# Patient Record
Sex: Female | Born: 1955 | Race: White | Hispanic: No | Marital: Single | Smoking: Former smoker
Health system: Southern US, Community
[De-identification: ages and names within clinical notes are randomized; demographics above are authoritative.]

## PROBLEM LIST (undated history)

## (undated) DIAGNOSIS — Z8489 Family history of other specified conditions: Secondary | ICD-10-CM

## (undated) DIAGNOSIS — D369 Benign neoplasm, unspecified site: Secondary | ICD-10-CM

## (undated) DIAGNOSIS — I10 Essential (primary) hypertension: Secondary | ICD-10-CM

## (undated) DIAGNOSIS — M545 Low back pain, unspecified: Secondary | ICD-10-CM

## (undated) DIAGNOSIS — J939 Pneumothorax, unspecified: Secondary | ICD-10-CM

## (undated) DIAGNOSIS — Q8501 Neurofibromatosis, type 1: Secondary | ICD-10-CM

## (undated) DIAGNOSIS — H269 Unspecified cataract: Secondary | ICD-10-CM

## (undated) DIAGNOSIS — T7840XA Allergy, unspecified, initial encounter: Secondary | ICD-10-CM

## (undated) DIAGNOSIS — C801 Malignant (primary) neoplasm, unspecified: Secondary | ICD-10-CM

## (undated) DIAGNOSIS — D3A098 Benign carcinoid tumors of other sites: Secondary | ICD-10-CM

## (undated) HISTORY — DX: Benign carcinoid tumors of other sites: D3A.098

## (undated) HISTORY — PX: CHOLECYSTECTOMY: SHX55

## (undated) HISTORY — DX: Low back pain: M54.5

## (undated) HISTORY — PX: WHIPPLE PROCEDURE: SHX2667

## (undated) HISTORY — DX: Neurofibromatosis, type 1: Q85.01

## (undated) HISTORY — PX: LASIK: SHX215

## (undated) HISTORY — DX: Unspecified cataract: H26.9

## (undated) HISTORY — DX: Benign neoplasm, unspecified site: D36.9

## (undated) HISTORY — PX: LUNG SURGERY: SHX703

## (undated) HISTORY — DX: Low back pain, unspecified: M54.50

## (undated) HISTORY — PX: OOPHORECTOMY: SHX86

## (undated) HISTORY — PX: TONSILLECTOMY: SUR1361

## (undated) HISTORY — DX: Allergy, unspecified, initial encounter: T78.40XA

---

## 1997-04-23 HISTORY — PX: ABDOMINAL HYSTERECTOMY: SHX81

## 1997-07-30 ENCOUNTER — Inpatient Hospital Stay (HOSPITAL_COMMUNITY): Admission: RE | Admit: 1997-07-30 | Discharge: 1997-08-01 | Payer: Self-pay | Admitting: *Deleted

## 1998-04-26 ENCOUNTER — Other Ambulatory Visit: Admission: RE | Admit: 1998-04-26 | Discharge: 1998-04-26 | Payer: Self-pay | Admitting: *Deleted

## 1999-05-12 ENCOUNTER — Other Ambulatory Visit: Admission: RE | Admit: 1999-05-12 | Discharge: 1999-05-12 | Payer: Self-pay | Admitting: *Deleted

## 2000-04-24 ENCOUNTER — Other Ambulatory Visit: Admission: RE | Admit: 2000-04-24 | Discharge: 2000-04-24 | Payer: Self-pay | Admitting: *Deleted

## 2001-06-10 ENCOUNTER — Encounter: Payer: Self-pay | Admitting: Surgery

## 2001-06-10 ENCOUNTER — Encounter (INDEPENDENT_AMBULATORY_CARE_PROVIDER_SITE_OTHER): Payer: Self-pay | Admitting: Specialist

## 2001-06-10 ENCOUNTER — Inpatient Hospital Stay (HOSPITAL_COMMUNITY): Admission: EM | Admit: 2001-06-10 | Discharge: 2001-06-27 | Payer: Self-pay

## 2001-06-11 ENCOUNTER — Encounter: Payer: Self-pay | Admitting: Surgery

## 2001-06-12 ENCOUNTER — Encounter: Payer: Self-pay | Admitting: Surgery

## 2001-06-13 ENCOUNTER — Encounter: Payer: Self-pay | Admitting: Surgery

## 2001-06-13 ENCOUNTER — Encounter: Payer: Self-pay | Admitting: Thoracic Surgery

## 2001-06-14 ENCOUNTER — Encounter: Payer: Self-pay | Admitting: Thoracic Surgery

## 2001-06-15 ENCOUNTER — Encounter: Payer: Self-pay | Admitting: Thoracic Surgery

## 2001-06-16 ENCOUNTER — Encounter: Payer: Self-pay | Admitting: Thoracic Surgery

## 2001-06-17 ENCOUNTER — Encounter: Payer: Self-pay | Admitting: Thoracic Surgery

## 2001-06-18 ENCOUNTER — Encounter: Payer: Self-pay | Admitting: Thoracic Surgery

## 2001-06-19 ENCOUNTER — Encounter: Payer: Self-pay | Admitting: Thoracic Surgery

## 2001-06-20 ENCOUNTER — Encounter: Payer: Self-pay | Admitting: Thoracic Surgery

## 2001-06-21 ENCOUNTER — Encounter: Payer: Self-pay | Admitting: Thoracic Surgery

## 2001-06-22 ENCOUNTER — Encounter: Payer: Self-pay | Admitting: Thoracic Surgery

## 2001-06-23 ENCOUNTER — Encounter: Payer: Self-pay | Admitting: Thoracic Surgery

## 2001-06-24 ENCOUNTER — Encounter: Payer: Self-pay | Admitting: Thoracic Surgery

## 2001-06-25 ENCOUNTER — Encounter: Payer: Self-pay | Admitting: Thoracic Surgery

## 2001-06-26 ENCOUNTER — Encounter: Payer: Self-pay | Admitting: Thoracic Surgery

## 2001-06-27 ENCOUNTER — Encounter: Payer: Self-pay | Admitting: Thoracic Surgery

## 2001-07-01 ENCOUNTER — Encounter: Payer: Self-pay | Admitting: Thoracic Surgery

## 2001-07-01 ENCOUNTER — Encounter: Admission: RE | Admit: 2001-07-01 | Discharge: 2001-07-01 | Payer: Self-pay | Admitting: Thoracic Surgery

## 2001-07-07 ENCOUNTER — Encounter: Admission: RE | Admit: 2001-07-07 | Discharge: 2001-07-07 | Payer: Self-pay | Admitting: Thoracic Surgery

## 2001-07-07 ENCOUNTER — Encounter: Payer: Self-pay | Admitting: Thoracic Surgery

## 2001-07-08 ENCOUNTER — Encounter: Payer: Self-pay | Admitting: Thoracic Surgery

## 2001-07-08 ENCOUNTER — Encounter: Admission: RE | Admit: 2001-07-08 | Discharge: 2001-07-08 | Payer: Self-pay | Admitting: Thoracic Surgery

## 2001-07-15 ENCOUNTER — Encounter: Payer: Self-pay | Admitting: Thoracic Surgery

## 2001-07-15 ENCOUNTER — Encounter: Admission: RE | Admit: 2001-07-15 | Discharge: 2001-07-15 | Payer: Self-pay | Admitting: Thoracic Surgery

## 2001-07-22 ENCOUNTER — Encounter: Payer: Self-pay | Admitting: Thoracic Surgery

## 2001-07-22 ENCOUNTER — Encounter: Admission: RE | Admit: 2001-07-22 | Discharge: 2001-07-22 | Payer: Self-pay | Admitting: Thoracic Surgery

## 2001-08-05 ENCOUNTER — Encounter: Payer: Self-pay | Admitting: Thoracic Surgery

## 2001-08-05 ENCOUNTER — Encounter: Admission: RE | Admit: 2001-08-05 | Discharge: 2001-08-05 | Payer: Self-pay | Admitting: Thoracic Surgery

## 2001-08-14 ENCOUNTER — Other Ambulatory Visit: Admission: RE | Admit: 2001-08-14 | Discharge: 2001-08-14 | Payer: Self-pay | Admitting: *Deleted

## 2001-09-16 ENCOUNTER — Encounter: Payer: Self-pay | Admitting: Thoracic Surgery

## 2001-09-16 ENCOUNTER — Encounter: Admission: RE | Admit: 2001-09-16 | Discharge: 2001-09-16 | Payer: Self-pay | Admitting: Thoracic Surgery

## 2001-09-19 ENCOUNTER — Encounter (INDEPENDENT_AMBULATORY_CARE_PROVIDER_SITE_OTHER): Payer: Self-pay | Admitting: *Deleted

## 2001-09-19 ENCOUNTER — Inpatient Hospital Stay (HOSPITAL_COMMUNITY): Admission: EM | Admit: 2001-09-19 | Discharge: 2001-09-29 | Payer: Self-pay

## 2001-09-20 ENCOUNTER — Encounter: Payer: Self-pay | Admitting: Thoracic Surgery

## 2001-09-21 ENCOUNTER — Encounter: Payer: Self-pay | Admitting: Thoracic Surgery

## 2001-09-22 ENCOUNTER — Encounter: Payer: Self-pay | Admitting: Thoracic Surgery

## 2001-09-23 ENCOUNTER — Encounter: Payer: Self-pay | Admitting: Thoracic Surgery

## 2001-09-24 ENCOUNTER — Encounter: Payer: Self-pay | Admitting: Thoracic Surgery

## 2001-09-25 ENCOUNTER — Encounter: Payer: Self-pay | Admitting: Thoracic Surgery

## 2001-09-26 ENCOUNTER — Encounter: Payer: Self-pay | Admitting: Thoracic Surgery

## 2001-09-27 ENCOUNTER — Encounter: Payer: Self-pay | Admitting: Thoracic Surgery

## 2001-09-28 ENCOUNTER — Encounter: Payer: Self-pay | Admitting: Thoracic Surgery

## 2001-09-29 ENCOUNTER — Encounter: Payer: Self-pay | Admitting: Thoracic Surgery

## 2001-10-08 ENCOUNTER — Encounter: Payer: Self-pay | Admitting: Thoracic Surgery

## 2001-10-08 ENCOUNTER — Encounter: Admission: RE | Admit: 2001-10-08 | Discharge: 2001-10-08 | Payer: Self-pay | Admitting: Thoracic Surgery

## 2001-11-05 ENCOUNTER — Encounter: Payer: Self-pay | Admitting: Thoracic Surgery

## 2001-11-05 ENCOUNTER — Encounter: Admission: RE | Admit: 2001-11-05 | Discharge: 2001-11-05 | Payer: Self-pay | Admitting: Thoracic Surgery

## 2002-01-06 ENCOUNTER — Encounter: Admission: RE | Admit: 2002-01-06 | Discharge: 2002-01-06 | Payer: Self-pay | Admitting: Thoracic Surgery

## 2002-01-06 ENCOUNTER — Encounter: Payer: Self-pay | Admitting: Thoracic Surgery

## 2002-07-09 ENCOUNTER — Encounter: Payer: Self-pay | Admitting: Thoracic Surgery

## 2002-07-09 ENCOUNTER — Encounter: Admission: RE | Admit: 2002-07-09 | Discharge: 2002-07-09 | Payer: Self-pay | Admitting: Thoracic Surgery

## 2002-08-27 ENCOUNTER — Other Ambulatory Visit: Admission: RE | Admit: 2002-08-27 | Discharge: 2002-08-27 | Payer: Self-pay | Admitting: *Deleted

## 2003-10-01 ENCOUNTER — Other Ambulatory Visit: Admission: RE | Admit: 2003-10-01 | Discharge: 2003-10-01 | Payer: Self-pay | Admitting: Obstetrics and Gynecology

## 2004-04-23 HISTORY — PX: COLONOSCOPY: SHX174

## 2004-10-27 ENCOUNTER — Other Ambulatory Visit: Admission: RE | Admit: 2004-10-27 | Discharge: 2004-10-27 | Payer: Self-pay | Admitting: Obstetrics and Gynecology

## 2004-12-01 ENCOUNTER — Encounter: Payer: Self-pay | Admitting: Family Medicine

## 2004-12-01 LAB — HM COLONOSCOPY

## 2005-10-11 ENCOUNTER — Encounter: Admission: RE | Admit: 2005-10-11 | Discharge: 2005-10-11 | Payer: Self-pay | Admitting: Internal Medicine

## 2009-11-01 ENCOUNTER — Ambulatory Visit: Payer: Self-pay | Admitting: Family Medicine

## 2009-11-01 DIAGNOSIS — Q8501 Neurofibromatosis, type 1: Secondary | ICD-10-CM | POA: Insufficient documentation

## 2009-11-01 DIAGNOSIS — R1011 Right upper quadrant pain: Secondary | ICD-10-CM | POA: Insufficient documentation

## 2009-11-01 DIAGNOSIS — M899 Disorder of bone, unspecified: Secondary | ICD-10-CM | POA: Insufficient documentation

## 2009-11-01 DIAGNOSIS — F17200 Nicotine dependence, unspecified, uncomplicated: Secondary | ICD-10-CM | POA: Insufficient documentation

## 2009-11-01 DIAGNOSIS — M949 Disorder of cartilage, unspecified: Secondary | ICD-10-CM

## 2009-11-03 ENCOUNTER — Telehealth (INDEPENDENT_AMBULATORY_CARE_PROVIDER_SITE_OTHER): Payer: Self-pay | Admitting: *Deleted

## 2009-11-03 DIAGNOSIS — R740 Nonspecific elevation of levels of transaminase and lactic acid dehydrogenase [LDH]: Secondary | ICD-10-CM

## 2009-11-03 DIAGNOSIS — R7401 Elevation of levels of liver transaminase levels: Secondary | ICD-10-CM | POA: Insufficient documentation

## 2009-11-03 LAB — CONVERTED CEMR LAB
ALT: 19 units/L (ref 0–35)
AST: 29 units/L (ref 0–37)
Albumin: 3.9 g/dL (ref 3.5–5.2)
Alkaline Phosphatase: 82 units/L (ref 39–117)
Bilirubin, Direct: 0.6 mg/dL — ABNORMAL HIGH (ref 0.0–0.3)
Total Bilirubin: 2.3 mg/dL — ABNORMAL HIGH (ref 0.3–1.2)
Total Protein: 7.3 g/dL (ref 6.0–8.3)

## 2009-11-08 ENCOUNTER — Telehealth (INDEPENDENT_AMBULATORY_CARE_PROVIDER_SITE_OTHER): Payer: Self-pay | Admitting: *Deleted

## 2009-11-08 ENCOUNTER — Encounter: Admission: RE | Admit: 2009-11-08 | Discharge: 2009-11-08 | Payer: Self-pay | Admitting: Family Medicine

## 2009-11-09 ENCOUNTER — Ambulatory Visit: Payer: Self-pay | Admitting: Cardiology

## 2009-11-11 ENCOUNTER — Telehealth (INDEPENDENT_AMBULATORY_CARE_PROVIDER_SITE_OTHER): Payer: Self-pay

## 2009-11-11 ENCOUNTER — Encounter (INDEPENDENT_AMBULATORY_CARE_PROVIDER_SITE_OTHER): Payer: Self-pay | Admitting: *Deleted

## 2009-11-11 ENCOUNTER — Telehealth: Payer: Self-pay | Admitting: Family Medicine

## 2009-11-15 ENCOUNTER — Encounter (INDEPENDENT_AMBULATORY_CARE_PROVIDER_SITE_OTHER): Payer: Self-pay | Admitting: *Deleted

## 2009-11-15 ENCOUNTER — Ambulatory Visit: Payer: Self-pay | Admitting: Gastroenterology

## 2009-11-15 DIAGNOSIS — R933 Abnormal findings on diagnostic imaging of other parts of digestive tract: Secondary | ICD-10-CM | POA: Insufficient documentation

## 2009-11-16 LAB — CONVERTED CEMR LAB
ALT: 12 units/L (ref 0–35)
AST: 17 units/L (ref 0–37)
Albumin: 4.2 g/dL (ref 3.5–5.2)
Alkaline Phosphatase: 84 units/L (ref 39–117)
BUN: 7 mg/dL (ref 6–23)
Basophils Absolute: 0.1 10*3/uL (ref 0.0–0.1)
Basophils Relative: 0.4 % (ref 0.0–3.0)
CO2: 27 meq/L (ref 19–32)
Calcium: 9.5 mg/dL (ref 8.4–10.5)
Chloride: 103 meq/L (ref 96–112)
Creatinine, Ser: 0.8 mg/dL (ref 0.4–1.2)
Eosinophils Absolute: 0.1 10*3/uL (ref 0.0–0.7)
Eosinophils Relative: 0.7 % (ref 0.0–5.0)
GFR calc non Af Amer: 81.2 mL/min (ref >60)
Glucose, Bld: 101 mg/dL — ABNORMAL HIGH (ref 70–99)
HCT: 43.5 % (ref 36.0–46.0)
Hemoglobin: 14.8 g/dL (ref 12.0–15.0)
Lymphocytes Relative: 29.5 % (ref 12.0–46.0)
Lymphs Abs: 4 10*3/uL (ref 0.7–4.0)
MCHC: 33.9 g/dL (ref 30.0–36.0)
MCV: 86.7 fL (ref 78.0–100.0)
Monocytes Absolute: 1.2 10*3/uL — ABNORMAL HIGH (ref 0.1–1.0)
Monocytes Relative: 9.1 % (ref 3.0–12.0)
Neutro Abs: 8.2 10*3/uL — ABNORMAL HIGH (ref 1.4–7.7)
Neutrophils Relative %: 60.3 % (ref 43.0–77.0)
Platelets: 453 10*3/uL — ABNORMAL HIGH (ref 150.0–400.0)
Potassium: 4.9 meq/L (ref 3.5–5.1)
RBC: 5.02 M/uL (ref 3.87–5.11)
RDW: 14.5 % (ref 11.5–14.6)
Sodium: 138 meq/L (ref 135–145)
Total Bilirubin: 0.5 mg/dL (ref 0.3–1.2)
Total Protein: 8 g/dL (ref 6.0–8.3)
WBC: 13.6 10*3/uL — ABNORMAL HIGH (ref 4.5–10.5)

## 2009-11-22 ENCOUNTER — Telehealth (INDEPENDENT_AMBULATORY_CARE_PROVIDER_SITE_OTHER): Payer: Self-pay | Admitting: *Deleted

## 2009-11-22 ENCOUNTER — Ambulatory Visit: Payer: Self-pay | Admitting: Family Medicine

## 2009-11-22 DIAGNOSIS — G4700 Insomnia, unspecified: Secondary | ICD-10-CM | POA: Insufficient documentation

## 2009-11-23 ENCOUNTER — Telehealth (INDEPENDENT_AMBULATORY_CARE_PROVIDER_SITE_OTHER): Payer: Self-pay | Admitting: *Deleted

## 2009-11-23 ENCOUNTER — Encounter: Admission: RE | Admit: 2009-11-23 | Discharge: 2009-11-23 | Payer: Self-pay | Admitting: Family Medicine

## 2009-11-23 ENCOUNTER — Encounter: Payer: Self-pay | Admitting: Family Medicine

## 2009-11-23 LAB — HM MAMMOGRAPHY

## 2009-11-24 ENCOUNTER — Ambulatory Visit (HOSPITAL_COMMUNITY): Admission: RE | Admit: 2009-11-24 | Discharge: 2009-11-24 | Payer: Self-pay | Admitting: Gastroenterology

## 2009-11-24 ENCOUNTER — Ambulatory Visit: Payer: Self-pay | Admitting: Gastroenterology

## 2009-11-24 LAB — HM PAP SMEAR

## 2009-11-25 ENCOUNTER — Telehealth (INDEPENDENT_AMBULATORY_CARE_PROVIDER_SITE_OTHER): Payer: Self-pay | Admitting: *Deleted

## 2009-11-25 ENCOUNTER — Ambulatory Visit: Payer: Self-pay | Admitting: Family Medicine

## 2009-11-25 LAB — CONVERTED CEMR LAB
BUN: 10 mg/dL (ref 6–23)
CO2: 28 meq/L (ref 19–32)
Calcium: 9.6 mg/dL (ref 8.4–10.5)
Chloride: 105 meq/L (ref 96–112)
Creatinine, Ser: 0.8 mg/dL (ref 0.4–1.2)
GFR calc non Af Amer: 82.41 mL/min (ref >60)
Glucose, Bld: 101 mg/dL — ABNORMAL HIGH (ref 70–99)
Potassium: 5.7 meq/L — ABNORMAL HIGH (ref 3.5–5.1)
Sodium: 142 meq/L (ref 135–145)

## 2009-11-29 ENCOUNTER — Telehealth: Payer: Self-pay | Admitting: Family Medicine

## 2009-11-30 DIAGNOSIS — Z8509 Personal history of malignant neoplasm of other digestive organs: Secondary | ICD-10-CM | POA: Insufficient documentation

## 2009-12-07 ENCOUNTER — Encounter (HOSPITAL_COMMUNITY): Admission: RE | Admit: 2009-12-07 | Discharge: 2009-12-08 | Payer: Self-pay | Admitting: Gastroenterology

## 2009-12-15 ENCOUNTER — Encounter: Payer: Self-pay | Admitting: Gastroenterology

## 2009-12-21 ENCOUNTER — Telehealth (INDEPENDENT_AMBULATORY_CARE_PROVIDER_SITE_OTHER): Payer: Self-pay | Admitting: *Deleted

## 2009-12-21 ENCOUNTER — Encounter: Payer: Self-pay | Admitting: Family Medicine

## 2009-12-30 ENCOUNTER — Encounter (INDEPENDENT_AMBULATORY_CARE_PROVIDER_SITE_OTHER): Payer: Self-pay | Admitting: *Deleted

## 2010-01-02 ENCOUNTER — Telehealth: Payer: Self-pay | Admitting: Gastroenterology

## 2010-01-04 ENCOUNTER — Encounter (INDEPENDENT_AMBULATORY_CARE_PROVIDER_SITE_OTHER): Payer: Self-pay | Admitting: General Surgery

## 2010-01-04 ENCOUNTER — Inpatient Hospital Stay (HOSPITAL_COMMUNITY): Admission: AD | Admit: 2010-01-04 | Discharge: 2010-01-15 | Payer: Self-pay | Admitting: General Surgery

## 2010-01-31 ENCOUNTER — Encounter: Payer: Self-pay | Admitting: Gastroenterology

## 2010-02-01 ENCOUNTER — Ambulatory Visit: Payer: Self-pay | Admitting: Oncology

## 2010-02-16 ENCOUNTER — Encounter: Payer: Self-pay | Admitting: Gastroenterology

## 2010-02-28 ENCOUNTER — Encounter: Payer: Self-pay | Admitting: Family Medicine

## 2010-04-27 ENCOUNTER — Encounter: Payer: Self-pay | Admitting: Family Medicine

## 2010-05-14 ENCOUNTER — Encounter: Payer: Self-pay | Admitting: Family Medicine

## 2010-05-21 LAB — CONVERTED CEMR LAB
BUN: 9 mg/dL (ref 6–23)
Basophils Absolute: 0.1 10*3/uL (ref 0.0–0.1)
Basophils Relative: 0.4 % (ref 0.0–3.0)
CO2: 20 meq/L (ref 19–32)
Calcium: 10.6 mg/dL — ABNORMAL HIGH (ref 8.4–10.5)
Chloride: 105 meq/L (ref 96–112)
Cholesterol: 205 mg/dL — ABNORMAL HIGH (ref 0–200)
Creatinine, Ser: 0.82 mg/dL (ref 0.40–1.20)
Direct LDL: 148 mg/dL
Eosinophils Absolute: 0.1 10*3/uL (ref 0.0–0.7)
Eosinophils Relative: 1 % (ref 0.0–5.0)
Glucose, Bld: 94 mg/dL (ref 70–99)
HCT: 43.4 % (ref 36.0–46.0)
HDL: 46.2 mg/dL (ref >39.00)
Hemoglobin: 14.5 g/dL (ref 12.0–15.0)
Lymphocytes Relative: 31.6 % (ref 12.0–46.0)
Lymphs Abs: 4 10*3/uL (ref 0.7–4.0)
MCHC: 33.3 g/dL (ref 30.0–36.0)
MCV: 88.5 fL (ref 78.0–100.0)
Monocytes Absolute: 1 10*3/uL (ref 0.1–1.0)
Monocytes Relative: 8.2 % (ref 3.0–12.0)
Neutro Abs: 7.5 10*3/uL (ref 1.4–7.7)
Neutrophils Relative %: 58.8 % (ref 43.0–77.0)
Platelets: 399 10*3/uL (ref 150.0–400.0)
Potassium: 6.2 meq/L — ABNORMAL HIGH (ref 3.5–5.3)
RBC: 4.91 M/uL (ref 3.87–5.11)
RDW: 14.8 % — ABNORMAL HIGH (ref 11.5–14.6)
Sodium: 143 meq/L (ref 135–145)
TSH: 2.34 microintl units/mL (ref 0.35–5.50)
Total CHOL/HDL Ratio: 4
Triglycerides: 104 mg/dL (ref 0.0–149.0)
VLDL: 20.8 mg/dL (ref 0.0–40.0)
Vit D, 25-Hydroxy: 38 ng/mL (ref 30–89)
WBC: 12.7 10*3/uL — ABNORMAL HIGH (ref 4.5–10.5)

## 2010-05-23 NOTE — Progress Notes (Signed)
Summary: MEDICAL RECORDS FROM [facility] TO REVIEW  Phone Note Call from Patient   Caller: Daughter Summary of Call: PATIENT'S DAUGHTER DROPPED OFF RECORDS FROM [facility] FOR DR Beverely Low TO REVIEW--WILL TAKE TO  CHEMIRA IN PLASTIC SLEEVE Initial call taken by: Jerolyn Shin,  December 21, 2009 3:03 PM

## 2010-05-23 NOTE — Procedures (Signed)
Summary: Endoscopic Ultrasound  Patient: [name] [name] Note: All result statuses are Final unless otherwise noted.  Tests: (1) Endoscopic Ultrasound (EUS)  EUS Endoscopic Ultrasound                             DONE     [facility]     [address] [place], [place]  [address]           ENDOSCOPIC ULTRASOUND PROCEDURE REPORT           PATIENT:  [name] [name], [name] [name]  MR#:  [id number]     BIRTHDATE:  06/05/1955  GENDER:  female     ENDOSCOPIST:  Rachael Fee, MD     REFERRED BY:  Helane Rima. Beverely Low, M.D.     PROCEDURE DATE:  11/24/2009     PROCEDURE:  Upper EUS w/FNA     ASA CLASS:  Class II     INDICATIONS:  intermittent RUQ pain, transiently elevated LFTs;     double duct sign on CT; no clear pancreatic masses; pt has     neurofibromatosis     MEDICATIONS:   Fentanyl 125 mcg IV, Versed 16 mg IV           DESCRIPTION OF PROCEDURE:   After the risks benefits and     alternatives of the procedure were  explained, informed consent     was obtained. The patient was then placed in the left, lateral,     decubitus postion and IV sedation was administered. Throughout the     procedure, the patient's blood pressure, pulse and oxygen     saturations were monitored continuously.  Under direct     visualization, the  endoscope was introduced through the  and     advanced to the .  Water was used as necessary to provide an     acoustic interface.  Upon completion of the imaging, water was     removed and the patient was sent to the recovery room in     satisfactory condition.           <<PROCEDUREIMAGES>>           Endoscopic findings:     1. Normal esophagus     2. Normal stomach     3. Abnormal mucosa in duodenum, appearing somewhat adenomatous in     periampullary region without obvious mass. Mucosal biopsies taken     and sent to pathology.           EUS findings:     1. Dilated CBD, up to 16mm without stones. This tapered rather     abruptly in head of pancreas.   2. Dilated main pancreatic duct up to 5.19mm in body. This     continued into the head of pancreas.     3. Gallbladder was normal.     4. The parenchyma in head of pancreas/perimpullary region was     slightly abnormal appearing, but not clearly mass-like.  2 passes     with a 25 guage BS EUS FNA needle were performed to sample the     area.     5. No peripancreatic adenopathy.     6. 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 The parenchyma in the     head/periampullary region was slightly abnormal appearing (more     hypoechoic than usual) and it was sampled with EUS FNA.  The     duodenal mucosa was abnormal, looked adenomatous in periampullary     region, this was biopsied with forceps.  Will await final cytology     and pathology.  She may need further imaging (pancreatic     MRI/MRCP).           ______________________________     Rachael Fee, MD           n.     eSIGNED:   Rachael Fee at 11/24/2009 12:01 PM           Mathis Dad, [id number]  Note: An exclamation mark (!) indicates a result that was not dispersed into the flowsheet. Document Creation Date: 11/24/2009 12:01 PM _______________________________________________________________________  (1) Order result status: Final Collection or observation date-time: 11/24/2009 11:43 Requested date-time:  Receipt date-time:  Reported date-time:  Referring Physician:   Ordering Physician: Rob Bunting [phone]) Specimen Source:  Source: Launa Grill Order Number: [id number] Lab site:

## 2010-05-23 NOTE — Letter (Signed)
Summary: Records Dated 12-24-01 thru 02-11-06/[facility]  Records Dated 12-24-01 thru 02-11-06/[facility]   Imported By: Lanelle Bal 01/05/2010 13:10:03  _____________________________________________________________________  External Attachment:    Type:   Image     Comment:   External Document

## 2010-05-23 NOTE — Miscellaneous (Signed)
  Clinical Lists Changes  Observations: Added new observation of COLONOSCOPY: Diverticulosis (12/01/2004 9:41)      Preventive Care Screening  Colonoscopy:    Date:  12/01/2004    Results:  Diverticulosis

## 2010-05-23 NOTE — Letter (Signed)
Summary: [facility]   Imported By: Lanelle Bal 03/15/2010 12:00:32  _____________________________________________________________________  External Attachment:    Type:   Image     Comment:   External Document

## 2010-05-23 NOTE — Progress Notes (Signed)
Summary: labs and bone density results  Phone Note Outgoing Call   Call placed by: Doristine Devoid CMA,  November 25, 2009 4:07 PM Call placed to: Patient Summary of Call: K+ is trending down but still slightly elevated.  should avoid high K+ foods such as citrus, OJ, bananas.  follow up in 1 week   Follow-up for Phone Call        pt w/ osteopenia.  should increase caltrate to two times a day.  given pt's mildly elevated Ca on recent labs should stick w/ caltrate daily  spoke w/ patient aware of labs and instructions.....Marland KitchenMarland KitchenDoristine Devoid CMA  November 25, 2009 4:08 PM     New/Updated Medications: CALTRATE 600 1500 MG TABS (CALCIUM CARBONATE) take two times a day

## 2010-05-23 NOTE — Letter (Signed)
Summary: EGD Instructions  [facility]  [address] [place], [place] [address]   Phone: [phone]  Fax: [phone]       CELE MOTE    April 14, 1956    MRN: [id number]       Procedure Day /Date:11/24/09  THURS     Arrival Time: 930 am     Procedure Time:1030 am     Location of Procedure:                     [facility] ( Outpatient Registration)    PREPARATION FOR ENDOSCOPY   On 11/24/09  THE DAY OF THE PROCEDURE:  1.   No solid foods, milk or milk products are allowed after midnight the night before your procedure.  2.   Do not drink anything colored red or purple.  Avoid juices with pulp.  No orange juice.  3.  You may drink clear liquids until 630 am , which is 4 hours before your procedure.                                                                                                CLEAR LIQUIDS INCLUDE: Water Jello Ice Popsicles Tea (sugar ok, no milk/cream) Powdered fruit flavored drinks Coffee (sugar ok, no milk/cream) Gatorade Juice: apple, white grape, white cranberry  Lemonade Clear bullion, consomm, broth Carbonated beverages (any kind) Strained chicken noodle soup Hard Candy   MEDICATION INSTRUCTIONS  Unless otherwise instructed, you should take regular prescription medications with a small sip of water as early as possible the morning of your procedure.               OTHER INSTRUCTIONS  You will need a responsible adult at least 57 years of age to accompany you and drive you home.   This person must remain in the waiting room during your procedure.  Wear loose fitting clothing that is easily removed.  Leave jewelry and other valuables at home.  However, you may wish to bring a book to read or an iPod/MP3 player to listen to music as you wait for your procedure to start.  Remove all body piercing jewelry and leave at home.  Total time from sign-in until discharge is approximately 2-3 hours.  You should go  home directly after your procedure and rest.  You can resume normal activities the day after your procedure.  The day of your procedure you should not:   Drive   Make legal decisions   Operate machinery   Drink alcohol   Return to work  You will receive specific instructions about eating, activities and medications before you leave.    The above instructions have been reviewed and explained to me by   _______________________    I fully understand and can verbalize these instructions _____________________________ Date _________

## 2010-05-23 NOTE — Miscellaneous (Signed)
Summary: Orders Update   Clinical Lists Changes  Orders: Added new Referral order of Gastroenterology Referral (GI) - Signed 

## 2010-05-23 NOTE — Progress Notes (Signed)
Summary: labs  Phone Note Outgoing Call   Call placed by: Doristine Devoid CMA,  November 23, 2009 11:33 AM Call placed to: Patient Summary of Call: K+ is high.  needs to repeat labs on Thurs or Friday to trend level.  this is odd b/c level was normal when checked at GI and EKG was normal.  total cholesterol and LDL are both mildly elevated.  should focus on diet and exercise for the next 6 month and then recheck labs.  if no improvement in 6 months will start meds.  WBC has improved since last week- is trending down  Follow-up for Phone Call        spoke w/ patient aware of labs and appt scheduled to recheck potassium.........Marland KitchenDoristine Devoid CMA  November 23, 2009 11:36 AM

## 2010-05-23 NOTE — Procedures (Signed)
Summary: Colonoscopy/[facility]  Colonoscopy/[facility]   Imported By: Lanelle Bal 01/09/2010 12:26:00  _____________________________________________________________________  External Attachment:    Type:   Image     Comment:   External Document

## 2010-05-23 NOTE — Progress Notes (Signed)
Summary: ultrasound results   Phone Note Outgoing Call   Call placed by: Doristine Devoid CMA,  November 08, 2009 12:06 PM Call placed to: Patient Summary of Call: based on US findings they are recommending a CT of abd w/ contrast.  pt has distended gall bladder and common bile duct but no stones were visualized.  CT will give [place] more info.

## 2010-05-23 NOTE — Progress Notes (Signed)
Summary: triage  Phone Note From Other Clinic Call back at [phone]   Caller: Marisue Ivan, scheduler Call For: Dr. Marina Goodell (doctor of the day) Reason for Call: Schedule Patient Appt Summary of Call: Dr. Beverely Low would like pt worked in for abd pain and poss pancreatic mass... no GI hx Initial call taken by: Vallarie Mare,  November 11, 2009 3:13 PM  Follow-up for Phone Call        [facility] with Dr.Tabori to have her seen on Tuesday by PA.Her office  will contact pt. with appt. time  Follow-up by: Teryl Lucy RN,  November 11, 2009 3:33 PM     Appended Document: triage Patient  is rescheduled to see Dr Kennedy Bucker 11/15/09 2:45

## 2010-05-23 NOTE — Procedures (Signed)
Summary: Instructions for procedure/[facility]  Instructions for procedure/[facility]   Imported By: Sherian Rein 11/17/2009 08:40:53  _____________________________________________________________________  External Attachment:    Type:   Image     Comment:   External Document

## 2010-05-23 NOTE — Consult Note (Signed)
Summary: [facility]   Imported By: Lennie Odor 12/27/2009 10:52:03  _____________________________________________________________________  External Attachment:    Type:   Image     Comment:   External Document

## 2010-05-23 NOTE — Progress Notes (Signed)
Summary: Explanation for lab delay  Phone Note Call from Patient Call back at [phone]   Caller: Patient Call For: Neena Rhymes MD Summary of Call: Patient wants to delay having her potassium rechecked because of other medical issues.  Please call for further explanation. Initial call taken by: Barnie Mort,  November 29, 2009 9:56 AM  Follow-up for Phone Call        ok w/ delaying labs.  will call pt. Follow-up by: Neena Rhymes MD,  November 29, 2009 10:01 AM  Additional Follow-up for Phone Call Additional follow up Details #1::        pt recently dx'd w/ carcinoid cancer.  has upcoming octreotide scan and appt w/ surgeon.  reports she is handling the news well and is hopeful b/c it is treatable.  offered our support and told her to call if she needs anything. Additional Follow-up by: Neena Rhymes MD,  November 30, 2009 12:48 PM

## 2010-05-23 NOTE — Assessment & Plan Note (Signed)
Summary: new to estab/cbs   Vital Signs:  Patient profile:   55 year old female Height:      63 inches Weight:      123 pounds BMI:     21.87 Pulse rate:   82 / minute BP sitting:   140 / 80  (left arm)  Vitals Entered By: Doristine Devoid (November 01, 2009 10:17 AM) CC: NEW EST- questions gallbladder occassional discomfort   History of Present Illness: 55 yo woman here today to establish care.  previous MD- [facility].  GYN- [facility].  1) Gallbladder- intermittant RUQ pain.  will last for 5 minutes, described as sharp.  not sure if it occurs w/ eating.  occurs once every 1-2 weeks.  denies N/V/D.  2) Neurofibromatosis- has had lesions removed throughout life.  has yearly eye exam ([facility]).  no known nodules in the brain.  no hx of szs or neuro impairment.  3) Tobacco use- 1ppd.  started at age 58.  thinking about quitting.  4) Health Maintainence- UTD on colonoscopy, has not had mammogram or bone density in 3 yrs.  Preventive Screening-Counseling & Management  Alcohol-Tobacco     Alcohol drinks/day: <1     Smoking Status: current     Smoking Cessation Counseling: yes     Smoke Cessation Stage: contemplative     Packs/Day: 1.0  Caffeine-Diet-Exercise     Does Patient Exercise: no      Sexual History:  currently monogamous.        Drug Use:  never.    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 Lungs:  Normal respiratory effort, chest expands symmetrically. Lungs are clear to auscultation, no crackles or wheezes. Heart:  Normal rate and regular rhythm. S1 and S2 normal without gallop, murmur, click, rub or other extra sounds. Abdomen:  soft, NT/ND, +BS, no hepatosplenomegaly. Pulses:  +2 carotid, radial, DP Extremities:  no C/C/E Skin:  flesh colored nodules diffusely   Impression & Recommendations:  Problem # 1:  ABDOMINAL PAIN, RIGHT UPPER QUADRANT (ICD-789.01) Assessment New pt's pain intermittant and vague at this time.  doesn't sound gallbladder related as is pt's fear but will check LFTs.  abd exam WNL.  pt to keep better track of her sxs and will have additional discussion at upcoming CPE.  reviewed supportive care and red flags that should prompt return.  Pt expresses understanding and is in agreement w/ this plan. Orders: TLB-Hepatic/Liver Function Pnl ([id number])  Problem # 2:  NEUROFIBROMATOSIS (ICD-237.70) Assessment: New following w/ Dr Danella Deis.  gets yearly eye exams w/ Dr Hazle Quant.  has never had evidence of neuro involvement.  Problem # 3:  TOBACCO USE (ICD-305.1) Assessment: New has quit before but resumed smoking 1ppd.  reviewed high risk family hx and importance of smoking.  pt will think about setting a quit date.  will follow closely.  Problem #  4:  OSTEOPENIA (ICD-733.90) Assessment: New per pt report.  overdue for DEXA.  will refer. Orders: Radiology Referral (Radiology)  Patient Instructions: 1)  Please schedule your physical at your convenience- please don't eat before this appt 2)  We'll notify you of your lab results 3)  Please track your abdominal symptoms- when it occurs, what makes it better or worse, etc 4)  Someone will call you with your Mammogram and bone  density 5)  Call with any questions or concerns 6)  Welcome!  We're glad to have you!

## 2010-05-23 NOTE — Assessment & Plan Note (Signed)
History of Present Illness Visit Type: consult  Primary GI MD: Rob Bunting MD Primary Provider: Neena Rhymes, MD Requesting Provider: Neena Rhymes, MD Chief Complaint: abd pain  History of Present Illness:     very pleasant 55 year old woman who has had ruq pains,  LFTs were slightly elevated.   the pains have been going on for about 6 months.  The pain is intermittent, last 5 minutes at at time.  No nausea, vomiting, no fever, chills. The pain is sharp.  she had liver tests checked and her total bilirubin was 2.6, her direct was elevated at 0.6. The rest of her liver tests were normal. She had an abdominal ultrasound that suggested a dilated common bile duct. She ended up having a CAT scan of her abdomen and pelvis that showed dilated common duct and dilated pancreatic duct. There was no clear pancreatic mass.  Overall weight has been stable in past year.  No pancreatic disease in family.  Rarely drinks alcohol.           Current Medications (verified): 1)  Multivitamins   Tabs (Multiple Vitamin) .... One Tablet By Mouth Once Daily 2)  Caltrate 600 1500 Mg Tabs (Calcium Carbonate) .... 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  All other review of systems was otherwise negative.   Vital Signs:  Patient profile:   55 year old female Height:      63 inches Weight:      119 pounds BMI:     21.16 BSA:      1.55 Pulse rate:   96 / minute Pulse rhythm:   regular BP sitting:   138 / 84  (left arm) Cuff size:   regular  Vitals Entered By: Ok Anis CMA (November 15, 2009 2:32 PM)  Physical Exam  Additional Exam:  Constitutional: generally well appearing Psychiatric: alert and oriented times 3 Eyes: extraocular movements intact Mouth: oropharynx moist, no lesions Neck: supple, no lymphadenopathy Cardiovascular: heart regular rate and rythm Lungs: CTA bilaterally Abdomen: soft, non-tender, non-distended, no obvious ascites, no peritoneal signs, normal bowel sounds Extremities: no lower extremity edema bilaterally Skin: she has innumerable small fibromas on face arms extremities.    Impression & Recommendations:  Problem # 1:  dilated common duct, dilated main pancreatic duct she will be scheduled for an endoscopic ultrasound next week to get a very good look at the head of her pancreas, looking for tumors, stones, neoplasm. She has neurofibromatosis and I will have to look up whether this puts her at risk for more unusual tumors. Overall, she has not lost weight, CT scan is fairly underwhelming for any masses in the pancreas and I think the chances of her having pancreatic adenocarcinoma are fairly low. Will repeat some liver tests today as well as a CA 19-9 level  Other Orders: TLB-CMP (Comprehensive Metabolic Pnl) ([id number]) TLB-CBC Platelet - w/Differential ([id number]) T-CA 19-9 ([id number])  Patient Instructions: 1)  You will be scheduled for an endoscopic ultrasound procedure, next  Thursday. 2)  You will get lab test(s) done today (cmet, cbc, CA 19-9). 3)  The medication list was reviewed and reconciled.  All changed / newly prescribed medications were explained.  A complete medication list was provided to the patient / caregiver.  Appended Document: Orders Update/EUS    Clinical Lists Changes  Orders: Added new Test order of EUS-Upper (EUS-Upper) - Signed

## 2010-05-23 NOTE — Progress Notes (Signed)
Summary: labs  Phone Note Outgoing Call   Call placed by: Doristine Devoid CMA,  November 03, 2009 1:27 PM Call placed to: Patient Summary of Call: needs abd [place] to evaluate elevated bilirubin  Follow-up for Phone Call        left message on machine ...........Marland KitchenDoristine Devoid CMA  November 03, 2009 1:27 PM  patient aware already had ultrasound done...Marland KitchenMarland KitchenDoristine Devoid CMA  November 08, 2009 12:04 PM   New Problems: TRANSAMINASES, SERUM, ELEVATED (ICD-790.4)   New Problems: TRANSAMINASES, SERUM, ELEVATED (ICD-790.4)

## 2010-05-23 NOTE — Progress Notes (Signed)
Summary: WOULD LIKE CAT SCAN RESULTS CALLED TO HER TODAY  Phone Note Call from Patient Call back at [phone]  [facility]   Caller: Patient Summary of Call: PLEASE CALL HER ABOUT CAT SCAN RESULTS    WOULD LIKE A CALL BEFORE THIS WEEKEND PLEASE Initial call taken by: Jerolyn Shin,  November 11, 2009 2:45 PM  Follow-up for Phone Call        Please enter referral and will give all info to patient at one time. Lucious Groves CMA  November 11, 2009 3:02 PM   Order entered.Harold Barban  November 11, 2009 3:09 PM  Additional Follow-up for Phone Call Additional follow up Details #1::        Patient has appt 7-26 at 815 am with Mike Gip, PA supervised by Dr. Jarold Motto. [place] location.  Patient notified. Additional Follow-up by: Lucious Groves CMA,  November 11, 2009 3:31 PM

## 2010-05-23 NOTE — Letter (Signed)
Summary: [facility]   Imported By: Sherian Rein 02/15/2010 09:30:58  _____________________________________________________________________  External Attachment:    Type:   Image     Comment:   External Document

## 2010-05-23 NOTE — Progress Notes (Signed)
Summary: silenor prior auth-in process-  Phone Note Refill Request Call back at [phone] Message from:  Pharmacy on November 22, 2009 10:21 AM  Refills Requested: Medication #1:  SILENOR 6 MG TABS 1 tab by mouth daily.   Dosage confirmed as above?Dosage Confirmed   Supply Requested: 1 month PRIOR AUTH NEEDED: [phone] PATIENT ID: [id number] ----cell [id number]  Next Appointment Scheduled: AUGUST 4TH 2011 Initial call taken by: Lavell Islam,  November 22, 2009 10:22 AM  Follow-up for Phone Call        prior auth in process awaiting infomation to be fax..........Marland KitchenDoristine Devoid CMA  November 28, 2009 10:32 AM   Spoke with insurance company and they have not received fax form from our office. They will re-fax the form to our office.   The [employer] is handles by [employer] [place] # [phone] Lucious Groves CMA  December 06, 2009 1:41 PM   Additional Follow-up for Phone Call Additional follow up Details #1::        Preferred generics on fax form are Zolpidem and Zaleplon. Please advise. Lucious Groves CMA  December 06, 2009 4:03 PM       Additional Follow-up for Phone Call Additional follow up Details #2::    can try Zolpidem 5 mg at bedtime as needed for sleep.  #30, 3 refills.  (please tell pt this is not to be taken more than 3x/week).  also let her know that meds were changed due to her insurance company's rules Follow-up by: Neena Rhymes MD,  December 06, 2009 7:47 PM  Additional Follow-up for Phone Call Additional follow up Details #3:: Details for Additional Follow-up Action Taken: left message on machine ............Marland KitchenDoristine Devoid CMA  December 07, 2009 9:58 AM   left message on machine ........Marland KitchenDoristine Devoid CMA  December 13, 2009 1:47 PM   spoke w/ patient aware of change in medication..........Marland KitchenDoristine Devoid CMA  December 19, 2009 11:12 AM   New/Updated Medications: ZOLPIDEM TARTRATE 5 MG TABS (ZOLPIDEM TARTRATE) take one tablet at bedtime as needed Prescriptions: ZOLPIDEM TARTRATE 5  MG TABS (ZOLPIDEM TARTRATE) take one tablet at bedtime as needed  #30 x 3   Entered by:   Doristine Devoid CMA   Authorized by:   Neena Rhymes MD   Signed by:   Doristine Devoid CMA on 12/19/2009   Method used:   Printed then faxed to ...       [facility] [address] [phone]* (retail)       [address]       [place], [place]  [address]       Ph: [phone]       Fax: [phone]   RxID:   [id number]

## 2010-05-23 NOTE — Progress Notes (Signed)
Summary: information  Phone Note Other Incoming   Caller: Lasasha @ [employer]  [phone] x [phone] Summary of Call: Wanted to make you aware that pt. has a case manager at [facility] and if she can be of any assistance she can be reached. Pt is sch'd for surgery on 01-04-10 and she will be assisting pt. Initial call taken by: Karna Christmas,  January 02, 2010 2:58 PM  Follow-up for Phone Call        noted Follow-up by: Chales Abrahams CMA Duncan Dull),  January 02, 2010 3:14 PM

## 2010-05-23 NOTE — Assessment & Plan Note (Signed)
Summary: cpx/fasting//kn   Vital Signs:  Patient profile:   55 year old female Height:      63 inches Weight:      121 pounds BMI:     21.51 Pulse rate:   80 / minute BP sitting:   120 / 80  (left arm)  Vitals Entered By: Doristine Devoid CMA (November 22, 2009 8:29 AM) CC: CPX AND LABS    History of Present Illness: 55 yo woman here today for CPE.  has mammogram and dexa scheduled for tomorrow.  insomnia- 'i sleep for 2 hrs and then wake up'.  hasn't slept through the night 'in a long time'.  goes to bed at 9-10pm and wakes at 5am but doesn't sleep soundly.  will take OTC PM meds to help w/ sleep but still not sleeping through the night.  Preventive Screening-Counseling & Management  Alcohol-Tobacco     Alcohol drinks/day: <1     Smoking Status: current     Smoking Cessation Counseling: yes     Smoke Cessation Stage: contemplative     Packs/Day: 1.0  Caffeine-Diet-Exercise     Does Patient Exercise: no      Drug Use:  never.    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 One Tablet By Mouth Once Daily 2)  Caltrate 600 1500 Mg Tabs (Calcium Carbonate) .... 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 CAD-father MI 50's HTN-maternal family DM-maternal family STROKE-mother COLON CA-no BREAST CA-mother    Review of Systems       The patient complains of anorexia.  The patient denies fever, weight loss, weight gain, vision loss, decreased hearing, hoarseness, chest pain, syncope, dyspnea on exertion, peripheral edema, prolonged cough, headaches, abdominal pain, melena, hematochezia, severe indigestion/heartburn, hematuria, suspicious skin lesions, depression, abnormal bleeding, enlarged lymph nodes, and breast masses.         anorexia is recent due to stress  Physical Exam  General:  Well-developed,well-nourished,in no acute distress; alert,appropriate and cooperative throughout examination Head:  Normocephalic and atraumatic without obvious abnormalities. No apparent alopecia or balding. Eyes:  No corneal or conjunctival inflammation noted. EOMI. Perrla. Funduscopic exam deferred to Dr Hazle Quant Ears:  External ear exam shows no significant lesions or deformities.  Otoscopic examination reveals clear canals, tympanic membranes are intact bilaterally without bulging, retraction, inflammation or discharge. Hearing is grossly normal bilaterally. Nose:  External nasal examination shows no deformity or inflammation. Nasal mucosa are pink and moist without lesions or exudates. Mouth:  Oral mucosa and oropharynx without lesions or exudates.  Neck:  No deformities, masses, or tenderness noted. Lungs:  Normal respiratory effort, chest  expands symmetrically. Lungs are clear to auscultation, no crackles or wheezes. Heart:  Normal rate and regular rhythm. S1 and S2 normal without gallop, murmur, click, rub or other extra sounds. Abdomen:  soft, NT/ND, +BS, no  hepatosplenomegaly. Genitalia:         External: normal female genitalia without lesions or masses        Vagina: normal without lesions or masses Msk:  No deformity or scoliosis noted of thoracic or lumbar spine.   Pulses:  +2 carotid, radial, DP Extremities:  No clubbing, cyanosis, edema, or deformity noted with normal full range of motion of all joints.   Neurologic:  No cranial nerve deficits noted. Station and gait are normal. Plantar reflexes are down-going bilaterally. DTRs are symmetrical throughout. Sensory, motor and coordinative functions appear intact. Skin:  flesh colored nodules diffusely Cervical Nodes:  No lymphadenopathy noted Axillary Nodes:  No palpable lymphadenopathy Psych:  Cognition and judgment appear intact. Alert and cooperative with normal attention span and concentration. No apparent delusions, illusions, hallucinations   Impression & Recommendations:  Problem # 1:  ROUTINE GYNECOLOGICAL EXAMINATION (ICD-V72.31) Assessment New pt's PE WNL w/ exception of multiple fibromas.  strongly encouraged smoking cessation.  check labs.  anticipatory guidance provided. Orders: Venipuncture (16109) TLB-Lipid Panel (80061-LIPID) TLB-BMP (Basic Metabolic Panel-BMET) (80048-METABOL) TLB-CBC Platelet - w/Differential (85025-CBCD) TLB-TSH (Thyroid Stimulating Hormone) (84443-TSH) T-Vitamin D (25-Hydroxy) (60454-09811) Specimen Handling (91478) EKG w/ Interpretation (93000)  Problem # 2:  INSOMNIA-SLEEP DISORDER-UNSPEC (ICD-780.52) Assessment: New  will start Silenor.  reviewed sleep hygeine.  Her updated medication list for this problem includes:    Silenor 6 Mg Tabs (Doxepin hcl) .Marland Kitchen... 1 tab by mouth daily  Complete Medication List: 1)  Multivitamins Tabs (Multiple vitamin) .... One tablet by mouth once daily 2)  Caltrate 600 1500 Mg Tabs (Calcium carbonate) .... One tablet by mouth once daily 3)  Silenor 6 Mg Tabs (Doxepin hcl) .Marland Kitchen.. 1 tab by mouth daily  Patient  Instructions: 1)  We'll notify you of your lab results 2)  STOP SMOKING! 3)  Try and get regular exercise 4)  Call with any questions or concerns 5)  Your exam looks great! 6)  Enjoy the rest of your summer! Prescriptions: SILENOR 6 MG TABS (DOXEPIN HCL) 1 tab by mouth daily  #30 x 3   Entered and Authorized by:   Neena Rhymes MD   Signed by:   Neena Rhymes MD on 11/22/2009   Method used:   Electronically to        [facility]. [phone]* (retail)       [address]       [place], [place]  [address]       Ph: [phone]       Fax: [phone]   RxID:   [id number]

## 2010-05-23 NOTE — Letter (Signed)
Summary: [facility]  [facility]   Imported By: Sherian Rein 03/03/2010 09:54:51  _____________________________________________________________________  External Attachment:    Type:   Image     Comment:   External Document

## 2010-05-29 ENCOUNTER — Other Ambulatory Visit: Payer: Self-pay | Admitting: General Surgery

## 2010-05-29 DIAGNOSIS — D490 Neoplasm of unspecified behavior of digestive system: Secondary | ICD-10-CM

## 2010-05-31 NOTE — Letter (Signed)
Summary: [facility]   Imported By: Lanelle Bal 05/23/2010 10:27:32  _____________________________________________________________________  External Attachment:    Type:   Image     Comment:   External Document

## 2010-06-01 ENCOUNTER — Ambulatory Visit
Admission: RE | Admit: 2010-06-01 | Discharge: 2010-06-01 | Disposition: A | Discharge status: Home / Self Care | Payer: BC Managed Care – PPO | Source: Ambulatory Visit | Attending: General Surgery | Admitting: General Surgery

## 2010-06-01 DIAGNOSIS — D490 Neoplasm of unspecified behavior of digestive system: Secondary | ICD-10-CM

## 2010-06-01 MED ORDER — IOHEXOL 300 MG/ML  SOLN
100.0000 mL | Freq: Once | INTRAMUSCULAR | Status: AC | PRN
  Administered 2010-06-01: 100 mL via INTRAVENOUS

## 2010-07-06 LAB — COMPREHENSIVE METABOLIC PANEL
ALT: 20 U/L (ref 0–35)
ALT: 24 U/L (ref 0–35)
ALT: 26 U/L (ref 0–35)
ALT: 32 U/L (ref 0–35)
ALT: 50 U/L — ABNORMAL HIGH (ref 0–35)
ALT: 14 U/L (ref 0–35)
AST: 22 U/L (ref 0–37)
AST: 37 U/L (ref 0–37)
AST: 38 U/L — ABNORMAL HIGH (ref 0–37)
AST: 43 U/L — ABNORMAL HIGH (ref 0–37)
AST: 61 U/L — ABNORMAL HIGH (ref 0–37)
AST: 20 U/L (ref 0–37)
Albumin: 2.5 g/dL — ABNORMAL LOW (ref 3.5–5.2)
Albumin: 2.7 g/dL — ABNORMAL LOW (ref 3.5–5.2)
Albumin: 2.8 g/dL — ABNORMAL LOW (ref 3.5–5.2)
Albumin: 3.1 g/dL — ABNORMAL LOW (ref 3.5–5.2)
Albumin: 3.2 g/dL — ABNORMAL LOW (ref 3.5–5.2)
Albumin: 4.1 g/dL (ref 3.5–5.2)
Alkaline Phosphatase: 46 U/L (ref 39–117)
Alkaline Phosphatase: 47 U/L (ref 39–117)
Alkaline Phosphatase: 58 U/L (ref 39–117)
Alkaline Phosphatase: 60 U/L (ref 39–117)
Alkaline Phosphatase: 99 U/L (ref 39–117)
Alkaline Phosphatase: 70 U/L (ref 39–117)
BUN: 3 mg/dL — ABNORMAL LOW (ref 6–23)
BUN: 3 mg/dL — ABNORMAL LOW (ref 6–23)
BUN: 3 mg/dL — ABNORMAL LOW (ref 6–23)
BUN: 5 mg/dL — ABNORMAL LOW (ref 6–23)
BUN: 7 mg/dL (ref 6–23)
BUN: 7 mg/dL (ref 6–23)
CO2: 24 mEq/L (ref 19–32)
CO2: 26 mEq/L (ref 19–32)
CO2: 26 mEq/L (ref 19–32)
CO2: 27 mEq/L (ref 19–32)
CO2: 28 mEq/L (ref 19–32)
CO2: 28 mEq/L (ref 19–32)
Calcium: 7.7 mg/dL — ABNORMAL LOW (ref 8.4–10.5)
Calcium: 7.8 mg/dL — ABNORMAL LOW (ref 8.4–10.5)
Calcium: 8.2 mg/dL — ABNORMAL LOW (ref 8.4–10.5)
Calcium: 8.3 mg/dL — ABNORMAL LOW (ref 8.4–10.5)
Calcium: 8.7 mg/dL (ref 8.4–10.5)
Calcium: 9.6 mg/dL (ref 8.4–10.5)
Chloride: 104 mEq/L (ref 96–112)
Chloride: 105 mEq/L (ref 96–112)
Chloride: 106 mEq/L (ref 96–112)
Chloride: 107 mEq/L (ref 96–112)
Chloride: 109 mEq/L (ref 96–112)
Chloride: 110 mEq/L (ref 96–112)
Creatinine, Ser: 0.5 mg/dL (ref 0.4–1.2)
Creatinine, Ser: 0.57 mg/dL (ref 0.4–1.2)
Creatinine, Ser: 0.6 mg/dL (ref 0.4–1.2)
Creatinine, Ser: 0.61 mg/dL (ref 0.4–1.2)
Creatinine, Ser: 0.63 mg/dL (ref 0.4–1.2)
Creatinine, Ser: 0.69 mg/dL (ref 0.4–1.2)
GFR calc Af Amer: >60 mL/min (ref >60)
GFR calc Af Amer: >60 mL/min (ref >60)
GFR calc Af Amer: >60 mL/min (ref >60)
GFR calc Af Amer: >60 mL/min (ref >60)
GFR calc Af Amer: >60 mL/min (ref >60)
GFR calc Af Amer: >60 mL/min (ref >60)
GFR calc non Af Amer: >60 mL/min (ref >60)
GFR calc non Af Amer: >60 mL/min (ref >60)
GFR calc non Af Amer: >60 mL/min (ref >60)
GFR calc non Af Amer: >60 mL/min (ref >60)
GFR calc non Af Amer: >60 mL/min (ref >60)
GFR calc non Af Amer: >60 mL/min (ref >60)
Glucose, Bld: 103 mg/dL — ABNORMAL HIGH (ref 70–99)
Glucose, Bld: 106 mg/dL — ABNORMAL HIGH (ref 70–99)
Glucose, Bld: 114 mg/dL — ABNORMAL HIGH (ref 70–99)
Glucose, Bld: 117 mg/dL — ABNORMAL HIGH (ref 70–99)
Glucose, Bld: 121 mg/dL — ABNORMAL HIGH (ref 70–99)
Glucose, Bld: 96 mg/dL (ref 70–99)
Potassium: 4 mEq/L (ref 3.5–5.1)
Potassium: 4.1 mEq/L (ref 3.5–5.1)
Potassium: 4.5 mEq/L (ref 3.5–5.1)
Potassium: 4.6 mEq/L (ref 3.5–5.1)
Potassium: 5.3 mEq/L — ABNORMAL HIGH (ref 3.5–5.1)
Potassium: 5 mEq/L (ref 3.5–5.1)
Sodium: 135 mEq/L (ref 135–145)
Sodium: 136 mEq/L (ref 135–145)
Sodium: 137 mEq/L (ref 135–145)
Sodium: 138 mEq/L (ref 135–145)
Sodium: 138 mEq/L (ref 135–145)
Sodium: 144 mEq/L (ref 135–145)
Total Bilirubin: 0.4 mg/dL (ref 0.3–1.2)
Total Bilirubin: 0.5 mg/dL (ref 0.3–1.2)
Total Bilirubin: 0.6 mg/dL (ref 0.3–1.2)
Total Bilirubin: 0.7 mg/dL (ref 0.3–1.2)
Total Bilirubin: 0.8 mg/dL (ref 0.3–1.2)
Total Bilirubin: 0.4 mg/dL (ref 0.3–1.2)
Total Protein: 5.3 g/dL — ABNORMAL LOW (ref 6.0–8.3)
Total Protein: 5.6 g/dL — ABNORMAL LOW (ref 6.0–8.3)
Total Protein: 5.6 g/dL — ABNORMAL LOW (ref 6.0–8.3)
Total Protein: 5.7 g/dL — ABNORMAL LOW (ref 6.0–8.3)
Total Protein: 6 g/dL (ref 6.0–8.3)
Total Protein: 7.7 g/dL (ref 6.0–8.3)

## 2010-07-06 LAB — GLUCOSE, CAPILLARY
Glucose-Capillary: 100 mg/dL — ABNORMAL HIGH (ref 70–99)
Glucose-Capillary: 105 mg/dL — ABNORMAL HIGH (ref 70–99)
Glucose-Capillary: 105 mg/dL — ABNORMAL HIGH (ref 70–99)
Glucose-Capillary: 108 mg/dL — ABNORMAL HIGH (ref 70–99)
Glucose-Capillary: 108 mg/dL — ABNORMAL HIGH (ref 70–99)
Glucose-Capillary: 109 mg/dL — ABNORMAL HIGH (ref 70–99)
Glucose-Capillary: 110 mg/dL — ABNORMAL HIGH (ref 70–99)
Glucose-Capillary: 113 mg/dL — ABNORMAL HIGH (ref 70–99)
Glucose-Capillary: 115 mg/dL — ABNORMAL HIGH (ref 70–99)
Glucose-Capillary: 117 mg/dL — ABNORMAL HIGH (ref 70–99)
Glucose-Capillary: 118 mg/dL — ABNORMAL HIGH (ref 70–99)
Glucose-Capillary: 120 mg/dL — ABNORMAL HIGH (ref 70–99)
Glucose-Capillary: 130 mg/dL — ABNORMAL HIGH (ref 70–99)
Glucose-Capillary: 95 mg/dL (ref 70–99)
Glucose-Capillary: 96 mg/dL (ref 70–99)
Glucose-Capillary: 98 mg/dL (ref 70–99)
Glucose-Capillary: 99 mg/dL (ref 70–99)
Glucose-Capillary: 147 mg/dL — ABNORMAL HIGH (ref 70–99)

## 2010-07-06 LAB — BASIC METABOLIC PANEL
BUN: 6 mg/dL (ref 6–23)
CO2: 26 mEq/L (ref 19–32)
Calcium: 8.5 mg/dL (ref 8.4–10.5)
Chloride: 103 mEq/L (ref 96–112)
Creatinine, Ser: 0.55 mg/dL (ref 0.4–1.2)
GFR calc Af Amer: >60 mL/min (ref >60)
GFR calc non Af Amer: >60 mL/min (ref >60)
Glucose, Bld: 94 mg/dL (ref 70–99)
Potassium: 3.6 mEq/L (ref 3.5–5.1)
Sodium: 138 mEq/L (ref 135–145)

## 2010-07-06 LAB — URINALYSIS, ROUTINE W REFLEX MICROSCOPIC
Bilirubin Urine: NEGATIVE
Glucose, UA: NEGATIVE mg/dL
Ketones, ur: NEGATIVE mg/dL
Leukocytes, UA: NEGATIVE
Nitrite: NEGATIVE
Protein, ur: NEGATIVE mg/dL
Specific Gravity, Urine: 1.003 — ABNORMAL LOW (ref 1.005–1.030)
Urobilinogen, UA: 0.2 mg/dL (ref 0.0–1.0)
pH: 6.5 (ref 5.0–8.0)

## 2010-07-06 LAB — CBC
HCT: 26.6 % — ABNORMAL LOW (ref 36.0–46.0)
HCT: 28 % — ABNORMAL LOW (ref 36.0–46.0)
HCT: 28.6 % — ABNORMAL LOW (ref 36.0–46.0)
HCT: 29.6 % — ABNORMAL LOW (ref 36.0–46.0)
HCT: 29.6 % — ABNORMAL LOW (ref 36.0–46.0)
HCT: 35.1 % — ABNORMAL LOW (ref 36.0–46.0)
HCT: 42.2 % (ref 36.0–46.0)
Hemoglobin: 10 g/dL — ABNORMAL LOW (ref 12.0–15.0)
Hemoglobin: 10 g/dL — ABNORMAL LOW (ref 12.0–15.0)
Hemoglobin: 11.7 g/dL — ABNORMAL LOW (ref 12.0–15.0)
Hemoglobin: 9 g/dL — ABNORMAL LOW (ref 12.0–15.0)
Hemoglobin: 9.4 g/dL — ABNORMAL LOW (ref 12.0–15.0)
Hemoglobin: 9.5 g/dL — ABNORMAL LOW (ref 12.0–15.0)
Hemoglobin: 14.2 g/dL (ref 12.0–15.0)
MCH: 29.3 pg (ref 26.0–34.0)
MCH: 29.3 pg (ref 26.0–34.0)
MCH: 29.3 pg (ref 26.0–34.0)
MCH: 29.4 pg (ref 26.0–34.0)
MCH: 29.5 pg (ref 26.0–34.0)
MCH: 29.5 pg (ref 26.0–34.0)
MCH: 29.6 pg (ref 26.0–34.0)
MCHC: 33.3 g/dL (ref 30.0–36.0)
MCHC: 33.3 g/dL (ref 30.0–36.0)
MCHC: 33.5 g/dL (ref 30.0–36.0)
MCHC: 33.7 g/dL (ref 30.0–36.0)
MCHC: 33.8 g/dL (ref 30.0–36.0)
MCHC: 33.8 g/dL (ref 30.0–36.0)
MCHC: 33.8 g/dL (ref 30.0–36.0)
MCV: 86.7 fL (ref 78.0–100.0)
MCV: 86.9 fL (ref 78.0–100.0)
MCV: 87.4 fL (ref 78.0–100.0)
MCV: 87.8 fL (ref 78.0–100.0)
MCV: 88.1 fL (ref 78.0–100.0)
MCV: 88.7 fL (ref 78.0–100.0)
MCV: 87.7 fL (ref 78.0–100.0)
Platelets: 189 10*3/uL (ref 150–400)
Platelets: 213 10*3/uL (ref 150–400)
Platelets: 251 10*3/uL (ref 150–400)
Platelets: 275 10*3/uL (ref 150–400)
Platelets: 357 10*3/uL (ref 150–400)
Platelets: 440 10*3/uL — ABNORMAL HIGH (ref 150–400)
Platelets: 362 10*3/uL (ref 150–400)
RBC: 3.04 MIL/uL — ABNORMAL LOW (ref 3.87–5.11)
RBC: 3.19 MIL/uL — ABNORMAL LOW (ref 3.87–5.11)
RBC: 3.24 MIL/uL — ABNORMAL LOW (ref 3.87–5.11)
RBC: 3.4 MIL/uL — ABNORMAL LOW (ref 3.87–5.11)
RBC: 3.42 MIL/uL — ABNORMAL LOW (ref 3.87–5.11)
RBC: 3.96 MIL/uL (ref 3.87–5.11)
RBC: 4.81 MIL/uL (ref 3.87–5.11)
RDW: 14.7 % (ref 11.5–15.5)
RDW: 14.9 % (ref 11.5–15.5)
RDW: 14.9 % (ref 11.5–15.5)
RDW: 15.1 % (ref 11.5–15.5)
RDW: 15.1 % (ref 11.5–15.5)
RDW: 15.1 % (ref 11.5–15.5)
RDW: 15 % (ref 11.5–15.5)
WBC: 15.6 10*3/uL — ABNORMAL HIGH (ref 4.0–10.5)
WBC: 18.3 10*3/uL — ABNORMAL HIGH (ref 4.0–10.5)
WBC: 18.8 10*3/uL — ABNORMAL HIGH (ref 4.0–10.5)
WBC: 25.5 10*3/uL — ABNORMAL HIGH (ref 4.0–10.5)
WBC: 25.6 10*3/uL — ABNORMAL HIGH (ref 4.0–10.5)
WBC: 27.2 10*3/uL — ABNORMAL HIGH (ref 4.0–10.5)
WBC: 12.2 10*3/uL — ABNORMAL HIGH (ref 4.0–10.5)

## 2010-07-06 LAB — DIFFERENTIAL
Basophils Absolute: 0 10*3/uL (ref 0.0–0.1)
Basophils Absolute: 0.1 10*3/uL (ref 0.0–0.1)
Basophils Relative: 0 % (ref 0–1)
Basophils Relative: 1 % (ref 0–1)
Eosinophils Absolute: 0 10*3/uL (ref 0.0–0.7)
Eosinophils Absolute: 0.1 10*3/uL (ref 0.0–0.7)
Eosinophils Relative: 0 % (ref 0–5)
Eosinophils Relative: 1 % (ref 0–5)
Lymphocytes Relative: 9 % — ABNORMAL LOW (ref 12–46)
Lymphocytes Relative: 34 % (ref 12–46)
Lymphs Abs: 2.4 10*3/uL (ref 0.7–4.0)
Lymphs Abs: 4.2 10*3/uL — ABNORMAL HIGH (ref 0.7–4.0)
Monocytes Absolute: 2.7 10*3/uL — ABNORMAL HIGH (ref 0.1–1.0)
Monocytes Absolute: 1.2 10*3/uL — ABNORMAL HIGH (ref 0.1–1.0)
Monocytes Relative: 10 % (ref 3–12)
Monocytes Relative: 10 % (ref 3–12)
Neutro Abs: 22.1 10*3/uL — ABNORMAL HIGH (ref 1.7–7.7)
Neutro Abs: 6.6 10*3/uL (ref 1.7–7.7)
Neutrophils Relative %: 81 % — ABNORMAL HIGH (ref 43–77)
Neutrophils Relative %: 55 % (ref 43–77)

## 2010-07-06 LAB — PHOSPHORUS
Phosphorus: 1.8 mg/dL — ABNORMAL LOW (ref 2.3–4.6)
Phosphorus: 3.3 mg/dL (ref 2.3–4.6)
Phosphorus: 3.8 mg/dL (ref 2.3–4.6)

## 2010-07-06 LAB — BLOOD GAS, ARTERIAL
Acid-base deficit: 4 mmol/L — ABNORMAL HIGH (ref 0.0–2.0)
Bicarbonate: 23.2 mEq/L (ref 20.0–24.0)
Drawn by: 129801
FIO2: 0.56 %
O2 Saturation: 98.7 %
Patient temperature: 98.6
TCO2: 21.8 mmol/L (ref 0–100)
pCO2 arterial: 54.6 mmHg — ABNORMAL HIGH (ref 35.0–45.0)
pH, Arterial: 7.252 — ABNORMAL LOW (ref 7.350–7.400)
pO2, Arterial: 211 mmHg — ABNORMAL HIGH (ref 80.0–100.0)

## 2010-07-06 LAB — CROSSMATCH
ABO/RH(D): O POS
Antibody Screen: NEGATIVE

## 2010-07-06 LAB — POTASSIUM: Potassium: 3.8 mEq/L (ref 3.5–5.1)

## 2010-07-06 LAB — PROTIME-INR
INR: 1.37 (ref 0.00–1.49)
INR: 1.02 (ref 0.00–1.49)
Prothrombin Time: 17.1 seconds — ABNORMAL HIGH (ref 11.6–15.2)
Prothrombin Time: 13.6 seconds (ref 11.6–15.2)

## 2010-07-06 LAB — SURGICAL PCR SCREEN
MRSA, PCR: NEGATIVE
Staphylococcus aureus: NEGATIVE

## 2010-07-06 LAB — MAGNESIUM
Magnesium: 1.5 mg/dL (ref 1.5–2.5)
Magnesium: 2.1 mg/dL (ref 1.5–2.5)
Magnesium: 2.3 mg/dL (ref 1.5–2.5)

## 2010-07-06 LAB — ABO/RH: ABO/RH(D): O POS

## 2010-07-06 LAB — URINE MICROSCOPIC-ADD ON

## 2010-07-06 LAB — APTT: aPTT: 35 seconds (ref 24–37)

## 2010-08-30 ENCOUNTER — Encounter (INDEPENDENT_AMBULATORY_CARE_PROVIDER_SITE_OTHER): Payer: Self-pay | Admitting: General Surgery

## 2010-09-08 NOTE — Op Note (Signed)
[place]. [facility]  Patient:    [name] [name], [name] [name] Visit Number: [id number] MRN: [id number]          Service Type: MED Location: [phone] Attending Physician:  Katha Cabal Dictated by:   D. Karle Plumber, M.D. Admit Date:  06/10/2001                             Operative Report  PREOPERATIVE DIAGNOSIS:  Villous emphysema.  POSTOPERATIVE DIAGNOSIS:  Left spontaneous pneumothorax with persistent air leak.  OPERATIONS PERFORMED:  Left video-assisted thoracoscopic surgery, resection of apical bullae left upper lobe, pleurectomy.  Insertion of right chest tube.  SURGEON:  D. Karle Plumber, M.D.  FIRST ASSISTANT:  Lissa Merlin, P.A.-C.  DESCRIPTION OF PROCEDURE:  The patient had insertion of right CVP line by anesthesia and then underwent general anesthesia.  A dual-lumen tube was inserted.  The patient was to be turned to the left lateral thoracotomy position, but as she was doing this, she became markedly desaturated from oxygen saturations was turned back from a left lateral thoracotomy position back to the supine position.  There were increased breath sounds on the left side, a stat chest x-ray was done, which showed a tension right pneumothorax. A right chest tube was inserted and the patients saturations improved rather remarkably.  It was felt that this patient had either under positive pressure anesthesia had popped a right apical bullae or had had the lung injured during insertion of the right CVP line.  She was then turned to the left lateral thoracotomy position, was prepped and draped in the usual sterile manner.  The previous left chest tube was removed.  The left lung was deflated now and she could tolerate one lung anesthesia with the other chest tube in place.  The chest tube site was used as a trocar site and another was placed in the sixth intercostal space at the anterior axillary line.  The 30 degree scope was inserted  and could see a large bullae in the apical segment of the left upper lobe.  A 4-5 cm incision was made over the third intercostal space and dissection was carried down through the subcutaneous tissues through the third intercostal space and this was opened and a small Tuffier was placed in the space.  This allowed direct vision to take down the blebs and it turned out there were just the marked number of blebs much more than we had suspected involving the apical posterior segment.  The two largest were first resected with the TLC-75 stapler using the Peri-Guard reinforcement and also using a couple applications of the STB-45 Ethicon stapler with Peri-Guard reinforcement. Then, the 45 stapler was used to staple some other blebs posteriorly, some small blebs were ______ away with the no knife EZ-45 stapler.  Finally, after all the blebs had been removed and this was multiple applications of the stapler, a pleurectomy was performed by using the [facility] ring forceps to strip the pleura off mediolaterally, superiorly to inferiorly down to about the 7 or 8th intercostal space.  After all the pleura had been removed, the lung was reexpanded and no air leak was seen under water and then ______ was applied to all the staple lines using ______ primary, then the ______ , then the ultraviolet wand for ______ .  Two chest tubes were brought through the trocar sites and tied in place with zero silk.  The chest was  closed with two pericostals, #1 Vicryl in the muscle layer, 2-0 Vicryl in the subcutaneous tissue and 3-0 Vicryl as a subcuticular stitch.  Dermabond was then applied to the skin.  The patient was returned to recovery room in stable condition. Dictated by:   D. Karle Plumber, M.D. Attending Physician:  Katha Cabal DD:  06/16/01 TD:  06/16/01 Job: [id number]

## 2010-09-08 NOTE — Discharge Summary (Signed)
[place]. [facility]  Patient:    [name] [name], [name] [name] Visit Number: [id number] MRN: [id number]          Service Type: MED Location: [phone] Attending Physician:  Cameron Proud Dictated by:   Sherrie George, P.A. Admit Date:  06/10/2001 Discharge Date: 06/27/2001   CC:         Charlcie Cradle. Delford Field, M.D.  Soyla Murphy. Renne Crigler, M.D.   Discharge Summary  DATE OF BIRTH:  2055-11-08  ADMISSION DIAGNOSES: 1. Spontaneous left pneumothorax. 2. History of heavy tobacco use.  DISCHARGE DIAGNOSES: 1. 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 2. 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 3. Prolonged postoperative airway.  PROCEDURE: 1. Left chest tube placement on 06/10/01. 2. CT scan of the chest on 06/13/01. 3. Right chest tube placement, left video assisted thoracoscopy, resection and    stapling of multiple blebs of the left upper lobe and mechanical    pleurodesis on 06/16/01, Dr. Edwyna Shell.  HISTORY OF PRESENT ILLNESS:  The patient is a 55 year old white female, a medical patient of Dr. [name] who was seen by Dr. Molli Hazard B. Martin on 06/10/01, with a spontaneous left pneumothorax.  She initially presented to Urgent Care, was sent to the ER.  Dr. Daphine Deutscher placed a left #28 chest tube. The patient was admitted for further treatment and observation.  PAST MEDICAL HISTORY:  History that was essentially benign except for smoking.  ALLERGIES:  No known drug allergies.  ADMISSION MEDICATIONS:  Premarin.  For further history and physical, please see the dictated note.  HOSPITAL COURSE:  The patient was admitted.  A left chest tube was placed by Dr. Daphine Deutscher.  The following day, the pneumothorax was noted to be worse after the chest tube insertion.  She was ultimately seen in consultation by  Dr. Karle Plumber who obtained a CT scan.  This showed bulous disease involving both upper lobes and a smaller subpleural blebs in the lower lobes bilaterally also.  The patient had a 40% left pneumothorax at that time.  There was an anterior exterior hepatic bile duct dilatation and dilatation of the pancreatic duct also found.  After Dr. Edwyna Shell reviewed the films, it was his opinion she had bullous emphysema of both lungs, and she would need a left VATS for stapling of her blebs.  She was subsequently transferred to [facility] under Dr. Remo Lipps service.  Pulmonary consult was obtained from Dr. Shan Levans.  It was his impression that the patient had a persistent left pneumothorax, air leak with the need for bulectomy.  The patient also with chronic obstructive pulmonary disease and primary emphysema.  It was his opinion she would require surgery for her disease.  During the interim, she was placed on antibiotics and mucolytics, flutter valve, and O2 titration along with incentive spirometry.  Dr. Molli Hazard ultimately transferred the patient to Dr. Edwyna Shell.  She was transferred to [facility], and ultimately underwent surgery on 06/16/01.  The patient underwent the procedures as described above.  The patient tolerated the procedure well, and returned to the recovery room and then 3300 in satisfactory condition.  Postoperatively, she remained hemodynamically stable, but had prolonged air leaks.  Her first postoperative day, air leak was 4/7, and the suction was increased.  The patient made slow steady progress.  She also had a very elevated  white count which has slowly resolved.  She continued to have bilateral air leaks and there was concern that she had a post-expansion type pneumonia, hence she was placed on Tequin.  Her right chest tube was removed on 06/26/01.  She continued to have a +1 air leak on the left.  By the a.m. of 06/26/01, the air leak was down on the left.   She had decreased subcutaneous air, she was mobilizing well, lungs were much improved, and it was Dr. Remo Lipps opinion that she could probably have her left chest tube removed in the a.m. of 06/27/01, and possibly be discharged home later that same day if she continued to do well.  She had a chest x-ray ordered for in the a.m., and ultimate decision will be made at that time.  CT scan which was obtained after admission not only showed bullous disease involving both upper lobes and smaller subpleural blebs in the lower lobes, but it also showed an intra and extrahepatic bile duct dilatation, and dilatation of the pancreatic duct which correlates with evidence of obstruction.  The patient fortunately had no symptoms of obstruction, has remained afebrile, and is overall doing well from this standpoint.  No further interventions were made here.  DISCHARGE MEDICATIONS: 1. She can resume her preadmission hormone replacement therapy. 2. Nicotine patch 14 mg one q.d. 3. Tequin 400 mg one q.d. x7 days. 4. Ultram 50 mg one or two p.o. q.4h. p.r.n. pain. 5. Multivitamin with iron one q.d. 6. Combivent inhalers 2 puffs q.i.d.  ACTIVITY:  Light to moderate.  No lifting over 10 pounds, no driving, no strenuous activity.  She is to walk daily.  WOUND CARE:  Clean her incision with plain soap and water.  FOLLOWUP: 1. She is to call Dr. Renne Crigler for followup in his office. 2. She will return to see Dr. Edwyna Shell on Wednesday, July 02, 2001, at 12:40    p.m.  She will go and have chest x-ray obtained approximately 1 to 1-1/2    hours prior to follow up with Dr. Edwyna Shell.  The patient is to take the chest    x-ray to Dr. Remo Lipps office.  LABORATORY DATA:  White blood cell count is down to 15.6, hemoglobin 11.9, hematocrit 35.1, platelets 518,000.  The last set of labs on 06/18/01, showed a sodium of 131, potassium 4.4, chloride 98, CO2 28, glucose 112, BUN 4,  creatinine 0.5.  We will recheck those labs  prior to discharge.  Pathology shows benign left lung parenchyma with emphysematous bulla associated with fibrosis, pleural surface with reactive mesothelials, hyperplasia, and chronic inflammation.  Pathology from all of the specimens sent to the lab were consistent with chronic obstructive pulmonary disease. No cancer was noted in any of the specimens.  CONDITION ON DISCHARGE:  Improving. Dictated by:   Sherrie George, P.A. Attending Physician:  Cameron Proud DD:  06/26/01 TD:  06/28/01 Job: [id number]

## 2010-09-08 NOTE — Op Note (Signed)
[place]. [facility]  Patient:    LEVIE, OWENSBY Visit Number: [id number] MRN: [id number]          Service Type: EMS Location: [facility] Attending Physician:  Armanda Heritage Dictated by:   Thorn[place] Park Daphine Deutscher, M.D. Proc. Date: 06/10/01 Admit Date:  06/10/2001                             Operative Report  CHIEF COMPLAINT:  Spontaneous left pneumothorax.  PROCEDURE PERFORMED:  Placement of #28 tube thoracostomy.  SURGEON:  Thornton Park. Daphine Deutscher, M.D.  DESCRIPTION OF PROCEDURE:  Ms. Graciela Husbands was in the ER in bed 14 and in the semi-Fowler position, the left chest was prepped with Betadine and draped sterilely.  I infiltrated the anterior axillary line region with 1% lidocaine and made a transverse incision overlying her rib.  I infiltrated the rib and the intercostal muscles with lidocaine.  I put to 0 sutures of silk on either end of this.  I dissected through the subcutaneous fat and then popped through the muscle and was met with a gush of air from a completely collapsed left lung.  I was then able to dilate the tract and then I inserted a #28 chest tube which passed easily and then it was secured with two of the silk sutures and placed to 20 cmH20 suction.  The patient essentially had no pain throughout the procedure and did well.  There was no blood or fluid recovered, only air. Dictated by:   Th[place] Park Daphine Deutscher, M.D. Attending Physician:  Armanda Heritage DD:  06/10/01 TD:  06/10/01 Job: [id number]

## 2010-09-08 NOTE — Discharge Summary (Signed)
[place]. [facility]  Patient:    [name] [name], [name] [name] Visit Number: [id number] MRN: [id number]          Service Type: MED Location: 2000 2001 01 Attending Physician:  Cameron Proud Dictated by:   Loura Pardon, P.A. Admit Date:  09/19/2001 Discharge Date: 09/29/2001   CC:         [name], M.D.   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  SECONDARY DIAGNOSES: 1. History of spontaneous left pneumothorax status post left video-assisted    thoracoscopy with resection/stapling of multiple blebs and mechanical    pleurodesis on June 16, 2001. 2. History of long term tobacco habituation. 3. Chronic obstructive pulmonary disease/emphysema.  PROCEDURES: 1. Sep 19, 2001 - placement of right thoracostomy tube by Dr. Algis Downs. Karle Plumber. 2. September 22, 2001 - right video-assisted thorascopy, right mini thoracotomy,    stapling of multiple apical blebs, mechanical pleurodesis by Dr. Norton Blizzard, surgeon.  The patient tolerated the procedure well and was    transferred in stable, satisfactory condition to the recovery room.  DISCHARGE DISPOSITION: The patient is ready for discharge on postoperative day number seven.  It has taken several days for complete resolution of the pneumothorax secondary to her emphysematous disease.  She has not had a prolonged air leak from her chest tube in the postoperative period. In fact, she had a very small leak noted on postoperative day number one and no leak at all on postoperative day number two.  In addition, there was very little chest tube output.  At that time the anterior chest tube was removed. On postoperative day number three the chest x-ray showed increasing infiltrates and she was treated with Lasix with resolution and clearing of her lung fields on subsequent x-rays.  The chest tube was placed  to water seal on postoperative day number three and a follow-up chest x-ray taken on postoperative day number four showed a right apical pneumothorax and the chest tube was once again placed on suction.  It was left in place for the next 48 hours.  Chest x-rays on postoperative day number five and six showed full expansion of the lung on the right side.  The chest tube was removed on postoperative day number six with a follow-up chest x-ray on postoperative day number seven. If this chest x-ray looks good, she will be discharged home.  In the postoperative period she also had an increased white cell count at 29.5 on postoperative day number two with some increase in temperature and a productive cough.  She was started on Tequin and she will remain on this for an eight day period.  Subsequent complete blood count on postoperative day number four showed the white cell count had gone down from 29.5 to 29.1.  The patient was maintained on a PCA morphine pump in the postoperative period. This was discontinued on postoperative day number two since she was tolerating oral Percocet for her pain. She will go home on this medication as well.  The patients mental status has remained clear.  She has maintained sinus rhythm in the postoperative period.  She has had no respiratory compromise.  She was relieved of all supplemental oxygen by postoperative day number three and after diuresis her oxygen saturations markedly improved. She is ambulating independently.  She is taking all nourishment well. She has full GI  tract function.  DISCHARGE MEDICATIONS: 1. Percocet 5/325 mg one to two tablets p.o. q.4. to 6h p.r.n. pain. 2. Premarin 0.9 mg daily. 3. Multivitamin daily.  DISCHARGE ACTIVITY:   Walk daily to build up her strength.  She is asked not to drive until she sees Dr. Edwyna Shell in the office.  DIET:  She has no restrictions on her diet.  OTHER INSTRUCTIONS:  She may shower daily keeping her  incisions clean and dry.  FOLLOW-UP: 1. She will see Dr. Edwyna Shell on June 16; his office will call with that    appointment. 2. She will visit [facility] for a chest x-ray one hour    before the visit with Dr. Edwyna Shell.  BRIEF HISTORY:  The patient is a 55 year old female well known to [facility] for admission and surgery in February 2003. At that time she was treated for a spontaneous left pneumothorax with extensive bullous emphysematous disease and had a rather prolonged postoperative course secondary to air leak from the chest tube.  On the evening of May 29 at about midnight, the patient noticed increasing shortness of breath which progressed overnight.  She did not sleep very much.  She called the office, [facility] [place] on the morning of May 30, and was directed to the [facility] Emergency Room. A chest x-ray there showed a large right pneumothorax.  At that time, a 24 French chest tube was inserted by Dr. Edwyna Shell.  The hospital course is as described in the discharge disposition.  The patient had surgery on the right lung on September 22, 2001 to correct a right spontaneous pneumothorax secondary to emphysematous disease.  Her postoperative course has lasted seven days.  She goes home in improved, satisfactory condition.  Dictated by:   Loura Pardon, P.A.  Attending Physician:  Cameron Proud DD:  09/28/01 TD:  09/30/01 Job: [id number]

## 2010-09-08 NOTE — Op Note (Signed)
[place]. [facility]  Patient:    [name] [name], [name] [name] Visit Number: [id number] MRN: [id number]          Service Type: MED Location: 2000 2001 01 Attending Physician:  Cameron Proud Dictated by:   D. Karle Plumber, M.D. Admit Date:  09/19/2001                             Operative Report  PREOPERATIVE DIAGNOSIS:  Persistent left pneumothorax secondary to multiple bullae.  POSTOPERATIVE DIAGNOSIS:  Persistent left pneumothorax secondary to multiple bullae.  OPERATION PERFORMED:  Left video-assisted thoracic surgery, wedge resection of multiple bullae and pleurectomy.  SURGEON:  D. Karle Plumber, M.D.  FIRST ASSISTANT:  Loura Pardon, P.A.  ANESTHESIA:  General.  DESCRIPTION OF PROCEDURE:  After adequate general anesthesia, the patient was turned to left lateral thoracotomy position.  A dual-lumen tube was inserted. She had had a previous left VATS for persistent pneumothorax and air leaks and now comes in with a ______  VATS with the same problem.  Two trocars sites were made, one in the 5th intercostal space at the midaxillary line and one at the 6th intercostal space at the anterior axillary line.  Two trocars were inserted.  A chest tube had been inserted previously and this was removed and that made the 5th intercostal space midaxillary line trocar site.  After the 30 degree scope was inserted, there were some adhesions to the chest wall and these were taken down with electrocautery.  Multiple bullae were seen in the apex.  A 3-cm incision was made at the 3rd intercostal space and a small laminar spreader was inserted.  The multiple bullae were then grasped with Adan Sis ring forceps and stapled with the E-Z 45 stapler, stapling apically and apicoposteriorly and then anteriorly; there were bullae on all sides.  One area was oversewn with 3-0 Vicryl.  Peri-Guard was used for the anterior bullae and the posterior bulla was just stapled and then  also there were several that were stapled with a no-knife stapler.  This was checked for air leak under water and did not appear to have any more air leak, and then a pleurectomy was done, stripping the pleura off the apex using Keizer ring forceps and continuing to strip it medially and laterally using the scope to guide the stripping down to the 7th and 8th intercostal spaces circumferentially.  After this had been done, the area was irrigated copiously.  Two chest tubes were brought through the trocar sites and tied in place with 0 silk.  The chest was closed with 2-0 chromic and then interrupted #1 Vicryl and 3-0 Vicryl as a subcuticular stitch and Dermabond on the skin. The patient was returned to the recovery room in stable condition. Dictated by:   D. Karle Plumber, M.D. Attending Physician:  Cameron Proud DD:  09/22/01 TD:  09/23/01 Job: [id number]

## 2010-09-08 NOTE — Consult Note (Signed)
[place]. [facility]  Patient:    [name] [name], [name] [name] Visit Number: [id number] MRN: [id number]          Service Type: MED Location: [phone] Attending Physician:  Katha Cabal Dictated by:   Charlcie Cradle Delford Field, M.D. [facility] Proc. Date: 06/13/01 Admit Date:  06/10/2001   CC:         [place]. Daphine Deutscher, M.D.  D. Karle Plumber, M.D.   Consultation Report  CHIEF COMPLAINT:  Spontaneous left pneumothorax.  HISTORY OF PRESENT ILLNESS:  This is a 56 year old white female who was seen in Urgent Care on June 10, 2001, with acute onset chest pain and shortness of breath and cough.  She had been sick for several days prior to this.  She was found to have on chest radiograph a large left pneumothorax.  The patient was admitted to [facility] for further inpatient care.  Chest tube was placed, and the patient still has persistent leak and left pneumothorax, now referred by Dr. Edwyna Shell to evaluate preoperatively for planned left bullectomy and pneumothorax repair on June 16, 2001.  The patient denies any family history of emphysema, has never had bronchitis or pneumonia in the past.  No previously known diagnosis of COPD.  The patient smokes two packs per day for 30 years.  She does note some slight wheezing.  No mucus production at this time.  Some dry cough.  Dyspnea is actually improved since admission, but a chest x-ray still shows persistent pneumothorax.  PAST MEDICAL HISTORY:  Medical: Essentially negative medical history.  MEDICATIONS PRIOR TO ADMISSION:  Premarin.  PAST SURGICAL HISTORY:  Hysterectomy, tonsillectomy, and adenoidectomy.  FAMILY HISTORY:  Positive for asthma in a sister and father.  MI, strokes in mother.  History of hypertension as well.  SOCIAL HISTORY:  The patient works in an office environment, smokes two packs a day and has done so for 30 years.  PHYSICAL EXAMINATION:  VITAL SIGNS:  Temperature 98, blood  pressure 115/41, pulse 65, respirations 18, saturation 96% on 3 liters.  CHEST:  Diminished breath sounds bilaterally without evidence of wheeze or rhonchi.  Left chest tube is in place.  Air leak is noted.  CARDIAC:  Regular rate and rhythm with no S3.  Normal S1 and S2.  ABDOMEN:  Soft, nontender.  Bowel sounds active.  EXTREMITIES:  No edema or clubbing.  NEUROLOGIC:  Intact.  DIAGNOSTIC DATA:  No lab data in the system at all.  Chest x-ray shows persistent left pneumothorax, chest tube in adequate position.  IMPRESSION AND PLAN:  Persistent left pneumothorax with left chest tube in place and air leak persisting.  Need for left bullectomy is noted.  The patient has underlying chronic obstructive pulmonary disease with primary emphysema.  Okay pulmonary wise for patient to proceed with surgery.  Will add nebulizer therapy, mucolytics, flutter valve, and oxygen titration as well as incentive spirometry.  Will also get baseline lab studies for this patient. Dictated by:   Charlcie Cradle Delford Field, M.D. [facility] Attending Physician:  Katha Cabal DD:  06/13/01 TD:  06/14/01 Job: [id number]

## 2010-11-23 ENCOUNTER — Other Ambulatory Visit: Payer: Self-pay | Admitting: Family Medicine

## 2010-11-23 DIAGNOSIS — Z1231 Encounter for screening mammogram for malignant neoplasm of breast: Secondary | ICD-10-CM

## 2010-11-29 ENCOUNTER — Ambulatory Visit
Admission: RE | Admit: 2010-11-29 | Discharge: 2010-11-29 | Disposition: A | Discharge status: Home / Self Care | Payer: BC Managed Care – PPO | Source: Ambulatory Visit | Attending: Family Medicine | Admitting: Family Medicine

## 2010-11-29 DIAGNOSIS — Z1231 Encounter for screening mammogram for malignant neoplasm of breast: Secondary | ICD-10-CM

## 2010-12-05 ENCOUNTER — Other Ambulatory Visit: Payer: Self-pay | Admitting: Family Medicine

## 2010-12-05 DIAGNOSIS — R928 Other abnormal and inconclusive findings on diagnostic imaging of breast: Secondary | ICD-10-CM

## 2010-12-06 ENCOUNTER — Other Ambulatory Visit (INDEPENDENT_AMBULATORY_CARE_PROVIDER_SITE_OTHER): Payer: Self-pay | Admitting: General Surgery

## 2010-12-06 ENCOUNTER — Other Ambulatory Visit (INDEPENDENT_AMBULATORY_CARE_PROVIDER_SITE_OTHER): Payer: Self-pay

## 2010-12-06 DIAGNOSIS — C259 Malignant neoplasm of pancreas, unspecified: Secondary | ICD-10-CM

## 2010-12-08 ENCOUNTER — Other Ambulatory Visit: Payer: Self-pay | Admitting: Family Medicine

## 2010-12-08 ENCOUNTER — Ambulatory Visit
Admission: RE | Admit: 2010-12-08 | Discharge: 2010-12-08 | Disposition: A | Discharge status: Home / Self Care | Payer: BC Managed Care – PPO | Source: Ambulatory Visit | Attending: Family Medicine | Admitting: Family Medicine

## 2010-12-08 ENCOUNTER — Other Ambulatory Visit (INDEPENDENT_AMBULATORY_CARE_PROVIDER_SITE_OTHER): Payer: Self-pay | Admitting: General Surgery

## 2010-12-08 DIAGNOSIS — R928 Other abnormal and inconclusive findings on diagnostic imaging of breast: Secondary | ICD-10-CM

## 2010-12-09 LAB — COMPREHENSIVE METABOLIC PANEL
ALT: 15 U/L (ref 0–35)
AST: 18 U/L (ref 0–37)
Albumin: 4 g/dL (ref 3.5–5.2)
Alkaline Phosphatase: 74 U/L (ref 39–117)
BUN: 11 mg/dL (ref 6–23)
CO2: 23 mEq/L (ref 19–32)
Calcium: 9 mg/dL (ref 8.4–10.5)
Chloride: 101 mEq/L (ref 96–112)
Creat: 0.51 mg/dL (ref 0.50–1.10)
Glucose, Bld: 86 mg/dL (ref 70–99)
Potassium: 4.8 mEq/L (ref 3.5–5.3)
Sodium: 139 mEq/L (ref 135–145)
Total Bilirubin: 0.3 mg/dL (ref 0.3–1.2)
Total Protein: 7 g/dL (ref 6.0–8.3)

## 2010-12-09 LAB — CANCER ANTIGEN 19-9: CA 19-9: 3.8 U/mL (ref <35.0)

## 2010-12-12 ENCOUNTER — Other Ambulatory Visit (INDEPENDENT_AMBULATORY_CARE_PROVIDER_SITE_OTHER): Payer: Self-pay | Admitting: General Surgery

## 2010-12-12 ENCOUNTER — Ambulatory Visit
Admission: RE | Admit: 2010-12-12 | Discharge: 2010-12-12 | Disposition: A | Discharge status: Home / Self Care | Payer: BC Managed Care – PPO | Source: Ambulatory Visit | Attending: General Surgery | Admitting: General Surgery

## 2010-12-12 DIAGNOSIS — C259 Malignant neoplasm of pancreas, unspecified: Secondary | ICD-10-CM

## 2010-12-12 MED ORDER — IOHEXOL 300 MG/ML  SOLN
100.0000 mL | Freq: Once | INTRAMUSCULAR | Status: AC | PRN
  Administered 2010-12-12: 100 mL via INTRAVENOUS

## 2010-12-13 NOTE — Progress Notes (Signed)
Quick Note:  Please let ms Krise know ct looks good. No evidence of disease. ______

## 2010-12-14 LAB — CHROMOGRANIN A: Chromogranin A: 8 ng/mL (ref 1.9–15.0)

## 2010-12-16 LAB — 5 HIAA, QUANTITATIVE, URINE, 24 HOUR: 5-HIAA, 24 Hr Urine: 3.8 mg/24 h (ref <=6.0)

## 2010-12-17 NOTE — Progress Notes (Signed)
Quick Note:  Please call pt and tell labs ok. ______ 

## 2010-12-18 ENCOUNTER — Ambulatory Visit (INDEPENDENT_AMBULATORY_CARE_PROVIDER_SITE_OTHER): Payer: BC Managed Care – PPO | Admitting: General Surgery

## 2010-12-18 DIAGNOSIS — C170 Malignant neoplasm of duodenum: Secondary | ICD-10-CM

## 2010-12-18 NOTE — Assessment & Plan Note (Addendum)
Pt having some issues with nausea and decreased appetite.  Weight down 17 pounds since pre op, but pt says mostly stable in last 6 months.   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   CT and labs all good without evidence of recurrent tumor. Follow up in 6 months. 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  None of these have become large enough to see on scan.

## 2010-12-18 NOTE — Progress Notes (Signed)
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 She did not require any adjuvant treatment. She also had numerous GI stromal tumors on her small bowel. Several were resected with the Whipple specimen. Others were too small and scattered throughout her entire small bowel and could not be resected. This is felt to be due to her neurofibromatosis. She has overall been doing well, but over the last month has had some issues with nausea. She has tried taking some over-the-counter Tagamet that has not altered the symptoms. She denies any significant reflux. She denies vomiting. She has been eating multiple small meals per day. She thinks are weight has mostly been stable over the past 6 months, but she may have lost a couple pounds in the last month. She had some diarrhea associated with some antibiotic treatment. This has resolved.     PERTINENT REVIEW OF SYSTEMS: Otherwise negative times 11 systems.     EXAM: Head: Normocephalic and atraumatic.  Eyes:  Conjunctivae are normal. Pupils are equal, round, and reactive to light. No scleral icterus.  Neck:  Normal range of motion. Neck supple. No tracheal deviation present. No thyromegaly present.  Resp: No respiratory distress, normal effort. Abd: Soft. Bowel sounds are normal. Abdomen is soft, non distended and non tender No masses are palpable.  There is no rebound and no guarding.  Neurological: Alert and oriented to person, place, and time. Coordination normal.  Skin: Skin is warm and dry. No rash noted. No diaphoretic. No erythema. No pallor.  Psychiatric: Normal mood and affect. Normal behavior. Judgment and thought content normal.   Reviewed images and report of abd/pelvis CT.  Labs reviewed including chromogranin and 5-HIAA.     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  Weight down 17 pounds since pre  op, but pt says mostly stable in last 6 months.   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   CT and labs all good without evidence of recurrent tumor. Follow up in 6 months. 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  None of these have become large enough to see on scan.          Maudry Diego, MD Surgical Oncology, General & Endocrine Surgery [facility]  Neena Rhymes, MD, MD Neena Rhymes, MD

## 2010-12-22 ENCOUNTER — Telehealth (INDEPENDENT_AMBULATORY_CARE_PROVIDER_SITE_OTHER): Payer: Self-pay

## 2010-12-22 ENCOUNTER — Ambulatory Visit
Admission: RE | Admit: 2010-12-22 | Discharge: 2010-12-22 | Disposition: A | Discharge status: Home / Self Care | Payer: BC Managed Care – PPO | Source: Ambulatory Visit | Attending: General Surgery | Admitting: General Surgery

## 2010-12-22 NOTE — Telephone Encounter (Signed)
Spoke with pt today and gave her normal CT scan results.

## 2010-12-27 NOTE — Progress Notes (Signed)
Quick Note:  Please let pt know study looks good. No areas of concern. ______ 

## 2011-01-03 ENCOUNTER — Encounter: Payer: BC Managed Care – PPO | Admitting: Family Medicine

## 2011-01-29 ENCOUNTER — Encounter: Payer: Self-pay | Admitting: Family Medicine

## 2011-01-29 ENCOUNTER — Ambulatory Visit (INDEPENDENT_AMBULATORY_CARE_PROVIDER_SITE_OTHER): Payer: BC Managed Care – PPO | Admitting: Family Medicine

## 2011-01-29 DIAGNOSIS — M899 Disorder of bone, unspecified: Secondary | ICD-10-CM

## 2011-01-29 DIAGNOSIS — F17200 Nicotine dependence, unspecified, uncomplicated: Secondary | ICD-10-CM

## 2011-01-29 DIAGNOSIS — Z0000 Encounter for general adult medical examination without abnormal findings: Secondary | ICD-10-CM

## 2011-01-29 DIAGNOSIS — M949 Disorder of cartilage, unspecified: Secondary | ICD-10-CM

## 2011-01-29 LAB — CBC WITH DIFFERENTIAL/PLATELET
Basophils Absolute: 0 10*3/uL (ref 0.0–0.1)
Basophils Relative: 0.4 % (ref 0.0–3.0)
Eosinophils Absolute: 0.1 10*3/uL (ref 0.0–0.7)
Eosinophils Relative: 1 % (ref 0.0–5.0)
HCT: 42.6 % (ref 36.0–46.0)
Hemoglobin: 14 g/dL (ref 12.0–15.0)
Lymphocytes Relative: 26.3 % (ref 12.0–46.0)
Lymphs Abs: 3.3 10*3/uL (ref 0.7–4.0)
MCHC: 32.8 g/dL (ref 30.0–36.0)
MCV: 90.6 fl (ref 78.0–100.0)
Monocytes Absolute: 1 10*3/uL (ref 0.1–1.0)
Monocytes Relative: 8.4 % (ref 3.0–12.0)
Neutro Abs: 7.9 10*3/uL — ABNORMAL HIGH (ref 1.4–7.7)
Neutrophils Relative %: 63.9 % (ref 43.0–77.0)
Platelets: 334 10*3/uL (ref 150.0–400.0)
RBC: 4.7 Mil/uL (ref 3.87–5.11)
RDW: 15.2 % — ABNORMAL HIGH (ref 11.5–14.6)
WBC: 12.4 10*3/uL — ABNORMAL HIGH (ref 4.5–10.5)

## 2011-01-29 LAB — TSH: TSH: 1.71 u[IU]/mL (ref 0.35–5.50)

## 2011-01-29 LAB — LIPID PANEL
Cholesterol: 188 mg/dL (ref 0–200)
HDL: 53.7 mg/dL (ref >39.00)
LDL Cholesterol: 117 mg/dL — ABNORMAL HIGH (ref 0–99)
Total CHOL/HDL Ratio: 4
Triglycerides: 88 mg/dL (ref 0.0–149.0)
VLDL: 17.6 mg/dL (ref 0.0–40.0)

## 2011-01-29 NOTE — Patient Instructions (Signed)
Follow up in 6 months to recheck weight and appetite We'll notify you of your lab results Try Boost for extra calories Think about quitting smoking- when the time is right Call with any questions or concerns You are amazing!  Hang in there!

## 2011-01-29 NOTE — Progress Notes (Signed)
  Subjective:    Patient ID: [name] [name], female    DOB: 10/09/1955, 55 y.o.   MRN: [id number]  HPI CPE- s/p whipple procedure for Carcinoid.  Still having fatigue and anorexia.  Is trying to eat small amounts but frequently.  'i'm never hungry'.  Reports she cannot tolerate Ensure but has not tried Boost.  GYN- [facility].   Review of Systems Patient reports no vision/ hearing changes, adenopathy, fever,  persistant/recurrent hoarseness , swallowing issues, chest pain, palpitations, edema, persistant/recurrent cough, hemoptysis, dyspnea (rest/exertional/paroxysmal nocturnal), gastrointestinal bleeding (melena, rectal bleeding), significant heartburn, GU symptoms (dysuria, hematuria, incontinence), Gyn symptoms (abnormal  bleeding, pain),  syncope, focal weakness, memory loss, numbness & tingling, skin/hair/nail changes, abnormal bruising or bleeding, anxiety, or depression.   + anorexia, weight loss, abd pain, intermittent diarrhea    Objective:   Physical Exam  General Appearance:    Alert, cooperative, no distress, appears stated age, very thin  Head:    Normocephalic, without obvious abnormality, atraumatic  Eyes:    PERRL, conjunctiva/corneas clear, EOM's intact, fundi    benign, both eyes  Ears:    Normal TM's and external ear canals, both ears  Nose:   Nares normal, septum midline, mucosa normal, no drainage    or sinus tenderness  Throat:   Lips, mucosa, and tongue normal; teeth and gums normal  Neck:   Supple, symmetrical, trachea midline, no adenopathy;    Thyroid: no enlargement/tenderness/nodules  Back:     Symmetric, no curvature, ROM normal, no CVA tenderness  Lungs:     Clear to auscultation bilaterally, respirations unlabored  Chest Wall:    No tenderness or deformity   Heart:    Regular rate and rhythm, S1 and S2 normal, no murmur, rub   or gallop  Breast Exam:    Deferred to GYN  Abdomen:     Soft, non-tender, bowel sounds active all four quadrants,    no  masses, no organomegaly.  Well healed surgical scar  Genitalia:    Deferred to GYN  Rectal:    Extremities:   Extremities normal, atraumatic, no cyanosis or edema  Pulses:   2+ and symmetric all extremities  Skin:   Skin color, texture, turgor normal, no rashes or lesions  Lymph nodes:   Cervical, supraclavicular, and axillary nodes normal  Neurologic:   CNII-XII intact, normal strength, sensation and reflexes    throughout          Assessment & Plan:

## 2011-02-01 LAB — VITAMIN D 1,25 DIHYDROXY
Vitamin D 1, 25 (OH)2 Total: 63 pg/mL (ref 18–72)
Vitamin D2 1, 25 (OH)2: <8 pg/mL
Vitamin D3 1, 25 (OH)2: 63 pg/mL

## 2011-02-01 NOTE — Progress Notes (Signed)
Quick Note:    Labs mailed.  ______

## 2011-02-01 NOTE — Progress Notes (Signed)
Quick Note:    Results mailed.  ______

## 2011-02-06 NOTE — Assessment & Plan Note (Signed)
Check Vit D 

## 2011-02-06 NOTE — Assessment & Plan Note (Signed)
Strongly encouraged her to quit smoking.  Pt not ready.  Will continue to revisit this at future visits.

## 2011-02-06 NOTE — Assessment & Plan Note (Signed)
Pt's PE WNL.  Very thin.  Encouraged Boost and eating regularly despite her lack of appetite since her Whipple.  UTD on GYN screening and colonoscopy.  Check labs.  Anticipatory guidance provided.

## 2011-05-07 ENCOUNTER — Telehealth: Payer: Self-pay | Admitting: Oncology

## 2011-05-07 NOTE — Telephone Encounter (Signed)
lmonvm adviisng the pt of her feb 2013 appt

## 2011-05-23 ENCOUNTER — Telehealth: Payer: Self-pay | Admitting: Oncology

## 2011-05-23 NOTE — Telephone Encounter (Signed)
called pt and r/ s appt 03/01 to 03/08

## 2011-06-20 ENCOUNTER — Ambulatory Visit
Admission: RE | Admit: 2011-06-20 | Discharge: 2011-06-20 | Disposition: A | Discharge status: Home / Self Care | Payer: BC Managed Care – PPO | Source: Ambulatory Visit | Attending: General Surgery | Admitting: General Surgery

## 2011-06-20 DIAGNOSIS — C170 Malignant neoplasm of duodenum: Secondary | ICD-10-CM

## 2011-06-20 MED ORDER — IOHEXOL 300 MG/ML  SOLN
100.0000 mL | Freq: Once | INTRAMUSCULAR | Status: AC | PRN
  Administered 2011-06-20: 100 mL via INTRAVENOUS

## 2011-06-22 ENCOUNTER — Ambulatory Visit: Payer: BC Managed Care – PPO | Admitting: Oncology

## 2011-06-29 ENCOUNTER — Ambulatory Visit (HOSPITAL_BASED_OUTPATIENT_CLINIC_OR_DEPARTMENT_OTHER): Payer: BC Managed Care – PPO | Admitting: Oncology

## 2011-06-29 VITALS — BP 134/83 | HR 91 | Temp 97.1°F | Ht 63.0 in | Wt 101.9 lb

## 2011-06-29 DIAGNOSIS — Z85038 Personal history of other malignant neoplasm of large intestine: Secondary | ICD-10-CM

## 2011-06-29 DIAGNOSIS — D481 Neoplasm of uncertain behavior of connective and other soft tissue: Secondary | ICD-10-CM

## 2011-06-29 DIAGNOSIS — C170 Malignant neoplasm of duodenum: Secondary | ICD-10-CM

## 2011-06-29 DIAGNOSIS — R1110 Vomiting, unspecified: Secondary | ICD-10-CM

## 2011-06-29 NOTE — Progress Notes (Signed)
OFFICE PROGRESS NOTE   INTERVAL HISTORY:   She reports intermittent emesis over the past few months. She vomits various foods. This occurs approximately 3 times per month. She denies nausea. She reports 3 bowel movements per day for the past month. She denies fever. Dr. Donell Beers obtained a restaging CT evaluation on 06/20/2011. There was no evidence of recurrent carcinoid tumor. Dilatation was noted at the common hepatic duct with high attenuation material layering dependently. This was felt to represent ductal calculi versus an anastomotic stricture.     Objective:  Vital signs in last 24 hours:  Blood pressure 134/83, pulse 91, temperature 97.1 F (36.2 C), temperature source Oral, height 5\' 3"  (1.6 m), weight 101 lb 14.4 oz (46.222 kg).    HEENT: Neck without mass Lymphatics: No cervical, supraclavicular, or inguinal nodes, "shotty "right greater than left axillary node Resp: Lungs clear, no respiratory distress Cardio: Regular rate and rhythm GI: Nontender, no hepatosplenic leg, no mass Vascular: No leg edema  Skin: Diffuse cutaneous nodule    Medications: I have reviewed the patient's current medications.  Assessment/Plan: 1. 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           -restaging CT evaluation on 06/20/2011 showed no evidence of recurrent tumor 2. 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 3. 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 4. Ongoing tobacco use. 5. ? Common hepatic duct calculi versus anastomotic stricture on the CT 06/20/2011 6. Intermittent emesis-? Etiology.   Disposition:  She appears to be in clinical remission from the carcinoid tumor. No clinical evidence for progression of the gastrointestinal stromal tumors.  She plans  to followup with Dr. Donell Beers or Dr. Christella Hartigan if the emesis and frequent bowel movements do not improve. She will return for an office visit here in one year. Lucile Shutters, MD  06/29/2011  12:34 PM

## 2011-07-04 ENCOUNTER — Telehealth: Payer: Self-pay | Admitting: Oncology

## 2011-07-04 NOTE — Telephone Encounter (Signed)
called pt and schduled appt for march 2014

## 2011-07-10 ENCOUNTER — Encounter (INDEPENDENT_AMBULATORY_CARE_PROVIDER_SITE_OTHER): Payer: Self-pay | Admitting: General Surgery

## 2011-07-10 ENCOUNTER — Ambulatory Visit (INDEPENDENT_AMBULATORY_CARE_PROVIDER_SITE_OTHER): Payer: BC Managed Care – PPO | Admitting: General Surgery

## 2011-07-10 VITALS — BP 116/74 | HR 66 | Temp 97.2°F | Resp 18 | Ht 63.0 in | Wt 101.2 lb

## 2011-07-10 DIAGNOSIS — K8681 Exocrine pancreatic insufficiency: Secondary | ICD-10-CM | POA: Insufficient documentation

## 2011-07-10 DIAGNOSIS — K8689 Other specified diseases of pancreas: Secondary | ICD-10-CM

## 2011-07-10 DIAGNOSIS — C170 Malignant neoplasm of duodenum: Secondary | ICD-10-CM

## 2011-07-10 DIAGNOSIS — Z8509 Personal history of malignant neoplasm of other digestive organs: Secondary | ICD-10-CM

## 2011-07-10 MED ORDER — PANCRELIPASE (LIP-PROT-AMYL) 24000-76000 UNITS PO CPEP: ORAL_CAPSULE | ORAL | Status: DC

## 2011-07-10 NOTE — Assessment & Plan Note (Signed)
Patient had multiple GI stromal tumors on her intestines that were not all resectable. They involve almost the entire small bowel. I do not think her occasional nausea is obstructive in nature. She had a recent scan with no evidence of any large lesions. At operative exploration 18 months ago, these were all subcentimeter other than the ones that I resected. She is going to try to keep a diary of what causing her symptoms. She thinks that it is only specific foods.

## 2011-07-10 NOTE — Assessment & Plan Note (Signed)
Start Creon for pancreatic insufficiency. She has lost weight and I think that this would help her loose stools. I gave her 48 tablets of samples and the book regarding pancreatic insufficiency. She was also given a Copay coupon.

## 2011-07-10 NOTE — Progress Notes (Signed)
HISTORY: Patient is a 55 year old female who is 18 months status post Whipple for neuroendocrine tumor of the duodenum. She has overall done very well after surgery. She also at the time of operative exploration was found to have many GI stromal tumors on the surface of her small bowel. The largest ones were resected, however to resect all would have given her short gut syndrome. She has continued to have some weight loss after her surgery. She is now having some loose stools. She did not have these in the past. She has recently had some nausea and vomiting. She associates this with eating a specific type of salad. It does not always occur at a specific time of day. She has not having fevers and chills. She denies bloating, constipation, or bloody stools.   PERTINENT REVIEW OF SYSTEMS: Otherwise negative.   EXAM: Head: Normocephalic and atraumatic, very thin. Eyes:  Conjunctivae are normal. Pupils are equal, round, and reactive to light. No scleral icterus.  Neck:  Normal range of motion. Neck supple. No tracheal deviation present. No thyromegaly present.  Resp: No respiratory distress, normal effort. Abd:  Abdomen is soft, non distended and non tender. No masses are palpable.  There is no rebound and no guarding. Incision is well healed without sign of hernia. Neurological: Alert and oriented to person, place, and time. Coordination normal.  Skin: Skin is warm and dry. No rash noted. No diaphoretic. No erythema. No pallor.  Psychiatric: Normal mood and affect. Normal behavior. Judgment and thought content normal.      ASSESSMENT AND PLAN:   Exocrine pancreatic insufficiency Start Creon for pancreatic insufficiency. She has lost weight and I think that this would help her loose stools. I gave her 48 tablets of samples and the book regarding pancreatic insufficiency. She was also given a Copay coupon.  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 Will check tumor markers and CBC/CMET. Otherwise followup in one year.  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 They involve almost the entire small bowel. I do not think her occasional nausea is obstructive in nature. She had a recent scan with no evidence of any large lesions. At operative exploration 18 months ago, these were all subcentimeter other than the ones that I resected. She is going to try to keep a diary of what causing her symptoms. She thinks that it is only specific foods.       Maudry Diego, MD Surgical Oncology, General & Endocrine Surgery [facility]  Neena Rhymes, MD, MD Sheliah Hatch, MD

## 2011-07-10 NOTE — Patient Instructions (Signed)
Start Creon pancreatic enzyme replacements.  Get labs drawn.  Follow up in 6 months.

## 2011-07-10 NOTE — Assessment & Plan Note (Signed)
No evidence of recurrent tumor on recent scan. Will check tumor markers and CBC/CMET. Otherwise followup in one year.

## 2011-07-11 ENCOUNTER — Other Ambulatory Visit (INDEPENDENT_AMBULATORY_CARE_PROVIDER_SITE_OTHER): Payer: Self-pay | Admitting: General Surgery

## 2011-07-11 LAB — COMPREHENSIVE METABOLIC PANEL
ALT: 10 U/L (ref 0–35)
AST: 14 U/L (ref 0–37)
Albumin: 4.1 g/dL (ref 3.5–5.2)
Alkaline Phosphatase: 92 U/L (ref 39–117)
BUN: 8 mg/dL (ref 6–23)
CO2: 22 mEq/L (ref 19–32)
Calcium: 9.5 mg/dL (ref 8.4–10.5)
Chloride: 105 mEq/L (ref 96–112)
Creat: 0.63 mg/dL (ref 0.50–1.10)
Glucose, Bld: 99 mg/dL (ref 70–99)
Potassium: 5 mEq/L (ref 3.5–5.3)
Sodium: 139 mEq/L (ref 135–145)
Total Bilirubin: 0.3 mg/dL (ref 0.3–1.2)
Total Protein: 7.4 g/dL (ref 6.0–8.3)

## 2011-07-11 LAB — CBC
HCT: 42.6 % (ref 36.0–46.0)
Hemoglobin: 13.8 g/dL (ref 12.0–15.0)
MCH: 28.8 pg (ref 26.0–34.0)
MCHC: 32.4 g/dL (ref 30.0–36.0)
MCV: 88.9 fL (ref 78.0–100.0)
Platelets: 433 10*3/uL — ABNORMAL HIGH (ref 150–400)
RBC: 4.79 MIL/uL (ref 3.87–5.11)
RDW: 14.3 % (ref 11.5–15.5)
WBC: 12 10*3/uL — ABNORMAL HIGH (ref 4.0–10.5)

## 2011-07-11 NOTE — Progress Notes (Signed)
Quick Note:  Can you let patient know that labs OK so far? Still waiting for chromogranin.  The white blood cell count is slightly elevated, but it is about the same as it was in October. Tx FB  ______

## 2011-07-12 LAB — CANCER ANTIGEN 19-9: CA 19-9: 4.6 U/mL (ref <35.0)

## 2011-07-16 ENCOUNTER — Telehealth (INDEPENDENT_AMBULATORY_CARE_PROVIDER_SITE_OTHER): Payer: Self-pay

## 2011-07-16 NOTE — Telephone Encounter (Signed)
Pt notified of lab results.  We are still waiting for the chromogranin, and will call her when available.

## 2011-07-17 LAB — CHROMOGRANIN A: Chromogranin A: 8 ng/mL (ref 1.9–15.0)

## 2011-07-19 ENCOUNTER — Telehealth (INDEPENDENT_AMBULATORY_CARE_PROVIDER_SITE_OTHER): Payer: Self-pay

## 2011-07-19 NOTE — Telephone Encounter (Signed)
Pt notified labs OK

## 2011-11-15 ENCOUNTER — Other Ambulatory Visit: Payer: Self-pay | Admitting: Family Medicine

## 2011-11-15 DIAGNOSIS — Z1231 Encounter for screening mammogram for malignant neoplasm of breast: Secondary | ICD-10-CM

## 2011-11-30 ENCOUNTER — Ambulatory Visit
Admission: RE | Admit: 2011-11-30 | Discharge: 2011-11-30 | Disposition: A | Discharge status: Home / Self Care | Payer: BC Managed Care – PPO | Source: Ambulatory Visit | Attending: Family Medicine | Admitting: Family Medicine

## 2011-11-30 DIAGNOSIS — Z1231 Encounter for screening mammogram for malignant neoplasm of breast: Secondary | ICD-10-CM

## 2012-01-01 ENCOUNTER — Telehealth (INDEPENDENT_AMBULATORY_CARE_PROVIDER_SITE_OTHER): Payer: Self-pay

## 2012-01-01 NOTE — Telephone Encounter (Signed)
Rescheduled patient to 10/18 with Dr. Donell Beers.  She may need a refill on her Creon before the appointment so I told her to call.  Also to remind me to give her a coupon.

## 2012-01-07 ENCOUNTER — Ambulatory Visit (INDEPENDENT_AMBULATORY_CARE_PROVIDER_SITE_OTHER): Payer: BC Managed Care – PPO | Admitting: General Surgery

## 2012-01-31 ENCOUNTER — Ambulatory Visit (INDEPENDENT_AMBULATORY_CARE_PROVIDER_SITE_OTHER): Payer: BC Managed Care – PPO | Admitting: Family Medicine

## 2012-01-31 ENCOUNTER — Encounter: Payer: Self-pay | Admitting: Family Medicine

## 2012-01-31 VITALS — BP 120/85 | HR 71 | Temp 97.9°F | Ht 63.0 in | Wt 105.6 lb

## 2012-01-31 DIAGNOSIS — Z0000 Encounter for general adult medical examination without abnormal findings: Secondary | ICD-10-CM

## 2012-01-31 DIAGNOSIS — Z23 Encounter for immunization: Secondary | ICD-10-CM

## 2012-01-31 LAB — CBC WITH DIFFERENTIAL/PLATELET
Basophils Absolute: 0.2 10*3/uL — ABNORMAL HIGH (ref 0.0–0.1)
Basophils Relative: 1.2 % (ref 0.0–3.0)
Eosinophils Absolute: 0.2 10*3/uL (ref 0.0–0.7)
Eosinophils Relative: 1 % (ref 0.0–5.0)
HCT: 43.1 % (ref 36.0–46.0)
Hemoglobin: 13.6 g/dL (ref 12.0–15.0)
Lymphocytes Relative: 32.9 % (ref 12.0–46.0)
Lymphs Abs: 5 10*3/uL — ABNORMAL HIGH (ref 0.7–4.0)
MCHC: 31.5 g/dL (ref 30.0–36.0)
MCV: 90.8 fl (ref 78.0–100.0)
Monocytes Absolute: 1.3 10*3/uL — ABNORMAL HIGH (ref 0.1–1.0)
Monocytes Relative: 8.6 % (ref 3.0–12.0)
Neutro Abs: 8.6 10*3/uL — ABNORMAL HIGH (ref 1.4–7.7)
Neutrophils Relative %: 56.3 % (ref 43.0–77.0)
Platelets: 378 10*3/uL (ref 150.0–400.0)
RBC: 4.75 Mil/uL (ref 3.87–5.11)
RDW: 15.1 % — ABNORMAL HIGH (ref 11.5–14.6)
WBC: 15.3 10*3/uL — ABNORMAL HIGH (ref 4.5–10.5)

## 2012-01-31 LAB — BASIC METABOLIC PANEL
BUN: 12 mg/dL (ref 6–23)
CO2: 26 mEq/L (ref 19–32)
Calcium: 9.4 mg/dL (ref 8.4–10.5)
Chloride: 103 mEq/L (ref 96–112)
Creatinine, Ser: 0.7 mg/dL (ref 0.4–1.2)
GFR: 97.7 mL/min (ref >60.00)
Glucose, Bld: 88 mg/dL (ref 70–99)
Potassium: 4.3 mEq/L (ref 3.5–5.1)
Sodium: 137 mEq/L (ref 135–145)

## 2012-01-31 LAB — HEPATIC FUNCTION PANEL
ALT: 18 U/L (ref 0–35)
AST: 22 U/L (ref 0–37)
Albumin: 4 g/dL (ref 3.5–5.2)
Alkaline Phosphatase: 80 U/L (ref 39–117)
Bilirubin, Direct: 0 mg/dL (ref 0.0–0.3)
Total Bilirubin: 0.4 mg/dL (ref 0.3–1.2)
Total Protein: 7.9 g/dL (ref 6.0–8.3)

## 2012-01-31 LAB — LIPID PANEL
Cholesterol: 171 mg/dL (ref 0–200)
HDL: 49.4 mg/dL (ref >39.00)
LDL Cholesterol: 106 mg/dL — ABNORMAL HIGH (ref 0–99)
Total CHOL/HDL Ratio: 3
Triglycerides: 79 mg/dL (ref 0.0–149.0)
VLDL: 15.8 mg/dL (ref 0.0–40.0)

## 2012-01-31 LAB — TSH: TSH: 2.53 u[IU]/mL (ref 0.35–5.50)

## 2012-01-31 NOTE — Assessment & Plan Note (Signed)
Normal PE w/ exception of fibromatosis.  Check labs.  UTD on health maintenance.  Anticipatory guidance provided.

## 2012-01-31 NOTE — Progress Notes (Signed)
  Subjective:    Patient ID: [name] [name], female    DOB: 07-06-1955, 56 y.o.   MRN: [id number]  HPI CPE- UTD on mammo, colonoscopy.  S/p TAH-BSO.     Review of Systems Patient reports no vision/ hearing changes, adenopathy,fever, weight change,  persistant/recurrent hoarseness , swallowing issues, chest pain, palpitations, edema, persistant/recurrent cough, hemoptysis, dyspnea (rest/exertional/paroxysmal nocturnal), gastrointestinal bleeding (melena, rectal bleeding), abdominal pain, significant heartburn, bowel changes, GU symptoms (dysuria, hematuria, incontinence), Gyn symptoms (abnormal  bleeding, pain),  syncope, focal weakness, memory loss, numbness & tingling, skin/hair/nail changes, abnormal bruising or bleeding, anxiety, or depression.     Objective:   Physical Exam General Appearance:    Alert, cooperative, no distress, appears stated age  Head:    Normocephalic, without obvious abnormality, atraumatic  Eyes:    PERRL, conjunctiva/corneas clear, EOM's intact, fundi    benign, both eyes  Ears:    L TM obscured by impacted cerumen, R TM WNL  Nose:   Nares normal, septum midline, mucosa normal, no drainage    or sinus tenderness  Throat:   Lips, mucosa, and tongue normal; teeth and gums normal  Neck:   Supple, symmetrical, trachea midline, no adenopathy;    Thyroid: no enlargement/tenderness/nodules  Back:     Symmetric, no curvature, ROM normal, no CVA tenderness  Lungs:     Clear to auscultation bilaterally, respirations unlabored  Chest Wall:    No tenderness or deformity   Heart:    Regular rate and rhythm, S1 and S2 normal, no murmur, rub   or gallop  Breast Exam:    Deferred to GYN  Abdomen:     Soft, non-tender, bowel sounds active all four quadrants,    no masses, no organomegaly  Genitalia:    Deferred to GYN  Rectal:    Extremities:   Extremities normal, atraumatic, no cyanosis or edema  Pulses:   2+ and symmetric all extremities  Skin:   Skin color, texture,  turgor normal, no rashes, diffusely scattered neurofibromas  Lymph nodes:   Cervical, supraclavicular, and axillary nodes normal  Neurologic:   CNII-XII intact, normal strength, sensation and reflexes    throughout          Assessment & Plan:

## 2012-01-31 NOTE — Patient Instructions (Addendum)
Follow up in 1 year or as needed We'll notify you of your lab results and make any changes if needed Keep up the good work! Quit smoking!!  You can do it! Call with any questions or concerns Happy Fall!!!

## 2012-02-04 LAB — VITAMIN D 1,25 DIHYDROXY
Vitamin D 1, 25 (OH)2 Total: 61 pg/mL (ref 18–72)
Vitamin D2 1, 25 (OH)2: <8 pg/mL
Vitamin D3 1, 25 (OH)2: 61 pg/mL

## 2012-02-05 ENCOUNTER — Encounter: Payer: Self-pay | Admitting: *Deleted

## 2012-02-08 ENCOUNTER — Encounter (INDEPENDENT_AMBULATORY_CARE_PROVIDER_SITE_OTHER): Payer: BC Managed Care – PPO | Admitting: General Surgery

## 2012-02-11 ENCOUNTER — Ambulatory Visit (INDEPENDENT_AMBULATORY_CARE_PROVIDER_SITE_OTHER): Payer: BC Managed Care – PPO | Admitting: General Surgery

## 2012-02-11 VITALS — BP 142/86 | HR 72 | Temp 98.0°F | Resp 18 | Ht 62.5 in | Wt 104.4 lb

## 2012-02-11 DIAGNOSIS — K8689 Other specified diseases of pancreas: Secondary | ICD-10-CM

## 2012-02-11 DIAGNOSIS — K8681 Exocrine pancreatic insufficiency: Secondary | ICD-10-CM

## 2012-02-11 DIAGNOSIS — C170 Malignant neoplasm of duodenum: Secondary | ICD-10-CM

## 2012-02-11 MED ORDER — PANCRELIPASE (LIP-PROT-AMYL) 24000-76000 UNITS PO CPEP: ORAL_CAPSULE | ORAL | Status: DC

## 2012-02-11 NOTE — Assessment & Plan Note (Signed)
Symptoms of weight loss and loose stools managed with current dose of creon.  Will reorder.

## 2012-02-11 NOTE — Progress Notes (Signed)
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  Several of the largest ones and these were removed for diagnosis however they could not all be removed without making her have short gut syndrome. None of these have shown up on her prior CT scans. It has been a year since her last CT scan. She denies any obstructive symptoms. She is not having any blood in her stools.  She has been taking her Creon. This is controlling her diarrhea and her weight loss.  She has not had any new medical problems. She has had a CBC drawn recently with an elevated white blood cell count which is going to be redrawn by Dr. Rennis Golden office.  PERTINENT REVIEW OF SYSTEMS: Otherwise negative.   EXAM: Head: Normocephalic and atraumatic.  Eyes:  Conjunctivae are normal. Pupils are equal, round, and reactive to light. No scleral icterus.  Neck:  Normal range of motion. Neck supple. No tracheal deviation present. No thyromegaly present.  Resp: No respiratory distress, normal effort. Abd:  Abdomen is soft, non distended and non tender. No masses are palpable.  There is no rebound and no guarding. no hernia at incision.   Neurological: Alert and oriented to person, place, and time. Coordination normal.  Skin: Skin is warm and dry. No rash noted. No diaphoretic. No erythema. No pallor. No peripheral edema.  Numerous neurofibromas diffusely over body.   Psychiatric: Normal mood and affect. Normal behavior. Judgment and thought content normal.    Chronogranin A- normal, LFTs normal, BMET normal on 01/31/2012  ASSESSMENT AND PLAN:   Exocrine pancreatic insufficiency Symptoms of weight loss and loose stools managed with current dose of creon.  Will reorder.  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  Will do CT scan to evaluate for recurrent tumor.    Follow up in 12 months.  Pt has had CBC recently.         Maudry Diego, MD Surgical Oncology, General & Endocrine Surgery [facility]  Neena Rhymes, MD Sheliah Hatch, MD

## 2012-02-11 NOTE — Patient Instructions (Signed)
Follow up in 12 months unless having issues.    Call if creon co pay goes up.    We will get CT scan to look for recurrent tumor.

## 2012-02-11 NOTE — Assessment & Plan Note (Signed)
Pt without symptoms of cancer recurrence.  Will do CT scan to evaluate for recurrent tumor.    Follow up in 12 months.  Pt has had CBC recently.

## 2012-02-14 ENCOUNTER — Other Ambulatory Visit (INDEPENDENT_AMBULATORY_CARE_PROVIDER_SITE_OTHER): Payer: BC Managed Care – PPO

## 2012-02-14 DIAGNOSIS — D72829 Elevated white blood cell count, unspecified: Secondary | ICD-10-CM

## 2012-02-14 DIAGNOSIS — D72820 Lymphocytosis (symptomatic): Secondary | ICD-10-CM

## 2012-02-14 LAB — CBC WITH DIFFERENTIAL/PLATELET
Basophils Absolute: 0.1 10*3/uL (ref 0.0–0.1)
Basophils Relative: 0.6 % (ref 0.0–3.0)
Eosinophils Absolute: 0.2 10*3/uL (ref 0.0–0.7)
Eosinophils Relative: 1.1 % (ref 0.0–5.0)
HCT: 43.2 % (ref 36.0–46.0)
Hemoglobin: 14 g/dL (ref 12.0–15.0)
Lymphocytes Relative: 24.2 % (ref 12.0–46.0)
Lymphs Abs: 4.1 10*3/uL — ABNORMAL HIGH (ref 0.7–4.0)
MCHC: 32.3 g/dL (ref 30.0–36.0)
MCV: 90.2 fl (ref 78.0–100.0)
Monocytes Absolute: 1.7 10*3/uL — ABNORMAL HIGH (ref 0.1–1.0)
Monocytes Relative: 10.1 % (ref 3.0–12.0)
Neutro Abs: 10.9 10*3/uL — ABNORMAL HIGH (ref 1.4–7.7)
Neutrophils Relative %: 64 % (ref 43.0–77.0)
Platelets: 352 10*3/uL (ref 150.0–400.0)
RBC: 4.8 Mil/uL (ref 3.87–5.11)
RDW: 14.8 % — ABNORMAL HIGH (ref 11.5–14.6)
WBC: 17.1 10*3/uL — ABNORMAL HIGH (ref 4.5–10.5)

## 2012-02-15 ENCOUNTER — Ambulatory Visit
Admission: RE | Admit: 2012-02-15 | Discharge: 2012-02-15 | Disposition: A | Discharge status: Home / Self Care | Payer: BC Managed Care – PPO | Source: Ambulatory Visit | Attending: General Surgery | Admitting: General Surgery

## 2012-02-15 ENCOUNTER — Telehealth: Payer: Self-pay

## 2012-02-15 DIAGNOSIS — C170 Malignant neoplasm of duodenum: Secondary | ICD-10-CM

## 2012-02-15 MED ORDER — IOHEXOL 300 MG/ML  SOLN
100.0000 mL | Freq: Once | INTRAMUSCULAR | Status: AC | PRN
  Administered 2012-02-15: 100 mL via INTRAVENOUS

## 2012-02-15 NOTE — Telephone Encounter (Signed)
Pt returned kim call. I advised pt on lab results: Notes Recorded by Sheliah Hatch, MD on 02/14/2012 at 4:35 PM WBC is even higher than previous. Please ask pt how she is feeling. Based on this, will refer to hematology (dx elevated WBC- this seems to be chronic for pt)  I advised pt a referal for hematology will be put in and they will call to schedule appt. Pt stated understanding. Pt states still feeling okay no changes.     MW

## 2012-02-18 NOTE — Addendum Note (Signed)
Addended by: Derry Lory A on: 02/18/2012 10:16 AM   Modules accepted: Orders

## 2012-02-20 ENCOUNTER — Other Ambulatory Visit (INDEPENDENT_AMBULATORY_CARE_PROVIDER_SITE_OTHER): Payer: Self-pay | Admitting: General Surgery

## 2012-02-20 ENCOUNTER — Telehealth (INDEPENDENT_AMBULATORY_CARE_PROVIDER_SITE_OTHER): Payer: Self-pay | Admitting: General Surgery

## 2012-02-20 DIAGNOSIS — D3A8 Other benign neuroendocrine tumors: Secondary | ICD-10-CM

## 2012-02-20 DIAGNOSIS — C49A0 Gastrointestinal stromal tumor, unspecified site: Secondary | ICD-10-CM

## 2012-02-20 NOTE — Telephone Encounter (Signed)
Discussed CT with pt.  Will need MRI to assess lesion.  If positive, advised pt that we will need biopsy.

## 2012-02-21 ENCOUNTER — Telehealth: Payer: Self-pay | Admitting: Hematology & Oncology

## 2012-02-21 NOTE — Telephone Encounter (Signed)
Received referral fro Dr Lavonna Rua. Pt is a pt of Dr. Truett Perna. She wants to see him. Kim in medical records is aware and will contact pt for appointment.

## 2012-02-22 ENCOUNTER — Ambulatory Visit
Admission: RE | Admit: 2012-02-22 | Discharge: 2012-02-22 | Disposition: A | Discharge status: Home / Self Care | Payer: BC Managed Care – PPO | Source: Ambulatory Visit | Attending: General Surgery | Admitting: General Surgery

## 2012-02-22 DIAGNOSIS — C49A0 Gastrointestinal stromal tumor, unspecified site: Secondary | ICD-10-CM

## 2012-02-22 DIAGNOSIS — D3A8 Other benign neuroendocrine tumors: Secondary | ICD-10-CM

## 2012-02-22 MED ORDER — GADOXETATE DISODIUM 0.25 MMOL/ML IV SOLN
5.0000 mL | Freq: Once | INTRAVENOUS | Status: AC | PRN
  Administered 2012-02-22: 5 mL via INTRAVENOUS

## 2012-02-25 ENCOUNTER — Telehealth (INDEPENDENT_AMBULATORY_CARE_PROVIDER_SITE_OTHER): Payer: Self-pay | Admitting: General Surgery

## 2012-02-25 NOTE — Telephone Encounter (Signed)
Patient calling and asking for [name] [name] Numbers to get test results. Please call when you are available. [phone].

## 2012-02-26 ENCOUNTER — Telehealth (INDEPENDENT_AMBULATORY_CARE_PROVIDER_SITE_OTHER): Payer: Self-pay | Admitting: General Surgery

## 2012-02-26 DIAGNOSIS — K769 Liver disease, unspecified: Secondary | ICD-10-CM

## 2012-02-26 NOTE — Telephone Encounter (Signed)
Will set up biopsy in interventional radiology.  Discussed procedure with her.  Discussed that this may be metastatic disease.

## 2012-02-27 ENCOUNTER — Other Ambulatory Visit (INDEPENDENT_AMBULATORY_CARE_PROVIDER_SITE_OTHER): Payer: Self-pay | Admitting: General Surgery

## 2012-02-27 ENCOUNTER — Encounter (HOSPITAL_COMMUNITY): Payer: Self-pay

## 2012-02-27 DIAGNOSIS — R160 Hepatomegaly, not elsewhere classified: Secondary | ICD-10-CM

## 2012-02-28 ENCOUNTER — Telehealth: Payer: Self-pay | Admitting: Oncology

## 2012-02-28 ENCOUNTER — Other Ambulatory Visit: Payer: Self-pay | Admitting: Radiology

## 2012-02-28 NOTE — Telephone Encounter (Signed)
S/W pt in re appt w/Dr. Truett Perna 11/18 @ 11:30.  Calendar mailed.

## 2012-02-29 ENCOUNTER — Ambulatory Visit (HOSPITAL_COMMUNITY)
Admission: RE | Admit: 2012-02-29 | Discharge: 2012-02-29 | Disposition: A | Discharge status: Home / Self Care | Payer: BC Managed Care – PPO | Source: Ambulatory Visit | Attending: General Surgery | Admitting: General Surgery

## 2012-02-29 ENCOUNTER — Encounter (HOSPITAL_COMMUNITY): Payer: Self-pay

## 2012-02-29 DIAGNOSIS — R160 Hepatomegaly, not elsewhere classified: Secondary | ICD-10-CM

## 2012-02-29 DIAGNOSIS — K769 Liver disease, unspecified: Secondary | ICD-10-CM | POA: Insufficient documentation

## 2012-02-29 LAB — CBC
HCT: 40.4 % (ref 36.0–46.0)
Hemoglobin: 13.9 g/dL (ref 12.0–15.0)
MCH: 29.3 pg (ref 26.0–34.0)
MCHC: 34.4 g/dL (ref 30.0–36.0)
MCV: 85.1 fL (ref 78.0–100.0)
Platelets: 398 10*3/uL (ref 150–400)
RBC: 4.75 MIL/uL (ref 3.87–5.11)
RDW: 14 % (ref 11.5–15.5)
WBC: 14.1 10*3/uL — ABNORMAL HIGH (ref 4.0–10.5)

## 2012-02-29 LAB — APTT: aPTT: 35 seconds (ref 24–37)

## 2012-02-29 LAB — PROTIME-INR
INR: 0.99 (ref 0.00–1.49)
Prothrombin Time: 13 seconds (ref 11.6–15.2)

## 2012-02-29 MED ORDER — SODIUM CHLORIDE 0.9 % IV SOLN
Freq: Once | INTRAVENOUS | Status: AC
  Administered 2012-02-29: 08:00:00 via INTRAVENOUS

## 2012-02-29 MED ORDER — MIDAZOLAM HCL 2 MG/2ML IJ SOLN
INTRAMUSCULAR | Status: AC | PRN
  Administered 2012-02-29 (×4): 1 mg via INTRAVENOUS

## 2012-02-29 MED ORDER — MIDAZOLAM HCL 2 MG/2ML IJ SOLN
INTRAMUSCULAR | Status: AC
  Filled 2012-02-29: qty 4

## 2012-02-29 MED ORDER — FENTANYL CITRATE 0.05 MG/ML IJ SOLN
INTRAMUSCULAR | Status: AC
  Filled 2012-02-29: qty 2

## 2012-02-29 MED ORDER — FENTANYL CITRATE 0.05 MG/ML IJ SOLN
INTRAMUSCULAR | Status: AC | PRN
  Administered 2012-02-29 (×4): 25 ug via INTRAVENOUS

## 2012-02-29 MED ORDER — MIDAZOLAM HCL 2 MG/2ML IJ SOLN
INTRAMUSCULAR | Status: AC
  Filled 2012-02-29: qty 2

## 2012-02-29 MED ORDER — HYDROCODONE-ACETAMINOPHEN 5-325 MG PO TABS: 1.0000 | ORAL_TABLET | ORAL | Status: DC | PRN

## 2012-02-29 NOTE — H&P (Signed)
[name] [name] is an 56 y.o. female.   Chief Complaint: Leukocytosis Previous Whipple procedure 12/25/09 Neuroendocrine tumor of duodenum CT shows liver lesion Scheduled for biopsy HPI: +smoker; Duodenal ca  Past Medical History  Diagnosis Date  . Carcinoid tumor of abdomen     Past Surgical History  Procedure Date  . Lung surgery 2008?  Marland Kitchen Oophorectomy   . Whipple procedure   . Abdominal hysterectomy     Patient does not remember date of procedure    Family History  Problem Relation Age of Onset  . Cancer Mother   . Cancer Father   . Heart disease Father   . Cancer Sister   . Heart disease Sister   . Diabetes Brother    Social History:  reports that she has been smoking Cigarettes.  She has been smoking about 1 pack per day. She has never used smokeless tobacco. She reports that she drinks alcohol. Her drug history not on file.  Allergies: No Known Allergies   (Not in a hospital admission)  No results found for this or any previous visit (from the past 48 hour(s)). No results found.  Review of Systems  Constitutional: Positive for weight loss. Negative for fever.  Respiratory: Negative for shortness of breath.   Cardiovascular: Negative for chest pain.  Gastrointestinal: Positive for abdominal pain. Negative for nausea and vomiting.  Neurological: Negative for weakness.    There were no vitals taken for this visit. Physical Exam  Constitutional: She is oriented to person, place, and time.       Thin; frail  Cardiovascular: Normal rate, regular rhythm and normal heart sounds.   Respiratory: Effort normal. She has wheezes.  GI: Soft. Bowel sounds are normal. There is no tenderness.  Musculoskeletal: Normal range of motion.  Neurological: She is alert and oriented to person, place, and time.  Psychiatric: She has a normal mood and affect. Her behavior is normal. Judgment and thought content normal.     Assessment/Plan Whipple 12/2011 Neuroendocrine tumor  duodenum Leukocytosis and abn CT Liver lesion biopsy today Pt aware of procedure benefits and risks and agreeable to proceed Consent signed and in chart  Leyton Magoon A 02/29/2012, 8:05 AM

## 2012-02-29 NOTE — H&P (Signed)
Agree with PA note.  Lesion will be very challenging to access.  It is high and medial in the right hepatic lobe abutting the diaphragm and very close to the right atrium.  Pt is aware that we may not be able to get a good sample.   Signed,  Sterling Big, MD Vascular & Interventional Radiologist [facility]

## 2012-02-29 NOTE — Procedures (Signed)
Interventional Radiology Procedure Note  Procedure: CT guided core biopsy of liver lesion Complications: None Recommendations: - Bedrest 3 hrs right side down - ADAT - Pathology pending  Signed,  Sterling Big, MD Vascular & Interventional Radiologist [facility]

## 2012-03-04 ENCOUNTER — Telehealth (INDEPENDENT_AMBULATORY_CARE_PROVIDER_SITE_OTHER): Payer: Self-pay

## 2012-03-04 NOTE — Telephone Encounter (Signed)
Pt called for bx results.  Dr. Colonel Bald has not signed off on these.  Pt will be contacted when results are final.

## 2012-03-05 ENCOUNTER — Telehealth (INDEPENDENT_AMBULATORY_CARE_PROVIDER_SITE_OTHER): Payer: Self-pay

## 2012-03-05 NOTE — Telephone Encounter (Signed)
Had a conversation with the patient to explain her pathology results which were benign.  Dr. Donell Beers would like her to have a f/u MRI in three months.  I told the patient we would contact her.

## 2012-03-10 ENCOUNTER — Ambulatory Visit (HOSPITAL_BASED_OUTPATIENT_CLINIC_OR_DEPARTMENT_OTHER): Payer: BC Managed Care – PPO | Admitting: Oncology

## 2012-03-10 ENCOUNTER — Telehealth: Payer: Self-pay | Admitting: Oncology

## 2012-03-10 VITALS — BP 140/82 | HR 86 | Temp 97.7°F | Resp 18 | Ht 62.5 in | Wt 104.5 lb

## 2012-03-10 DIAGNOSIS — D481 Neoplasm of uncertain behavior of connective and other soft tissue: Secondary | ICD-10-CM

## 2012-03-10 DIAGNOSIS — C7A092 Malignant carcinoid tumor of the stomach: Secondary | ICD-10-CM

## 2012-03-10 DIAGNOSIS — C170 Malignant neoplasm of duodenum: Secondary | ICD-10-CM

## 2012-03-10 DIAGNOSIS — R0789 Other chest pain: Secondary | ICD-10-CM

## 2012-03-10 NOTE — Telephone Encounter (Signed)
Gave pt appt for MD on  February 2014 , Ct before MD visit, informed pt that radiology will call patient , gave pt oral contrast

## 2012-03-10 NOTE — Progress Notes (Signed)
   [facility]    OFFICE PROGRESS NOTE   INTERVAL HISTORY:   She underwent a surveillance CT of the abdomen and pelvis 02/15/2012. Stable postoperative changes were noted. No lymphadenopathy in the abdomen or pelvis. A new ill-defined hypervascular lesion was seen in the anterior dome of the liver near the junction of the right and left lobes. She was referred for an MRI of the abdomen on 02/22/2012. A day T2 hyperintense lesion was noted in the anterior right hepatic dome suspicious for metastasis. Scattered tiny hepatic cysts. Left adrenal adenoma.  She was referred for a CT-guided biopsy of the liver lesion on 02/29/2012. Multiple core biopsies were obtained. The pathology revealed hepatic parenchyma with focal fibrosis and inflammation was sinusoidal dilatation. No evidence of metastatic pancreaticobiliary adenocarcinoma nor a neuroendocrine type tumor.  She reports intermittent discomfort in the right low anterior chest. This predates the diagnosis of cancer. The episodes happen approximately 3 times per week and lasts for 1 minute. No associated symptoms. Good appetite and energy level. No flushing or diarrhea. Intermittent nausea, no vomiting. Objective:  Vital signs in last 24 hours:  Blood pressure 140/82, pulse 86, temperature 97.7 F (36.5 C), temperature source Oral, resp. rate 18, height 5' 2.5" (1.588 m), weight 104 lb 8 oz (47.401 kg).    HEENT: Neck without mass Lymphatics: No cervical, supraclavicular, axillary, or inguinal nodes Resp: Lungs clear bilaterally Cardio: Regular rate and rhythm GI: No hepatosplenomegaly, no mass, nontender Vascular: No leg edema   Lab Results:  Lab Results  Component Value Date   WBC 14.1* 02/29/2012   HGB 13.9 02/29/2012   HCT 40.4 02/29/2012   MCV 85.1 02/29/2012   PLT 398 02/29/2012      Medications: I have reviewed the patient's current medications.  Assessment/Plan: 1. Well differentiated neuroendocrine  tumor, 1.7 cm (carcinoid tumor) arising at the duodenum with direct extension to the pancreas, status post a pancreaticoduodenectomy 01/04/2010 with negative surgical margins. -restaging CT evaluation on 06/20/2011 showed no evidence of recurrent tumor.                 -restaging CT on 02/15/2012 with a new ill-defined hypervascular lesion in the dome of the liver, confirmed on a MRI 02/22/2012 -CT-guided biopsy of the liver lesion on 02/29/2012 with no evidence of malignancy 2. 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 3. 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 4. Ongoing tobacco use. 5. ? Common hepatic duct calculi versus anastomotic stricture on the CT 06/20/2011   Disposition:  She remains in clinical remission from the carcinoid tumor. No clinical evidence for progression of the abdominal gastrointestinal stromal tumors. The hepatic dome lesion was biopsied and the pathology is benign. She will be scheduled for a repeat CT of the abdomen at a three-month interval. I will see her after the restaging CT. I reviewed the CT and MRI images with her today.  The intermittent right low anterior chest discomfort is likely related to a benign musculoskeletal condition. She will contact us if this becomes more frequent.   Thornton Papas, MD  03/10/2012  12:35 PM

## 2012-03-11 NOTE — Progress Notes (Signed)
This encounter was created in error - please disregard.

## 2012-05-29 ENCOUNTER — Encounter (HOSPITAL_COMMUNITY): Payer: Self-pay

## 2012-05-29 ENCOUNTER — Ambulatory Visit (HOSPITAL_COMMUNITY)
Admission: RE | Admit: 2012-05-29 | Discharge: 2012-05-29 | Disposition: A | Discharge status: Home / Self Care | Payer: BC Managed Care – PPO | Source: Ambulatory Visit | Attending: Oncology | Admitting: Oncology

## 2012-05-29 DIAGNOSIS — J984 Other disorders of lung: Secondary | ICD-10-CM | POA: Insufficient documentation

## 2012-05-29 DIAGNOSIS — D3500 Benign neoplasm of unspecified adrenal gland: Secondary | ICD-10-CM | POA: Insufficient documentation

## 2012-05-29 DIAGNOSIS — M47814 Spondylosis without myelopathy or radiculopathy, thoracic region: Secondary | ICD-10-CM | POA: Insufficient documentation

## 2012-05-29 DIAGNOSIS — C170 Malignant neoplasm of duodenum: Secondary | ICD-10-CM

## 2012-05-29 DIAGNOSIS — K7689 Other specified diseases of liver: Secondary | ICD-10-CM | POA: Insufficient documentation

## 2012-05-29 MED ORDER — IOHEXOL 300 MG/ML  SOLN
100.0000 mL | Freq: Once | INTRAMUSCULAR | Status: AC | PRN
  Administered 2012-05-29: 100 mL via INTRAVENOUS

## 2012-06-02 ENCOUNTER — Ambulatory Visit (HOSPITAL_BASED_OUTPATIENT_CLINIC_OR_DEPARTMENT_OTHER): Payer: BC Managed Care – PPO | Admitting: Oncology

## 2012-06-02 ENCOUNTER — Telehealth: Payer: Self-pay | Admitting: Oncology

## 2012-06-02 VITALS — BP 149/90 | HR 88 | Temp 96.9°F | Resp 20 | Ht 62.5 in | Wt 105.4 lb

## 2012-06-02 DIAGNOSIS — Z859 Personal history of malignant neoplasm, unspecified: Secondary | ICD-10-CM

## 2012-06-02 DIAGNOSIS — C170 Malignant neoplasm of duodenum: Secondary | ICD-10-CM

## 2012-06-02 NOTE — Progress Notes (Signed)
[facility]    OFFICE PROGRESS NOTE   INTERVAL HISTORY:   She returns as scheduled. She feels well. She continues to have occasional discomfort at the right low anterior chest. The pain occurs every few weeks and lasts for "minutes ". No other complaint.  Objective:  Vital signs in last 24 hours:  Blood pressure 149/90, pulse 88, temperature 96.9 F (36.1 C), temperature source Oral, resp. rate 20, height 5' 2.5" (1.588 m), weight 105 lb 6.4 oz (47.809 kg).    HEENT: Neck without mass Lymphatics: No cervical, supraclavicular, axillary, or inguinal nodes Resp: Lungs clear bilaterally Cardio: Regular rate and rhythm GI: No hepatomegaly, no mass, nontender Vascular: No leg edema Musculoskeletal: No tenderness at the right lower anterior chest wall    Portacath/PICC-without erythema   x-rays: CT abdomen on 05/29/2012-compared to a CT on 02/22/2012 the previously described 2.1 cm lesion in the right hepatic dome now measures 5 mm. No ascites, no adenopathy.   Medications: I have reviewed the patient's current medications.  Assessment/Plan: 1. 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                                                                       -restaging CT evaluation on 06/20/2011 showed no evidence of recurrent tumor.                                                                              -restaging CT on 02/15/2012 with a new ill-defined hypervascular lesion in the dome of the liver, confirmed on a MRI 02/22/2012 -CT-guided biopsy of the liver lesion on 02/29/2012 with no evidence of malignancy                                                                                        -restaging CT 05/29/2012 confirmed a decrease in the size of the previously noted hepatic dome lesion   2. 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 3. 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 4. Ongoing tobacco use. 5. ? Common hepatic duct calculi versus anastomotic stricture on the CT 06/20/2011  Disposition:  She remains in clinical remission from the carcinoid tumor and small bowel GISTs. The hepatic dome lesion is smaller and likely represents a benign finding.  [name] [name] will return for an office visit in 6 months. She will contact us in the interim as needed.   Thornton Papas, MD  06/02/2012  12:05  PM

## 2012-06-02 NOTE — Telephone Encounter (Signed)
gv and printed appt schedule for pt for Aug °

## 2012-07-03 ENCOUNTER — Ambulatory Visit: Payer: BC Managed Care – PPO | Admitting: Oncology

## 2012-10-15 ENCOUNTER — Other Ambulatory Visit (INDEPENDENT_AMBULATORY_CARE_PROVIDER_SITE_OTHER): Payer: Self-pay | Admitting: General Surgery

## 2012-11-04 ENCOUNTER — Other Ambulatory Visit: Payer: Self-pay

## 2012-11-04 DIAGNOSIS — Z1231 Encounter for screening mammogram for malignant neoplasm of breast: Secondary | ICD-10-CM

## 2012-12-01 ENCOUNTER — Telehealth: Payer: Self-pay | Admitting: Oncology

## 2012-12-01 ENCOUNTER — Ambulatory Visit (HOSPITAL_BASED_OUTPATIENT_CLINIC_OR_DEPARTMENT_OTHER): Payer: BC Managed Care – PPO | Admitting: Oncology

## 2012-12-01 VITALS — BP 135/68 | HR 92 | Temp 97.6°F | Resp 18 | Ht 62.5 in | Wt 108.9 lb

## 2012-12-01 DIAGNOSIS — Z859 Personal history of malignant neoplasm, unspecified: Secondary | ICD-10-CM

## 2012-12-01 DIAGNOSIS — C170 Malignant neoplasm of duodenum: Secondary | ICD-10-CM

## 2012-12-01 DIAGNOSIS — F17200 Nicotine dependence, unspecified, uncomplicated: Secondary | ICD-10-CM

## 2012-12-01 NOTE — Progress Notes (Signed)
   [facility]    OFFICE PROGRESS NOTE   INTERVAL HISTORY:   She returns as scheduled. She has noted "bloating" at the lower abdomen for the past few weeks. Mild nausea. No difficulty with bowel or bladder function. She continues to smoke approximately one pack of cigarettes per day.  Objective:  Vital signs in last 24 hours:  Blood pressure 135/68, pulse 92, temperature 97.6 F (36.4 C), temperature source Oral, resp. rate 18, height 5' 2.5" (1.588 m), weight 108 lb 14.4 oz (49.397 kg), SpO2 100.00%.    HEENT: Neck without mass Lymphatics: No cervical, supraclavicular, axillary, or inguinal nodes. Question 1 cm cyst versus neurofibromatosis lesion at the medial left axilla Resp: Slight decrease in breath sounds at the right base, no inspiratory distress Cardio: Regular rate and rhythm GI: No hepatosplenomegaly, no apparent ascites, no mass, mild tenderness in the low mid abdomen Vascular: No leg edema  Skin: Diffuse neurofibromatosis lesions     Medications: I have reviewed the patient's current medications.  Assessment/Plan: 1. 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 -restaging CT evaluation on 06/20/2011 showed no evidence of recurrent tumor. -restaging CT on 02/15/2012 with a new ill-defined hypervascular lesion in the dome of the liver, confirmed on a MRI 02/22/2012 -CT-guided biopsy of the liver lesion on 02/29/2012 with no evidence of malignancy -Restaging CT to 05/29/2012 confirmed a decrease in the size of the previously noted hepatic dome lesion 2. 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 3. 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 4. Ongoing tobacco use. 5. ? Common hepatic duct calculi versus anastomotic  stricture on the CT 06/20/2011  Disposition:  She remains in clinical remission from the carcinoid tumor. She also has a history of abdominal gastrointestinal stromal tumors. Ms. Drummer will contact us if the abdominal "bloating" but comes consistent or she develops new symptoms. She will return for an office visit in 6 months. I counseled her to discontinue smoking.  Thornton Papas, MD  12/01/2012  9:13 AM

## 2012-12-01 NOTE — Telephone Encounter (Signed)
gave pt appt for february 2015, no labs, Md only

## 2012-12-02 ENCOUNTER — Ambulatory Visit
Admission: RE | Admit: 2012-12-02 | Discharge: 2012-12-02 | Disposition: A | Discharge status: Home / Self Care | Payer: BC Managed Care – PPO | Source: Ambulatory Visit

## 2012-12-02 DIAGNOSIS — Z1231 Encounter for screening mammogram for malignant neoplasm of breast: Secondary | ICD-10-CM

## 2013-01-05 ENCOUNTER — Telehealth (INDEPENDENT_AMBULATORY_CARE_PROVIDER_SITE_OTHER): Payer: Self-pay

## 2013-01-05 ENCOUNTER — Other Ambulatory Visit (INDEPENDENT_AMBULATORY_CARE_PROVIDER_SITE_OTHER): Payer: Self-pay

## 2013-01-05 NOTE — Telephone Encounter (Signed)
Refill for Creon called to [facility].  The patient is aware.

## 2013-02-10 ENCOUNTER — Ambulatory Visit (INDEPENDENT_AMBULATORY_CARE_PROVIDER_SITE_OTHER): Payer: BC Managed Care – PPO | Admitting: General Surgery

## 2013-02-10 ENCOUNTER — Encounter (INDEPENDENT_AMBULATORY_CARE_PROVIDER_SITE_OTHER): Payer: Self-pay | Admitting: General Surgery

## 2013-02-10 ENCOUNTER — Other Ambulatory Visit (INDEPENDENT_AMBULATORY_CARE_PROVIDER_SITE_OTHER): Payer: Self-pay | Admitting: General Surgery

## 2013-02-10 VITALS — BP 130/60 | HR 68 | Resp 16 | Ht 63.0 in | Wt 111.4 lb

## 2013-02-10 DIAGNOSIS — K8689 Other specified diseases of pancreas: Secondary | ICD-10-CM

## 2013-02-10 DIAGNOSIS — Z8509 Personal history of malignant neoplasm of other digestive organs: Secondary | ICD-10-CM

## 2013-02-10 DIAGNOSIS — K8681 Exocrine pancreatic insufficiency: Secondary | ICD-10-CM

## 2013-02-10 DIAGNOSIS — C49A3 Gastrointestinal stromal tumor of small intestine: Secondary | ICD-10-CM

## 2013-02-10 DIAGNOSIS — C170 Malignant neoplasm of duodenum: Secondary | ICD-10-CM

## 2013-02-10 MED ORDER — PANCRELIPASE (LIP-PROT-AMYL) 24000-76000 UNITS PO CPEP: 2.0000 | ORAL_CAPSULE | Freq: Three times a day (TID) | ORAL | Status: DC

## 2013-02-10 NOTE — Progress Notes (Signed)
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  Several of the largest ones and these were removed for diagnosis however they could not all be removed without making her have short gut syndrome. None of these have shown up on her prior CT scans.  She denies any obstructive symptoms. She is not having any blood in her stools.  She has been taking her Creon. This is controlling her diarrhea and her weight loss.    She had a liver lesion seen on her last CT, this was biopsied and was benign.  MR did not demonstrate any other areas of concern.  She is doing well overall.     PERTINENT REVIEW OF SYSTEMS: Otherwise negative.   EXAM: Head: Normocephalic and atraumatic.  Eyes:  Conjunctivae are normal. Pupils are equal, round, and reactive to light. No scleral icterus.  Neck:  Normal range of motion. Neck supple. No tracheal deviation present. No thyromegaly present.  Resp: No respiratory distress, normal effort. Abd:  Abdomen is soft, non distended and non tender. No masses are palpable.  There is no rebound and no guarding. no hernia at incision.   Neurological: Alert and oriented to person, place, and time. Coordination normal.  Skin: Skin is warm and dry. No rash noted. No diaphoretic. No erythema. No pallor. No peripheral edema.  Numerous neurofibromas diffusely over body.   Psychiatric: Normal mood and affect. Normal behavior. Judgment and thought content normal.    Chronogranin A- normal, LFTs normal, BMET normal on 01/31/2012  ASSESSMENT AND PLAN:   Exocrine pancreatic insufficiency Creon seems to be around the adequate dose.  I encouraged her to return dose to 2 with snacks and 3 with meals if she finds that she is having a lot of loose  stools.  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  She definitely has additional tumors left behind throughout her small bowel.      Neuroendocrine tumor of duodenum T4N0Mx, s/p whipple 01/04/2010 Repeat scan at the end of the year.         Maudry Diego, MD Surgical Oncology, General & Endocrine Surgery [facility]  Neena Rhymes, MD Sheliah Hatch, MD

## 2013-02-10 NOTE — Assessment & Plan Note (Signed)
Repeat scan at the end of the year.

## 2013-02-10 NOTE — Assessment & Plan Note (Signed)
Since the patient is asymptomatic, I will not go hunting for increasing size of her GIST tumors.  She definitely has additional tumors left behind throughout her small bowel.

## 2013-02-10 NOTE — Patient Instructions (Signed)
Continue creon.  Follow up in may of next year.    Get CT near end of year.

## 2013-02-10 NOTE — Assessment & Plan Note (Signed)
Creon seems to be around the adequate dose.  I encouraged her to return dose to 2 with snacks and 3 with meals if she finds that she is having a lot of loose stools.  I refilled her Creon prescription

## 2013-02-26 ENCOUNTER — Other Ambulatory Visit: Payer: Self-pay

## 2013-04-08 ENCOUNTER — Telehealth (INDEPENDENT_AMBULATORY_CARE_PROVIDER_SITE_OTHER): Payer: Self-pay | Admitting: *Deleted

## 2013-04-08 NOTE — Telephone Encounter (Signed)
I spoke with pt and informed her of the appt for her CT scan at [facility] on 12/22 with an arrival time of 11:30am.  I instructed pt to go by GI by the end of the week to pick up her contrast.  She is agreeable with this appt.

## 2013-04-13 ENCOUNTER — Telehealth (INDEPENDENT_AMBULATORY_CARE_PROVIDER_SITE_OTHER): Payer: Self-pay | Admitting: *Deleted

## 2013-04-13 ENCOUNTER — Ambulatory Visit
Admission: RE | Admit: 2013-04-13 | Discharge: 2013-04-13 | Disposition: A | Discharge status: Home / Self Care | Payer: BC Managed Care – PPO | Source: Ambulatory Visit | Attending: General Surgery | Admitting: General Surgery

## 2013-04-13 DIAGNOSIS — C49A3 Gastrointestinal stromal tumor of small intestine: Secondary | ICD-10-CM

## 2013-04-13 MED ORDER — IOHEXOL 300 MG/ML  SOLN
100.0000 mL | Freq: Once | INTRAMUSCULAR | Status: AC | PRN
  Administered 2013-04-13: 100 mL via INTRAVENOUS

## 2013-04-13 NOTE — Telephone Encounter (Signed)
CT scan given to Dr. Abbey Chatters to review at this time since Dr. Donell Beers is unavailable.

## 2013-04-13 NOTE — Telephone Encounter (Signed)
Dr. Abbey Chatters reviewed CT scan at this time and saw nothing urgent or significant. Will wait for Dr. Donell Beers to review when she is back in the office.

## 2013-04-21 ENCOUNTER — Telehealth (INDEPENDENT_AMBULATORY_CARE_PROVIDER_SITE_OTHER): Payer: Self-pay | Admitting: *Deleted

## 2013-04-21 ENCOUNTER — Other Ambulatory Visit (INDEPENDENT_AMBULATORY_CARE_PROVIDER_SITE_OTHER): Payer: Self-pay | Admitting: General Surgery

## 2013-04-21 DIAGNOSIS — C170 Malignant neoplasm of duodenum: Secondary | ICD-10-CM

## 2013-04-21 NOTE — Telephone Encounter (Signed)
I spoke with pt and informed her of the appt for her MRI at [facility] on 04/29/13 with an arrival time of 5:15pm.  She is agreeable with this appt.

## 2013-04-29 ENCOUNTER — Ambulatory Visit
Admission: RE | Admit: 2013-04-29 | Discharge: 2013-04-29 | Disposition: A | Discharge status: Home / Self Care | Payer: BC Managed Care – PPO | Source: Ambulatory Visit | Attending: General Surgery | Admitting: General Surgery

## 2013-04-29 DIAGNOSIS — C170 Malignant neoplasm of duodenum: Secondary | ICD-10-CM

## 2013-04-29 MED ORDER — GADOBENATE DIMEGLUMINE 529 MG/ML IV SOLN
10.0000 mL | Freq: Once | INTRAVENOUS | Status: AC | PRN
  Administered 2013-04-29: 10 mL via INTRAVENOUS

## 2013-04-30 ENCOUNTER — Other Ambulatory Visit (INDEPENDENT_AMBULATORY_CARE_PROVIDER_SITE_OTHER): Payer: Self-pay | Admitting: General Surgery

## 2013-04-30 ENCOUNTER — Telehealth (INDEPENDENT_AMBULATORY_CARE_PROVIDER_SITE_OTHER): Payer: Self-pay

## 2013-04-30 DIAGNOSIS — D3A8 Other benign neuroendocrine tumors: Secondary | ICD-10-CM

## 2013-04-30 NOTE — Progress Notes (Signed)
Quick Note:  Does not look like there is any evidence of cancer in the spine; It just looks like benign cystic areas and inflammation. I still would like to have a neurosurgeon review this. Will make referral. ______

## 2013-04-30 NOTE — Telephone Encounter (Signed)
LM for pt to call to discuss her recent MRI.  Please ask for [name] [name].

## 2013-06-02 ENCOUNTER — Encounter: Payer: Self-pay | Admitting: Oncology

## 2013-06-02 ENCOUNTER — Ambulatory Visit (HOSPITAL_BASED_OUTPATIENT_CLINIC_OR_DEPARTMENT_OTHER): Payer: BC Managed Care – PPO | Admitting: Oncology

## 2013-06-02 VITALS — BP 123/60 | HR 69 | Temp 97.1°F | Resp 18 | Ht 63.0 in | Wt 112.2 lb

## 2013-06-02 DIAGNOSIS — R229 Localized swelling, mass and lump, unspecified: Secondary | ICD-10-CM

## 2013-06-02 DIAGNOSIS — M949 Disorder of cartilage, unspecified: Secondary | ICD-10-CM

## 2013-06-02 DIAGNOSIS — M545 Low back pain, unspecified: Secondary | ICD-10-CM

## 2013-06-02 DIAGNOSIS — M899 Disorder of bone, unspecified: Secondary | ICD-10-CM

## 2013-06-02 DIAGNOSIS — C7A098 Malignant carcinoid tumors of other sites: Secondary | ICD-10-CM

## 2013-06-02 DIAGNOSIS — F17200 Nicotine dependence, unspecified, uncomplicated: Secondary | ICD-10-CM

## 2013-06-02 DIAGNOSIS — C170 Malignant neoplasm of duodenum: Secondary | ICD-10-CM

## 2013-06-02 DIAGNOSIS — Q8501 Neurofibromatosis, type 1: Secondary | ICD-10-CM

## 2013-06-02 NOTE — Progress Notes (Signed)
   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 Ms. Kosinski reports a good appetite. No diarrhea. She reports several episodes of nausea and vomiting over the past few months. Dysphagia or consistent nausea. A restaging CT of the abdomen and pelvis on 04/13/2013 revealed no evidence of recurrent carcinoid tumor or progressive gastrointestinal stromal tumor. Irregular-appearing soft tissue was noted at the costovertebral junction of the left T9 vertebral body.  She was referred for an MRI of the thoracic spine on 04/30/2013. This revealed a benign-appearing cystic lesion at the left T8-T9 level. She is scheduled to see Dr. Vertell Limber tomorrow. Ms. Reeb reports intermittent low back pain. Objective:  Vital signs in last 24 hours:  Blood pressure 123/60, pulse 69, temperature 97.1 F (36.2 C), temperature source Oral, resp. rate 18, height 5\' 3"  (1.6 m), weight 112 lb 3.2 oz (50.894 kg).    HEENT: Neck without mass Lymphatics: No cervical, supraclavicular, axillary, or inguinal nodes Resp: Lungs clear bilaterally Cardio: Regular rate and rhythm GI: No hepatomegaly, no mass, mild tenderness in the bilateral low abdomen Vascular: No leg edema Skin: Diffuse neurofibromatosis lesions     Medications: I have reviewed the patient's current medications.  Assessment/Plan: 1. 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 -restaging CT evaluation on 06/20/2011 showed no evidence of recurrent tumor.  -restaging CT on 02/15/2012 with a new ill-defined hypervascular lesion in the dome of the liver, confirmed on a MRI 02/22/2012 -CT-guided biopsy of the liver lesion on 02/29/2012 with no evidence of malignancy    -Restaging CT to 05/29/2012 confirmed a decrease in the size of the previously noted hepatic dome lesion  -CT 04/13/2013-no significant change in tiny liver lesions 2. 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 3. 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 4. Ongoing tobacco use. 5. ? Common hepatic duct calculi versus anastomotic stricture on the CT 06/20/2011 6. Cystic lesion at T8-T9 on MRI 04/30/2013  Disposition:  Ms. Nessel remains in clinical remission from the pancreas carcinoid tumor and there is no clinical evidence for progression of the gastrointestinal stromal tumors. I wonder whether the cystic spine lesion may be related to the neurofibromatosis. She is scheduled to see Dr. Vertell Limber tomorrow.  Ms. Remley will return for an office visit here in one year. She will see Dr. Barry Dienes if the nausea and vomiting becomes consistent.   Betsy Coder, MD  06/02/2013  3:21 PM

## 2013-06-03 ENCOUNTER — Telehealth: Payer: Self-pay | Admitting: Oncology

## 2013-06-03 NOTE — Telephone Encounter (Signed)
lvm for pt regarding to Feb 2016 appt....mailed pt appt sched/avs and letter

## 2013-06-04 ENCOUNTER — Telehealth: Payer: Self-pay

## 2013-06-04 NOTE — Telephone Encounter (Signed)
Medication and allergies:  Reviewed and updated  90 day supply/mail order: n/a Local pharmacy: [facility] on [address]   Immunizations due:  Tdap   A/P:  Personal, family history and past surgical hx: Updated PAP- Hx. Hysterectomy; 11/24/09 [facility]- 12/01/2004- Diverticulosis and internal hemorrhoids, otherwise negative-[facility]-Dr. Medoff [facility]- 12/02/12- negative BD- 11/23/09-low bone density Flu- 01/23/13- received at [facility] Tdap- unsure of last vaccine   To Discuss with Provider: Not at this time.

## 2013-06-05 ENCOUNTER — Ambulatory Visit (INDEPENDENT_AMBULATORY_CARE_PROVIDER_SITE_OTHER): Payer: BC Managed Care – PPO | Admitting: Family Medicine

## 2013-06-05 ENCOUNTER — Encounter: Payer: Self-pay | Admitting: Family Medicine

## 2013-06-05 VITALS — BP 122/62 | HR 87 | Temp 98.2°F | Resp 16 | Ht 62.0 in | Wt 111.5 lb

## 2013-06-05 DIAGNOSIS — Z23 Encounter for immunization: Secondary | ICD-10-CM

## 2013-06-05 DIAGNOSIS — Z0000 Encounter for general adult medical examination without abnormal findings: Secondary | ICD-10-CM

## 2013-06-05 DIAGNOSIS — M949 Disorder of cartilage, unspecified: Secondary | ICD-10-CM

## 2013-06-05 DIAGNOSIS — Z124 Encounter for screening for malignant neoplasm of cervix: Secondary | ICD-10-CM | POA: Insufficient documentation

## 2013-06-05 DIAGNOSIS — M899 Disorder of bone, unspecified: Secondary | ICD-10-CM

## 2013-06-05 LAB — CBC WITH DIFFERENTIAL/PLATELET
Basophils Absolute: 0.1 10*3/uL (ref 0.0–0.1)
Basophils Relative: 0.4 % (ref 0.0–3.0)
Eosinophils Absolute: 0.3 10*3/uL (ref 0.0–0.7)
Eosinophils Relative: 1.6 % (ref 0.0–5.0)
HCT: 45 % (ref 36.0–46.0)
Hemoglobin: 14.4 g/dL (ref 12.0–15.0)
Lymphocytes Relative: 28.8 % (ref 12.0–46.0)
Lymphs Abs: 5 10*3/uL — ABNORMAL HIGH (ref 0.7–4.0)
MCHC: 32.1 g/dL (ref 30.0–36.0)
MCV: 90.2 fl (ref 78.0–100.0)
Monocytes Absolute: 1.5 10*3/uL — ABNORMAL HIGH (ref 0.1–1.0)
Monocytes Relative: 8.9 % (ref 3.0–12.0)
Neutro Abs: 10.4 10*3/uL — ABNORMAL HIGH (ref 1.4–7.7)
Neutrophils Relative %: 60.3 % (ref 43.0–77.0)
Platelets: 416 10*3/uL — ABNORMAL HIGH (ref 150.0–400.0)
RBC: 4.99 Mil/uL (ref 3.87–5.11)
RDW: 15 % — ABNORMAL HIGH (ref 11.5–14.6)
WBC: 17.2 10*3/uL — ABNORMAL HIGH (ref 4.5–10.5)

## 2013-06-05 LAB — BASIC METABOLIC PANEL
BUN: 10 mg/dL (ref 6–23)
CO2: 22 mEq/L (ref 19–32)
Calcium: 9.9 mg/dL (ref 8.4–10.5)
Chloride: 108 mEq/L (ref 96–112)
Creatinine, Ser: 0.7 mg/dL (ref 0.4–1.2)
GFR: 85.22 mL/min (ref >60.00)
Glucose, Bld: 91 mg/dL (ref 70–99)
Potassium: 4.8 mEq/L (ref 3.5–5.1)
Sodium: 141 mEq/L (ref 135–145)

## 2013-06-05 LAB — HEPATIC FUNCTION PANEL
ALT: 21 U/L (ref 0–35)
AST: 26 U/L (ref 0–37)
Albumin: 4.3 g/dL (ref 3.5–5.2)
Alkaline Phosphatase: 82 U/L (ref 39–117)
Bilirubin, Direct: 0 mg/dL (ref 0.0–0.3)
Total Bilirubin: 0.5 mg/dL (ref 0.3–1.2)
Total Protein: 8.3 g/dL (ref 6.0–8.3)

## 2013-06-05 LAB — LDL CHOLESTEROL, DIRECT: Direct LDL: 149.2 mg/dL

## 2013-06-05 LAB — TSH: TSH: 1.58 u[IU]/mL (ref 0.35–5.50)

## 2013-06-05 LAB — LIPID PANEL
Cholesterol: 205 mg/dL — ABNORMAL HIGH (ref 0–200)
HDL: 50.8 mg/dL (ref >39.00)
Total CHOL/HDL Ratio: 4
Triglycerides: 111 mg/dL (ref 0.0–149.0)
VLDL: 22.2 mg/dL (ref 0.0–40.0)

## 2013-06-05 NOTE — Progress Notes (Signed)
   Subjective:    Patient ID: [name] [name], female    DOB: 07/13/1955, 58 y.o.   MRN: [id number]  HPI CPE- UTD on mammo [facility]) and colonoscopy.  Due for DEXA and pap.   Review of Systems Patient reports no vision/ hearing changes, adenopathy,fever, weight change,  persistant/recurrent hoarseness , swallowing issues, chest pain, palpitations, edema, persistant/recurrent cough, hemoptysis, dyspnea (rest/exertional/paroxysmal nocturnal), gastrointestinal bleeding (melena, rectal bleeding), abdominal pain, significant heartburn, bowel changes, GU symptoms (dysuria, hematuria, incontinence), Gyn symptoms (abnormal  bleeding, pain),  syncope, focal weakness, memory loss, numbness & tingling, skin/hair/nail changes, abnormal bruising or bleeding, anxiety, or depression.    Objective:   Physical Exam  General Appearance:    Alert, cooperative, no distress, appears stated age  Head:    Normocephalic, without obvious abnormality, atraumatic  Eyes:    PERRL, conjunctiva/corneas clear, EOM's intact, fundi    benign, both eyes  Ears:    Normal TM's and external ear canals, both ears  Nose:   Nares normal, septum midline, mucosa normal, no drainage    or sinus tenderness  Throat:   Lips, mucosa, and tongue normal; teeth and gums normal  Neck:   Supple, symmetrical, trachea midline, no adenopathy;    Thyroid: no enlargement/tenderness/nodules  Back:     Symmetric, no curvature, ROM normal, no CVA tenderness  Lungs:     Clear to auscultation bilaterally, respirations unlabored  Chest Wall:    No tenderness or deformity   Heart:    Regular rate and rhythm, S1 and S2 normal, no murmur, rub   or gallop  Breast Exam:    No tenderness, masses, or nipple abnormality  Abdomen:     Soft, non-tender, bowel sounds active all four quadrants,    no masses, no organomegaly  Genitalia:    External genitalia normal, uterus and cervix surgically absent, adnexa w/out mass or tenderness, mucosa pink and  moist, no lesions or discharge present  Rectal:    Normal external appearance  Extremities:   Extremities normal, atraumatic, no cyanosis or edema  Pulses:   2+ and symmetric all extremities  Skin:   Skin color, texture, turgor normal, no rashes, diffuse neurofibromas  Lymph nodes:   Cervical, supraclavicular, and axillary nodes normal  Neurologic:   CNII-XII intact, normal strength, sensation and reflexes    throughout          Assessment & Plan:

## 2013-06-05 NOTE — Patient Instructions (Signed)
Follow up in 1 year or as needed We'll notify you of your lab results and make any changes if needed Start Zyrtec daily for allergies STOP SMOKING! Call with any questions or concerns Happy Valentine's Day!!

## 2013-06-05 NOTE — Assessment & Plan Note (Signed)
Chronic problem.  Check Vit D, get DEXA.

## 2013-06-05 NOTE — Assessment & Plan Note (Signed)
Pt's PE WNL w/ exception of neurofibromas.  UTD on health maintenance w/ exception of DEXA.  Due to hysterectomy, pt no longer needs paps.  Check labs.  Anticipatory guidance provided.

## 2013-06-08 ENCOUNTER — Telehealth: Payer: Self-pay | Admitting: Family Medicine

## 2013-06-08 NOTE — Telephone Encounter (Signed)
Relevant patient education assigned to patient using Emmi. ° °

## 2013-06-09 LAB — VITAMIN D 1,25 DIHYDROXY
Vitamin D 1, 25 (OH)2 Total: 45 pg/mL (ref 18–72)
Vitamin D2 1, 25 (OH)2: <8 pg/mL
Vitamin D3 1, 25 (OH)2: 45 pg/mL

## 2013-06-24 ENCOUNTER — Ambulatory Visit
Admission: RE | Admit: 2013-06-24 | Discharge: 2013-06-24 | Disposition: A | Discharge status: Home / Self Care | Payer: BC Managed Care – PPO | Source: Ambulatory Visit | Attending: Family Medicine | Admitting: Family Medicine

## 2013-06-24 DIAGNOSIS — M899 Disorder of bone, unspecified: Secondary | ICD-10-CM

## 2013-06-24 DIAGNOSIS — M949 Disorder of cartilage, unspecified: Principal | ICD-10-CM

## 2013-07-10 ENCOUNTER — Telehealth: Payer: Self-pay

## 2013-07-10 NOTE — Telephone Encounter (Signed)
The following was reviewed with the patient:  Dr. Ronnald Ramp bone density shows low bone mass (osteopenia). Based on this, you need to take Calcium 1200 and Vit D 800 daily (2 caltrate).    Patient wanted to make sure that she should double her dose of Caltrate, and continue taking Vitamin D3 1000 units and a multivitamin daily.  Please advise.

## 2013-07-10 NOTE — Telephone Encounter (Signed)
Pt.notified

## 2013-07-10 NOTE — Telephone Encounter (Signed)
Yes- that is what I would do.  Caltrate x2, Vit D, MVI

## 2013-07-28 ENCOUNTER — Encounter: Payer: Self-pay | Admitting: Family Medicine

## 2013-09-21 ENCOUNTER — Ambulatory Visit (INDEPENDENT_AMBULATORY_CARE_PROVIDER_SITE_OTHER): Payer: BC Managed Care – PPO | Admitting: General Surgery

## 2013-09-29 ENCOUNTER — Encounter (INDEPENDENT_AMBULATORY_CARE_PROVIDER_SITE_OTHER): Payer: Self-pay | Admitting: General Surgery

## 2013-09-29 ENCOUNTER — Ambulatory Visit (INDEPENDENT_AMBULATORY_CARE_PROVIDER_SITE_OTHER): Payer: BC Managed Care – PPO | Admitting: General Surgery

## 2013-09-29 ENCOUNTER — Other Ambulatory Visit (INDEPENDENT_AMBULATORY_CARE_PROVIDER_SITE_OTHER): Payer: Self-pay | Admitting: General Surgery

## 2013-09-29 VITALS — BP 125/70 | HR 65 | Temp 98.0°F | Resp 14 | Ht 63.0 in | Wt 112.4 lb

## 2013-09-29 DIAGNOSIS — Z8509 Personal history of malignant neoplasm of other digestive organs: Secondary | ICD-10-CM

## 2013-09-29 DIAGNOSIS — C170 Malignant neoplasm of duodenum: Secondary | ICD-10-CM

## 2013-09-29 NOTE — Progress Notes (Signed)
HISTORY: Patient is a 58 year old female with neurofibromatosis who is 3 years status post Whipple procedure for a T4 N0 neuroendocrine tumor of the duodenum. 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  Several of the largest ones and these were removed for diagnosis however they could not all be removed without making her have short gut syndrome.   On her last scan, she had a spinal lesion that appeared benign, but was evaluated by neurosurgery.  They agreed that it did not need surgical intervention.    She is doing well overall.  However, she has had a slight increase in her nausea.  She has around 4-5 episodes/month that take around 2 hours to subside.  She has thrown up around 4-5 times since i saw her last.  She denies weight loss.  She is always able to eat again after the nausea resolves.  She has no blood in her stool.    PERTINENT REVIEW OF SYSTEMS: Otherwise negative.   EXAM: Head: Normocephalic and atraumatic.  Eyes:  Conjunctivae are normal. Pupils are equal, round, and reactive to light. No scleral icterus.  Neck:  Normal range of motion. Neck supple. No tracheal deviation present. No thyromegaly present.  Resp: No respiratory distress, normal effort. Abd:  Abdomen is soft, non distended and non tender. No masses are palpable.  There is no rebound and no guarding. no hernia at incision.   Neurological: Alert and oriented to person, place, and time. Coordination normal.  Skin: Skin is warm and dry. No rash noted. No diaphoretic. No erythema. No pallor. No peripheral edema.  Numerous neurofibromas diffusely over body.   Psychiatric: Normal mood and affect. Normal behavior. Judgment and thought content normal.    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  Will check upper GI with SBFT to assess mobility of stomach vs potential obstruction by one of these masses.    If she has delayed gastric emptying, would give script for reglan.  If not, would give script for phenergan or zofran.      Neuroendocrine tumor of duodenum T4N0Mx, s/p whipple 01/04/2010 Recheck in 1 year.  UGI with SBFT  Continue creon.       Milus Height, MD Surgical Oncology, [facility]  Annye Asa, MD Midge Minium, MD

## 2013-09-29 NOTE — Patient Instructions (Signed)
Get UGI with small bowel follow through.    We'll determine at that time what kind of nausea medicine you need.   Follow up in 1 year otherwise.

## 2013-09-29 NOTE — Assessment & Plan Note (Signed)
Recheck in 1 year.  UGI with SBFT  Continue creon.

## 2013-09-29 NOTE — Assessment & Plan Note (Signed)
Ok to take pepto-bismol for nausea.  Will check upper GI with SBFT to assess mobility of stomach vs potential obstruction by one of these masses.    If she has delayed gastric emptying, would give script for reglan.  If not, would give script for phenergan or zofran.

## 2013-10-02 ENCOUNTER — Other Ambulatory Visit (INDEPENDENT_AMBULATORY_CARE_PROVIDER_SITE_OTHER): Payer: Self-pay | Admitting: General Surgery

## 2013-10-02 ENCOUNTER — Ambulatory Visit
Admission: RE | Admit: 2013-10-02 | Discharge: 2013-10-02 | Disposition: A | Discharge status: Home / Self Care | Payer: BC Managed Care – PPO | Source: Ambulatory Visit | Attending: General Surgery | Admitting: General Surgery

## 2013-10-02 DIAGNOSIS — Z8509 Personal history of malignant neoplasm of other digestive organs: Secondary | ICD-10-CM

## 2013-10-06 ENCOUNTER — Other Ambulatory Visit (INDEPENDENT_AMBULATORY_CARE_PROVIDER_SITE_OTHER): Payer: Self-pay

## 2013-10-06 ENCOUNTER — Telehealth (INDEPENDENT_AMBULATORY_CARE_PROVIDER_SITE_OTHER): Payer: Self-pay

## 2013-10-06 DIAGNOSIS — R112 Nausea with vomiting, unspecified: Secondary | ICD-10-CM

## 2013-10-06 MED ORDER — ONDANSETRON HCL 4 MG PO TABS: 4.0000 mg | ORAL_TABLET | Freq: Three times a day (TID) | ORAL | Status: DC | PRN

## 2013-10-06 NOTE — Telephone Encounter (Signed)
Rx for Zofran 4mg  tablets with 1 refill sent to [facility].  Pt is aware.

## 2014-01-21 ENCOUNTER — Other Ambulatory Visit: Payer: Self-pay

## 2014-01-21 ENCOUNTER — Encounter: Payer: Self-pay | Admitting: Family Medicine

## 2014-01-21 DIAGNOSIS — Z1239 Encounter for other screening for malignant neoplasm of breast: Secondary | ICD-10-CM

## 2014-01-28 ENCOUNTER — Ambulatory Visit
Admission: RE | Admit: 2014-01-28 | Discharge: 2014-01-28 | Disposition: A | Discharge status: Home / Self Care | Payer: BC Managed Care – PPO | Source: Ambulatory Visit

## 2014-01-28 DIAGNOSIS — Z1239 Encounter for other screening for malignant neoplasm of breast: Secondary | ICD-10-CM

## 2014-03-11 ENCOUNTER — Encounter: Payer: Self-pay | Admitting: Family Medicine

## 2014-03-15 ENCOUNTER — Encounter: Payer: Self-pay | Admitting: Family Medicine

## 2014-03-15 ENCOUNTER — Ambulatory Visit (INDEPENDENT_AMBULATORY_CARE_PROVIDER_SITE_OTHER): Payer: BC Managed Care – PPO | Admitting: Family Medicine

## 2014-03-15 VITALS — BP 140/94 | HR 85 | Temp 97.9°F | Resp 16 | Wt 105.5 lb

## 2014-03-15 DIAGNOSIS — M6248 Contracture of muscle, other site: Secondary | ICD-10-CM

## 2014-03-15 DIAGNOSIS — M62838 Other muscle spasm: Secondary | ICD-10-CM | POA: Insufficient documentation

## 2014-03-15 DIAGNOSIS — G44209 Tension-type headache, unspecified, not intractable: Secondary | ICD-10-CM

## 2014-03-15 MED ORDER — KETOROLAC TROMETHAMINE 60 MG/2ML IM SOLN
60.0000 mg | Freq: Once | INTRAMUSCULAR | Status: AC
  Administered 2014-03-15: 60 mg via INTRAMUSCULAR

## 2014-03-15 MED ORDER — CYCLOBENZAPRINE HCL 10 MG PO TABS: 10.0000 mg | ORAL_TABLET | Freq: Three times a day (TID) | ORAL | Status: DC | PRN

## 2014-03-15 MED ORDER — MELOXICAM 15 MG PO TABS: 15.0000 mg | ORAL_TABLET | Freq: Every day | ORAL | Status: DC

## 2014-03-15 NOTE — Progress Notes (Signed)
   Subjective:    Patient ID: [name] [name], female    DOB: [birth year], 58 y.o.   MRN: [id number]  HPI HA- sxs started on Thursday and have not improved since.  Has taken tylenol, Aleve w/o improvement.  Pain will radiate down neck and into shoulders, extending into mid back.  Pt reports increased stress recently- lost job.  Pain worsens w/ activity.  Denies recent heavy lifting.  No focal weakness/numbness, no dizziness, no blurry/double vision.   Review of Systems For ROS see HPI     Objective:   Physical Exam  Constitutional: She is oriented to person, place, and time. She appears well-developed and well-nourished. No distress.  HENT:  Head: Normocephalic and atraumatic.  TMs WNL No TTP over sinuses Minimal nasal congestion  Eyes: Conjunctivae and EOM are normal. Pupils are equal, round, and reactive to light.  Neck: Normal range of motion. Neck supple.  Bilateral trap spasm w/ TTP to palpation  Cardiovascular: Normal rate, regular rhythm, normal heart sounds and intact distal pulses.   Pulmonary/Chest: Effort normal and breath sounds normal. No respiratory distress. She has no wheezes. She has no rales.  Lymphadenopathy:    She has no cervical adenopathy.  Neurological: She is alert and oriented to person, place, and time. She has normal reflexes. No cranial nerve deficit. Coordination normal.  Psychiatric: She has a normal mood and affect. Her behavior is normal. Judgment and thought content normal.  Vitals reviewed.         Assessment & Plan:

## 2014-03-15 NOTE — Progress Notes (Signed)
Pre visit review using our clinic review tool, if applicable. No additional management support is needed unless otherwise documented below in the visit note. 

## 2014-03-15 NOTE — Assessment & Plan Note (Signed)
New.  Pt's HA location and radiation of pain is consistent w/ trap spasm causing tension HA.  No red flags on hx or PE to suspect intracranial process.  toradol injxn given in office for pain relief.  Start scheduled NSAIDs and flexeril.  Reviewed supportive care and red flags that should prompt return.  Pt expressed understanding and is in agreement w/ plan.

## 2014-03-15 NOTE — Patient Instructions (Signed)
Follow up as needed Start the Meloxicam daily tomorrow w/ breakfast (anti-inflammatory) Use the cyclobenzaprine as needed for muscle spasm- will cause drowsiness Heating pad for pain and spasm relief Do some gentle stretching of neck and shoulders to avoid stiffness If pain is changing, worsening, or not improving- please call! Hang in there! Happy Holidays!!

## 2014-03-16 ENCOUNTER — Telehealth: Payer: Self-pay | Admitting: General Practice

## 2014-03-16 NOTE — Telephone Encounter (Signed)
Letter placed at front desk until pt returns call.

## 2014-03-16 NOTE — Telephone Encounter (Signed)
Called pt to verify where she would like the letter Dr.Tabori wrote for her to be placed. Either at front desk or mailed.

## 2014-05-19 ENCOUNTER — Encounter: Payer: Self-pay | Admitting: Family Medicine

## 2014-05-19 DIAGNOSIS — Z8509 Personal history of malignant neoplasm of other digestive organs: Secondary | ICD-10-CM

## 2014-05-19 DIAGNOSIS — C170 Malignant neoplasm of duodenum: Secondary | ICD-10-CM

## 2014-05-19 NOTE — Telephone Encounter (Signed)
Referrals placed 

## 2014-06-01 ENCOUNTER — Telehealth: Payer: Self-pay | Admitting: Oncology

## 2014-06-01 NOTE — Telephone Encounter (Signed)
S/w pt confirming r/s apt due to out of work, r/s and confirmed.... KJ

## 2014-06-02 ENCOUNTER — Other Ambulatory Visit: Payer: Self-pay | Admitting: Ophthalmology

## 2014-06-02 ENCOUNTER — Encounter: Payer: Self-pay | Admitting: Family Medicine

## 2014-06-02 DIAGNOSIS — Q8500 Neurofibromatosis, unspecified: Secondary | ICD-10-CM

## 2014-06-02 NOTE — Telephone Encounter (Signed)
Referral placed.

## 2014-06-03 ENCOUNTER — Ambulatory Visit: Payer: BC Managed Care – PPO | Admitting: Oncology

## 2014-06-30 NOTE — Addendum Note (Signed)
Addended by: Sherlynn Stalls on: 06/30/2014 05:36 PM   Modules accepted: Orders

## 2014-07-08 ENCOUNTER — Ambulatory Visit (INDEPENDENT_AMBULATORY_CARE_PROVIDER_SITE_OTHER): Payer: 59 | Admitting: Family Medicine

## 2014-07-08 ENCOUNTER — Encounter: Payer: Self-pay | Admitting: Family Medicine

## 2014-07-08 VITALS — BP 136/88 | HR 84 | Temp 98.1°F | Resp 17 | Ht 62.5 in | Wt 112.4 lb

## 2014-07-08 DIAGNOSIS — Z0000 Encounter for general adult medical examination without abnormal findings: Secondary | ICD-10-CM

## 2014-07-08 LAB — CBC WITH DIFFERENTIAL/PLATELET
Basophils Absolute: 0.1 10*3/uL (ref 0.0–0.1)
Basophils Relative: 0.7 % (ref 0.0–3.0)
Eosinophils Absolute: 0.1 10*3/uL (ref 0.0–0.7)
Eosinophils Relative: 0.6 % (ref 0.0–5.0)
HCT: 43.3 % (ref 36.0–46.0)
Hemoglobin: 14.6 g/dL (ref 12.0–15.0)
Lymphocytes Relative: 33.9 % (ref 12.0–46.0)
Lymphs Abs: 4 10*3/uL (ref 0.7–4.0)
MCHC: 33.7 g/dL (ref 30.0–36.0)
MCV: 85.6 fl (ref 78.0–100.0)
Monocytes Absolute: 0.9 10*3/uL (ref 0.1–1.0)
Monocytes Relative: 7.7 % (ref 3.0–12.0)
Neutro Abs: 6.7 10*3/uL (ref 1.4–7.7)
Neutrophils Relative %: 57.1 % (ref 43.0–77.0)
Platelets: 434 10*3/uL — ABNORMAL HIGH (ref 150.0–400.0)
RBC: 5.05 Mil/uL (ref 3.87–5.11)
RDW: 14.8 % (ref 11.5–15.5)
WBC: 11.8 10*3/uL — ABNORMAL HIGH (ref 4.0–10.5)

## 2014-07-08 LAB — BASIC METABOLIC PANEL
BUN: 8 mg/dL (ref 6–23)
CO2: 26 mEq/L (ref 19–32)
Calcium: 9.7 mg/dL (ref 8.4–10.5)
Chloride: 104 mEq/L (ref 96–112)
Creatinine, Ser: 0.61 mg/dL (ref 0.40–1.20)
GFR: 106.11 mL/min (ref >60.00)
Glucose, Bld: 83 mg/dL (ref 70–99)
Potassium: 4 mEq/L (ref 3.5–5.1)
Sodium: 138 mEq/L (ref 135–145)

## 2014-07-08 LAB — LIPID PANEL
Cholesterol: 181 mg/dL (ref 0–200)
HDL: 52.6 mg/dL (ref >39.00)
LDL Cholesterol: 108 mg/dL — ABNORMAL HIGH (ref 0–99)
NonHDL: 128.4
Total CHOL/HDL Ratio: 3
Triglycerides: 101 mg/dL (ref 0.0–149.0)
VLDL: 20.2 mg/dL (ref 0.0–40.0)

## 2014-07-08 LAB — HEPATIC FUNCTION PANEL
ALT: 21 U/L (ref 0–35)
AST: 24 U/L (ref 0–37)
Albumin: 4.2 g/dL (ref 3.5–5.2)
Alkaline Phosphatase: 81 U/L (ref 39–117)
Bilirubin, Direct: 0.1 mg/dL (ref 0.0–0.3)
Total Bilirubin: 0.3 mg/dL (ref 0.2–1.2)
Total Protein: 8 g/dL (ref 6.0–8.3)

## 2014-07-08 LAB — TSH: TSH: 3.17 u[IU]/mL (ref 0.35–4.50)

## 2014-07-08 LAB — VITAMIN D 25 HYDROXY (VIT D DEFICIENCY, FRACTURES): VITD: 45.08 ng/mL (ref 30.00–100.00)

## 2014-07-08 NOTE — Progress Notes (Signed)
Pre visit review using our clinic review tool, if applicable. No additional management support is needed unless otherwise documented below in the visit note. 

## 2014-07-08 NOTE — Assessment & Plan Note (Signed)
Pt's PE WNL w/ exception of known NF-1.  UTD on mammo and DEXA, due for colonoscopy (referral entered).  Check labs.  Anticipatory guidance provided.

## 2014-07-08 NOTE — Patient Instructions (Signed)
Follow up in 1 year or as needed Call or MyChart when you need your mammo order We'll notify you of your lab results and make any changes if needed Keep up the good work!  You look great! Quit smoking! Call with any questions or concerns Happy Spring!!!

## 2014-07-08 NOTE — Progress Notes (Signed)
   Subjective:    Patient ID: [name] [name], female    DOB: November 10, 1955, 59 y.o.   MRN: [id number]  HPI CPE- due for colonoscopy (Dr Ardis Hughs).  UTD on mammo and DEXA.  No need for paps due to hysterectomy.     Review of Systems Patient reports no vision/ hearing changes, adenopathy,fever, weight change,  persistant/recurrent hoarseness , swallowing issues, chest pain, palpitations, edema, persistant/recurrent cough, hemoptysis, dyspnea (rest/exertional/paroxysmal nocturnal), gastrointestinal bleeding (melena, rectal bleeding), abdominal pain, significant heartburn, bowel changes, GU symptoms (dysuria, hematuria, incontinence), Gyn symptoms (abnormal  bleeding, pain),  syncope, focal weakness, memory loss, numbness & tingling, skin/hair/nail changes, abnormal bruising or bleeding, anxiety, or depression.     Objective:   Physical Exam General Appearance:    Alert, cooperative, no distress, appears stated age  Head:    Normocephalic, without obvious abnormality, atraumatic  Eyes:    PERRL, conjunctiva/corneas clear, EOM's intact, fundi    benign, both eyes  Ears:    Normal TM's and external ear canals, both ears  Nose:   Nares normal, septum midline, mucosa normal, no drainage    or sinus tenderness  Throat:   Lips, mucosa, and tongue normal; teeth and gums normal  Neck:   Supple, symmetrical, trachea midline, no adenopathy;    Thyroid: no enlargement/tenderness/nodules  Back:     Symmetric, no curvature, ROM normal, no CVA tenderness  Lungs:     Clear to auscultation bilaterally, respirations unlabored  Chest Wall:    No tenderness or deformity   Heart:    Regular rate and rhythm, S1 and S2 normal, no murmur, rub   or gallop  Breast Exam:    Deferred to mammo  Abdomen:     Soft, non-tender, bowel sounds active all four quadrants,    no masses, no organomegaly  Genitalia:    Deferred at pt's request  Rectal:    Extremities:   Extremities normal, atraumatic, no cyanosis or edema  Pulses:    2+ and symmetric all extremities  Skin:   Skin color, texture, turgor normal, many cutaneous neurofibromas  Lymph nodes:   Cervical, supraclavicular, and axillary nodes normal  Neurologic:   CNII-XII intact, normal strength, sensation and reflexes    throughout          Assessment & Plan:

## 2014-07-16 ENCOUNTER — Ambulatory Visit (HOSPITAL_BASED_OUTPATIENT_CLINIC_OR_DEPARTMENT_OTHER): Payer: 59 | Admitting: Oncology

## 2014-07-16 VITALS — BP 143/76 | HR 73 | Temp 98.1°F | Resp 18 | Ht 62.5 in | Wt 112.7 lb

## 2014-07-16 DIAGNOSIS — Z85030 Personal history of malignant carcinoid tumor of large intestine: Secondary | ICD-10-CM

## 2014-07-16 DIAGNOSIS — Z720 Tobacco use: Secondary | ICD-10-CM

## 2014-07-16 DIAGNOSIS — M899 Disorder of bone, unspecified: Secondary | ICD-10-CM | POA: Diagnosis not present

## 2014-07-16 DIAGNOSIS — C170 Malignant neoplasm of duodenum: Secondary | ICD-10-CM

## 2014-07-16 NOTE — Progress Notes (Signed)
  [facility] OFFICE PROGRESS NOTE   Diagnosis: Carcinoid tumor, gastrointestinal stromal tumors  INTERVAL HISTORY:   [name] [name] returns as scheduled. She feels well. Good appetite. She discontinued pancreatic enzyme replacement and does not have diarrhea. She reports hot flashes in the early evening, but no night sweats. She saw Dr. Vertell Limber to evaluate the spine lesion and reports no further imaging was recommended. She continues smoking.  Objective:  Vital signs in last 24 hours:  Blood pressure 143/76, pulse 73, temperature 98.1 F (36.7 C), temperature source Oral, resp. rate 18, height 5' 2.5" (1.588 m), weight 112 lb 11.2 oz (51.12 kg), SpO2 99 %.    HEENT: Neck without mass Lymphatics: No cervical, supra-clavicular, axillary, or inguinal nodes Resp: Distant breath sounds, no respiratory distress Cardio: Regular rate and rhythm GI: No hepatosplenomegaly, nontender, no mass Vascular: No leg edema  Skin: Multiple neurofibromas   Portacath/PICC-without erythema  Medications: I have reviewed the patient's current medications.  Assessment/Plan: 1. 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 -restaging CT evaluation on 06/20/2011 showed no evidence of recurrent tumor.  -restaging CT on 02/15/2012 with a new ill-defined hypervascular lesion in the dome of the liver, confirmed on a MRI 02/22/2012 -CT-guided biopsy of the liver lesion on 02/29/2012 with no evidence of malignancy  -Restaging CT to 05/29/2012 confirmed a decrease in the size of the previously noted hepatic dome lesion  -CT 04/13/2013-no significant change in tiny liver lesions 2. 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 3. Von Recklinghausen disease/neurofibromatosis with diffuse cutaneous  nodules. 4. Ongoing tobacco use. 5. ? Common hepatic duct calculi versus anastomotic stricture on the CT 06/20/2011 6. Cystic lesion at T8-T9 on MRI 04/30/2013-evaluated by Dr. Vertell Limber   Disposition:  Ms. Strom remains in clinical remission from the carcinoid tumor. There is no clinical evidence for recurrence of the low-grade gastrointestinal stromal tumors. She would like to continue clinical follow-up with Dr. Birdie Riddle. She also plans to schedule an appointment with Dr. Barry Dienes. She was discharged from the Oncology clinic today. I am available to see her in the future as needed. I recommended she discontinue smoking.  Betsy Coder, MD  07/16/2014  11:54 AM

## 2014-08-07 NOTE — Pre-Procedure Instructions (Signed)
TRESSA MALDONADO  08/07/2014   Your procedure is scheduled on:  April 25  Report to [facility] Admitting at 10:45 AM.  Call this number if you have problems the morning of surgery: [phone]   Remember:   Do not eat food or drink liquids after midnight.   Take these medicines the morning of surgery with A SIP OF WATER: Zyrtec   STOP Caltrate, Vitamin D, Multiple Vitamins   STOP/ Do not take Aspirin, Aleve, Naproxen, Advil, Ibuprofen, Motrin, Vitamins, Herbs, or Supplements starting today   Do not wear jewelry, make-up or nail polish.  Do not wear lotions, powders, or perfumes. You may wear deodorant.  Do not shave 48 hours prior to surgery. Men may shave face and neck.  Do not bring valuables to the hospital.  [facility] is not responsible for any belongings or valuables.               Contacts, dentures or bridgework may not be worn into surgery.  Leave suitcase in the car. After surgery it may be brought to your room.  For patients admitted to the hospital, discharge time is determined by your treatment team.               Patients discharged the day of surgery will not be allowed to drive home.  Name and phone number of your driver: Family/ Friend  Special Instructions: [facility] - Preparing for Surgery  Before surgery, you can play an important role.  Because skin is not sterile, your skin needs to be as free of germs as possible.  You can reduce the number of germs on you skin by washing with CHG (chlorahexidine gluconate) soap before surgery.  CHG is an antiseptic cleaner which kills germs and bonds with the skin to continue killing germs even after washing.  Please DO NOT use if you have an allergy to CHG or antibacterial soaps.  If your skin becomes reddened/irritated stop using the CHG and inform your nurse when you arrive at Short Stay.  Do not shave (including legs and underarms) for at least 48 hours prior to the first CHG shower.  You may shave your  face.  Please follow these instructions carefully:   1.  Shower with CHG Soap the night before surgery and the morning of Surgery.  2.  If you choose to wash your hair, wash your hair first as usual with your normal shampoo.  3.  After you shampoo, rinse your hair and body thoroughly to remove the shampoo.  4.  Use CHG as you would any other liquid soap.  You can apply CHG directly to the skin and wash gently with scrungie or a clean washcloth.  5.  Apply the CHG Soap to your body ONLY FROM THE NECK DOWN.  Do not use on open wounds or open sores.  Avoid contact with your eyes, ears, mouth and genitals (private parts).  Wash genitals (private parts) with your normal soap.  6.  Wash thoroughly, paying special attention to the area where your surgery will be performed.  7.  Thoroughly rinse your body with warm water from the neck down.  8.  DO NOT shower/wash with your normal soap after using and rinsing off the CHG Soap.  9.  Pat yourself dry with a clean towel.            10.  Wear clean pajamas.            11.  Place clean sheets on your bed the night of your first shower and do not sleep with pets.  Day of Surgery  Do not apply any lotions the morning of surgery.  Please wear clean clothes to the hospital/surgery center.     Please read over the following fact sheets that you were given: Pain Booklet, Coughing and Deep Breathing and Surgical Site Infection Prevention

## 2014-08-09 ENCOUNTER — Encounter (HOSPITAL_COMMUNITY): Payer: Self-pay

## 2014-08-09 ENCOUNTER — Other Ambulatory Visit (HOSPITAL_COMMUNITY): Payer: 59

## 2014-08-09 ENCOUNTER — Other Ambulatory Visit: Payer: Self-pay | Admitting: Ophthalmology

## 2014-08-09 ENCOUNTER — Encounter (HOSPITAL_COMMUNITY)
Admission: RE | Admit: 2014-08-09 | Discharge: 2014-08-09 | Disposition: A | Discharge status: Home / Self Care | Payer: 59 | Source: Ambulatory Visit | Attending: Ophthalmology | Admitting: Ophthalmology

## 2014-08-09 DIAGNOSIS — Z01818 Encounter for other preprocedural examination: Secondary | ICD-10-CM | POA: Insufficient documentation

## 2014-08-09 HISTORY — DX: Pneumothorax, unspecified: J93.9

## 2014-08-09 HISTORY — DX: Family history of other specified conditions: Z84.89

## 2014-08-09 LAB — CBC
HCT: 42.1 % (ref 36.0–46.0)
Hemoglobin: 13.9 g/dL (ref 12.0–15.0)
MCH: 29 pg (ref 26.0–34.0)
MCHC: 33 g/dL (ref 30.0–36.0)
MCV: 87.7 fL (ref 78.0–100.0)
Platelets: 374 10*3/uL (ref 150–400)
RBC: 4.8 MIL/uL (ref 3.87–5.11)
RDW: 14.5 % (ref 11.5–15.5)
WBC: 12.8 10*3/uL — ABNORMAL HIGH (ref 4.0–10.5)

## 2014-08-09 NOTE — Progress Notes (Signed)
Patient denies CP, Shob, cardiac test, or cardiologist. PCP Dr. Birdie Riddle most recent note on chart

## 2014-08-15 MED ORDER — PHENYLEPHRINE HCL 2.5 % OP SOLN
1.0000 [drp] | OPHTHALMIC | Status: AC
  Administered 2014-08-16 (×3): 1 [drp] via OPHTHALMIC
  Filled 2014-08-15: qty 2

## 2014-08-15 MED ORDER — ATROPINE SULFATE 1 % OP SOLN
1.0000 [drp] | OPHTHALMIC | Status: AC
  Administered 2014-08-16 (×3): 1 [drp] via OPHTHALMIC
  Filled 2014-08-15: qty 5

## 2014-08-16 ENCOUNTER — Ambulatory Visit (HOSPITAL_COMMUNITY): Payer: 59 | Admitting: Certified Registered"

## 2014-08-16 ENCOUNTER — Encounter (HOSPITAL_COMMUNITY)
Admission: RE | Disposition: A | Discharge status: Home / Self Care | Payer: Self-pay | Source: Ambulatory Visit | Attending: Ophthalmology

## 2014-08-16 ENCOUNTER — Ambulatory Visit (HOSPITAL_COMMUNITY)
Admission: RE | Admit: 2014-08-16 | Discharge: 2014-08-16 | Disposition: A | Discharge status: Home / Self Care | Payer: 59 | Source: Ambulatory Visit | Attending: Ophthalmology | Admitting: Ophthalmology

## 2014-08-16 ENCOUNTER — Encounter (HOSPITAL_COMMUNITY): Payer: Self-pay | Admitting: *Deleted

## 2014-08-16 DIAGNOSIS — F17210 Nicotine dependence, cigarettes, uncomplicated: Secondary | ICD-10-CM | POA: Insufficient documentation

## 2014-08-16 DIAGNOSIS — Z79899 Other long term (current) drug therapy: Secondary | ICD-10-CM | POA: Diagnosis not present

## 2014-08-16 DIAGNOSIS — H2640 Unspecified secondary cataract: Secondary | ICD-10-CM | POA: Insufficient documentation

## 2014-08-16 DIAGNOSIS — H35372 Puckering of macula, left eye: Secondary | ICD-10-CM

## 2014-08-16 HISTORY — PX: MEMBRANE PEEL: SHX5967

## 2014-08-16 HISTORY — PX: PARS PLANA VITRECTOMY: SHX2166

## 2014-08-16 SURGERY — PARS PLANA VITRECTOMY WITH 25 GAUGE
Anesthesia: General | Site: Eye | Laterality: Left

## 2014-08-16 MED ORDER — BSS IO SOLN
INTRAOCULAR | Status: AC
  Filled 2014-08-16: qty 15

## 2014-08-16 MED ORDER — LACTATED RINGERS IV SOLN: INTRAVENOUS | Status: DC

## 2014-08-16 MED ORDER — MIDAZOLAM HCL 5 MG/5ML IJ SOLN
INTRAMUSCULAR | Status: DC | PRN
  Administered 2014-08-16 (×2): 1 mg via INTRAVENOUS

## 2014-08-16 MED ORDER — HYALURONIDASE HUMAN 150 UNIT/ML IJ SOLN
INTRAMUSCULAR | Status: AC
  Filled 2014-08-16: qty 1

## 2014-08-16 MED ORDER — TOBRAMYCIN-DEXAMETHASONE 0.3-0.1 % OP OINT
TOPICAL_OINTMENT | OPHTHALMIC | Status: AC
  Filled 2014-08-16: qty 3.5

## 2014-08-16 MED ORDER — HYPROMELLOSE (GONIOSCOPIC) 2.5 % OP SOLN
OPHTHALMIC | Status: AC
  Filled 2014-08-16: qty 15

## 2014-08-16 MED ORDER — EPINEPHRINE HCL 1 MG/ML IJ SOLN
INTRAMUSCULAR | Status: DC | PRN
  Administered 2014-08-16: 13:00:00

## 2014-08-16 MED ORDER — LIDOCAINE HCL (CARDIAC) 20 MG/ML IV SOLN
INTRAVENOUS | Status: AC
  Filled 2014-08-16: qty 5

## 2014-08-16 MED ORDER — LIDOCAINE HCL 2 % IJ SOLN
INTRAMUSCULAR | Status: DC | PRN
  Administered 2014-08-16: 5 mL via RETROBULBAR

## 2014-08-16 MED ORDER — DEXAMETHASONE SODIUM PHOSPHATE 10 MG/ML IJ SOLN
INTRAMUSCULAR | Status: AC
  Filled 2014-08-16: qty 1

## 2014-08-16 MED ORDER — ROCURONIUM BROMIDE 50 MG/5ML IV SOLN
INTRAVENOUS | Status: AC
  Filled 2014-08-16: qty 1

## 2014-08-16 MED ORDER — PROPOFOL 10 MG/ML IV BOLUS
INTRAVENOUS | Status: DC | PRN
  Administered 2014-08-16: 180 mg via INTRAVENOUS
  Administered 2014-08-16: 20 mg via INTRAVENOUS

## 2014-08-16 MED ORDER — ACETAMINOPHEN 325 MG PO TABS
650.0000 mg | ORAL_TABLET | Freq: Once | ORAL | Status: AC
  Administered 2014-08-16: 650 mg via ORAL

## 2014-08-16 MED ORDER — ONDANSETRON HCL 4 MG/2ML IJ SOLN
INTRAMUSCULAR | Status: AC
  Filled 2014-08-16: qty 2

## 2014-08-16 MED ORDER — FENTANYL CITRATE (PF) 250 MCG/5ML IJ SOLN
INTRAMUSCULAR | Status: AC
  Filled 2014-08-16: qty 5

## 2014-08-16 MED ORDER — SODIUM CHLORIDE 0.9 % IV SOLN
INTRAVENOUS | Status: DC
  Administered 2014-08-16 (×2): via INTRAVENOUS

## 2014-08-16 MED ORDER — ATROPINE SULFATE 1 % OP SOLN
OPHTHALMIC | Status: AC
  Filled 2014-08-16: qty 5

## 2014-08-16 MED ORDER — ACETAMINOPHEN 325 MG PO TABS
ORAL_TABLET | ORAL | Status: AC
  Filled 2014-08-16: qty 2

## 2014-08-16 MED ORDER — BSS PLUS IO SOLN
INTRAOCULAR | Status: AC
  Filled 2014-08-16: qty 500

## 2014-08-16 MED ORDER — BSS IO SOLN
INTRAOCULAR | Status: DC | PRN
  Administered 2014-08-16: 15 mL via INTRAOCULAR

## 2014-08-16 MED ORDER — PHENYLEPHRINE HCL 10 MG/ML IJ SOLN
INTRAMUSCULAR | Status: DC | PRN
  Administered 2014-08-16 (×3): 40 ug via INTRAVENOUS

## 2014-08-16 MED ORDER — STERILE WATER FOR INJECTION IJ SOLN
INTRAMUSCULAR | Status: AC
  Administered 2014-08-16: .75 mL
  Filled 2014-08-16: qty 0.15

## 2014-08-16 MED ORDER — 0.9 % SODIUM CHLORIDE (POUR BTL) OPTIME
TOPICAL | Status: DC | PRN
  Administered 2014-08-16: 1000 mL

## 2014-08-16 MED ORDER — LIDOCAINE HCL (CARDIAC) 20 MG/ML IV SOLN
INTRAVENOUS | Status: DC | PRN
  Administered 2014-08-16: 100 mg via INTRAVENOUS

## 2014-08-16 MED ORDER — MIDAZOLAM HCL 2 MG/2ML IJ SOLN
INTRAMUSCULAR | Status: AC
  Filled 2014-08-16: qty 2

## 2014-08-16 MED ORDER — EPINEPHRINE HCL 1 MG/ML IJ SOLN
INTRAMUSCULAR | Status: AC
  Filled 2014-08-16: qty 1

## 2014-08-16 MED ORDER — ONDANSETRON HCL 4 MG/2ML IJ SOLN
INTRAMUSCULAR | Status: DC | PRN
  Administered 2014-08-16: 4 mg via INTRAVENOUS

## 2014-08-16 MED ORDER — LIDOCAINE HCL 2 % IJ SOLN
INTRAMUSCULAR | Status: AC
  Filled 2014-08-16: qty 20

## 2014-08-16 MED ORDER — BUPIVACAINE HCL (PF) 0.75 % IJ SOLN
INTRAMUSCULAR | Status: AC
  Filled 2014-08-16: qty 10

## 2014-08-16 MED ORDER — GLYCOPYRROLATE 0.2 MG/ML IJ SOLN
INTRAMUSCULAR | Status: AC
  Filled 2014-08-16: qty 2

## 2014-08-16 MED ORDER — ROCURONIUM BROMIDE 100 MG/10ML IV SOLN
INTRAVENOUS | Status: DC | PRN
  Administered 2014-08-16: 25 mg via INTRAVENOUS
  Administered 2014-08-16 (×2): 10 mg via INTRAVENOUS

## 2014-08-16 MED ORDER — DEXAMETHASONE SODIUM PHOSPHATE 10 MG/ML IJ SOLN
INTRAMUSCULAR | Status: DC | PRN
  Administered 2014-08-16: 10 mg

## 2014-08-16 MED ORDER — TOBRAMYCIN-DEXAMETHASONE 0.3-0.1 % OP OINT
TOPICAL_OINTMENT | OPHTHALMIC | Status: DC | PRN
  Administered 2014-08-16: 1 via OPHTHALMIC

## 2014-08-16 MED ORDER — NEOSTIGMINE METHYLSULFATE 10 MG/10ML IV SOLN
INTRAVENOUS | Status: DC | PRN
  Administered 2014-08-16: 3 mg via INTRAVENOUS

## 2014-08-16 MED ORDER — PROPOFOL 10 MG/ML IV BOLUS
INTRAVENOUS | Status: AC
  Filled 2014-08-16: qty 20

## 2014-08-16 MED ORDER — CEFAZOLIN SUBCONJUNCTIVAL INJECTION 100 MG/0.5 ML
100.0000 mg | INJECTION | SUBCONJUNCTIVAL | Status: DC
  Filled 2014-08-16: qty 5

## 2014-08-16 MED ORDER — GLYCOPYRROLATE 0.2 MG/ML IJ SOLN
INTRAMUSCULAR | Status: DC | PRN
  Administered 2014-08-16: 0.4 mg via INTRAVENOUS

## 2014-08-16 MED ORDER — HYPROMELLOSE (GONIOSCOPIC) 2.5 % OP SOLN
OPHTHALMIC | Status: DC | PRN
  Administered 2014-08-16: 10 [drp] via OPHTHALMIC

## 2014-08-16 MED ORDER — ATROPINE SULFATE 1 % OP SOLN
OPHTHALMIC | Status: DC | PRN
  Administered 2014-08-16: 2 [drp] via OPHTHALMIC

## 2014-08-16 MED ORDER — ARTIFICIAL TEARS OP OINT
TOPICAL_OINTMENT | OPHTHALMIC | Status: AC
  Filled 2014-08-16: qty 3.5

## 2014-08-16 SURGICAL SUPPLY — 67 items
ACCESSORY FRAGMATOME (MISCELLANEOUS) IMPLANT
APL SRG 3 HI ABS STRL LF PLS (MISCELLANEOUS)
APPLICATOR DR MATTHEWS STRL (MISCELLANEOUS) IMPLANT
BLADE MINI 60D BLUE (BLADE) IMPLANT
BLADE MVR KNIFE 20G (BLADE) IMPLANT
CANNULA ANT CHAM MAIN (OPHTHALMIC RELATED) IMPLANT
CANNULA DUAL BORE 23G (CANNULA) IMPLANT
CANNULA VLV SOFT TIP 25G (OPHTHALMIC) ×1 IMPLANT
CANNULA VLV SOFT TIP 25GA (OPHTHALMIC) ×3 IMPLANT
CAUTERY EYE LOW TEMP 1300F FIN (OPHTHALMIC RELATED) IMPLANT
CLOSURE STERI-STRIP 1/2X4 (GAUZE/BANDAGES/DRESSINGS)
CLSR STERI-STRIP ANTIMIC 1/2X4 (GAUZE/BANDAGES/DRESSINGS) IMPLANT
CORDS BIPOLAR (ELECTRODE) IMPLANT
COVER MAYO STAND STRL (DRAPES) ×1 IMPLANT
DRAPE INCISE 51X51 W/FILM STRL (DRAPES) ×3 IMPLANT
DRAPE PROXIMA HALF (DRAPES) ×3 IMPLANT
DRAPE RETRACTOR (MISCELLANEOUS) ×3 IMPLANT
ERASER HMR WETFIELD 23G BP (MISCELLANEOUS) IMPLANT
FILTER BLUE MILLIPORE (MISCELLANEOUS) IMPLANT
FORCEPS ECKARDT ILM 25G SERR (OPHTHALMIC RELATED) IMPLANT
FORCEPS GRIESHABER ILM 25G A (INSTRUMENTS) ×2 IMPLANT
GAS AUTO FILL CONSTEL (OPHTHALMIC)
GAS AUTO FILL CONSTELLATION (OPHTHALMIC) IMPLANT
GLOVE BIO SURGEON STRL SZ7.5 (GLOVE) ×3 IMPLANT
GLOVE SURG SS PI 7.0 STRL IVOR (GLOVE) ×2 IMPLANT
GOWN STRL REUS W/ TWL LRG LVL3 (GOWN DISPOSABLE) ×2 IMPLANT
GOWN STRL REUS W/TWL LRG LVL3 (GOWN DISPOSABLE) ×6
HANDLE PNEUMATIC FOR CONSTEL (OPHTHALMIC) ×2 IMPLANT
KIT BASIN OR (CUSTOM PROCEDURE TRAY) ×3 IMPLANT
KIT PERFLUORON PROCEDURE 5ML (MISCELLANEOUS) IMPLANT
KIT ROOM TURNOVER OR (KITS) ×3 IMPLANT
LENS BIOM SUPER VIEW SET DISP (OPHTHALMIC RELATED) ×3 IMPLANT
MICROPICK 25G (MISCELLANEOUS)
NDL 18GX1X1/2 (RX/OR ONLY) (NEEDLE) ×1 IMPLANT
NDL 25GX 5/8IN NON SAFETY (NEEDLE) ×2 IMPLANT
NDL FILTER BLUNT 18X1 1/2 (NEEDLE) IMPLANT
NDL HYPO 25GX1X1/2 BEV (NEEDLE) IMPLANT
NDL HYPO 30X.5 LL (NEEDLE) ×2 IMPLANT
NDL RETROBULBAR 25GX1.5 (NEEDLE) ×1 IMPLANT
NEEDLE 18GX1X1/2 (RX/OR ONLY) (NEEDLE) ×3 IMPLANT
NEEDLE 25GX 5/8IN NON SAFETY (NEEDLE) ×3 IMPLANT
NEEDLE FILTER BLUNT 18X 1/2SAF (NEEDLE) ×2
NEEDLE FILTER BLUNT 18X1 1/2 (NEEDLE) ×1 IMPLANT
NEEDLE HYPO 25GX1X1/2 BEV (NEEDLE) ×3 IMPLANT
NEEDLE HYPO 30X.5 LL (NEEDLE) ×3 IMPLANT
NEEDLE RETROBULBAR 25GX1.5 (NEEDLE) ×3 IMPLANT
NS IRRIG 1000ML POUR BTL (IV SOLUTION) ×3 IMPLANT
PACK FRAGMATOME (OPHTHALMIC) IMPLANT
PAD ARMBOARD 7.5X6 YLW CONV (MISCELLANEOUS) ×4 IMPLANT
PAK PIK VITRECTOMY CVS 25GA (OPHTHALMIC) ×3 IMPLANT
PENCIL BIPOLAR 25GA STR DISP (OPHTHALMIC RELATED) IMPLANT
PICK MICROPICK 25G (MISCELLANEOUS) IMPLANT
PROBE LASER ILLUM FLEX CVD 25G (OPHTHALMIC) IMPLANT
RETRACTOR IRIS FLEX 25G GRIESH (INSTRUMENTS) IMPLANT
ROLLS DENTAL (MISCELLANEOUS) ×4 IMPLANT
SCRAPER DIAMOND 25GA (OPHTHALMIC RELATED) IMPLANT
STOCKINETTE IMPERVIOUS 9X36 MD (GAUZE/BANDAGES/DRESSINGS) ×6 IMPLANT
STOPCOCK 4 WAY LG BORE MALE ST (IV SETS) IMPLANT
SUT VICRYL 7 0 TG140 8 (SUTURE) IMPLANT
SYR 20CC LL (SYRINGE) ×1 IMPLANT
SYR 5ML LL (SYRINGE) ×3 IMPLANT
SYR TB 1ML LUER SLIP (SYRINGE) ×2 IMPLANT
SYRINGE 10CC LL (SYRINGE) IMPLANT
TOWEL OR 17X24 6PK STRL BLUE (TOWEL DISPOSABLE) ×4 IMPLANT
TUBE CONNECTING 12'X1/4 (SUCTIONS)
TUBE CONNECTING 12X1/4 (SUCTIONS) IMPLANT
WATER STERILE IRR 1000ML POUR (IV SOLUTION) ×3 IMPLANT

## 2014-08-16 NOTE — Anesthesia Postprocedure Evaluation (Signed)
  Anesthesia Post-op Note  Patient: [name] [name]  Procedure(s) Performed: Procedure(s): PARS PLANA VITRECTOMY WITH 25 GAUGE (Left) MEMBRANE PEEL LEFT EYE  (Left)  Patient Location: PACU  Anesthesia Type:General  Level of Consciousness: awake, alert , oriented and patient cooperative  Airway and Oxygen Therapy: Patient Spontanous Breathing  Post-op Pain: none  Post-op Assessment: Post-op Vital signs reviewed, Patient's Cardiovascular Status Stable, Respiratory Function Stable, Patent Airway, No signs of Nausea or vomiting and Adequate PO intake  Post-op Vital Signs: Reviewed and stable  Last Vitals:  Filed Vitals:   08/16/14 1427  BP: 121/62  Pulse: 73  Temp:   Resp: 18    Complications: No apparent anesthesia complications

## 2014-08-16 NOTE — H&P (Signed)
[name] [name] is an 59 y.o. female.   Chief Complaint: blurred  Vision OS  HPI: complained of blurred distored vision in the left eye. Diagnosed with ERM OS.  Past Medical History  Diagnosis Date  . Carcinoid tumor of abdomen   . 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 Family history of adverse reaction to anesthesia     Mother had trouble waking up  . Pneumothorax on left   . Pneumothorax on right     Past Surgical History  Procedure Laterality Date  . Lung surgery  2008?    d/t pneumothroax  . Oophorectomy    . Whipple procedure    . Abdominal hysterectomy  04/23/1997  . Lasik    . Tonsillectomy      Family History  Problem Relation Age of Onset  . Cancer Mother   . Cancer Father   . Heart disease Father   . Cancer Sister   . Heart disease Sister   . Diabetes Brother    Social History:  reports that she has been smoking Cigarettes.  She has a 42 pack-year smoking history. She has never used smokeless tobacco. She reports that she drinks alcohol. She reports that she does not use illicit drugs.  Allergies: No Known Allergies  Medications Prior to Admission  Medication Sig Dispense Refill  . Calcium Carbonate-Vitamin D (CALTRATE 600+D PO) Take 2 tablets by mouth daily.     . cetirizine (ZYRTEC) 10 MG tablet Take 10 mg by mouth daily.    . Cholecalciferol (VITAMIN D3) 1000 UNITS CAPS Take 1 capsule by mouth daily.    . Multiple Vitamins-Minerals (MULTIVITAMINS THER. W/MINERALS) TABS Take 1 tablet by mouth daily.      No results found for this or any previous visit (from the past 48 hour(s)). No results found.  Review of Systems  Eyes: Positive for blurred vision.  Respiratory: Positive for cough.   All other systems reviewed and are negative.   Blood pressure 152/71, pulse 90, temperature 97.5 F (36.4 C), temperature source Oral, resp. rate 20, weight 52.617 kg (116 lb), SpO2 100 %. Physical Exam  Eyes: Conjunctivae and EOM are normal. Pupils are equal, round, and  reactive to light.       Assessment/Plan 1. ERM OS: PPV, MP OS.  Vilas Edgerly B 08/16/2014, 12:26 PM

## 2014-08-16 NOTE — Anesthesia Preprocedure Evaluation (Addendum)
Anesthesia Evaluation  Patient identified by MRN, date of birth, ID band Patient awake    Reviewed: Allergy & Precautions, NPO status , Patient's Chart, lab work & pertinent test results  History of Anesthesia Complications Negative for: history of anesthetic complications  Airway Mallampati: I       Dental  (+) Edentulous Upper, Edentulous Lower, Dental Advisory Given, Implants   Pulmonary Current Smoker,  breath sounds clear to auscultation        Cardiovascular negative cardio ROS  Rhythm:Regular Rate:Normal     Neuro/Psych    GI/Hepatic negative GI ROS, Neg liver ROS, [place] in past   Endo/Other  negative endocrine ROS  Renal/GU negative Renal ROS     Musculoskeletal   Abdominal   Peds  Hematology negative hematology ROS (+)   Anesthesia Other Findings   Reproductive/Obstetrics                           Anesthesia Physical Anesthesia Plan  ASA: II  Anesthesia Plan: General   Post-op Pain Management:    Induction: Intravenous  Airway Management Planned: Oral ETT  Additional Equipment:   Intra-op Plan:   Post-operative Plan: Extubation in OR  Informed Consent: I have reviewed the patients History and Physical, chart, labs and discussed the procedure including the risks, benefits and alternatives for the proposed anesthesia with the patient or authorized representative who has indicated his/her understanding and acceptance.   Dental advisory given  Plan Discussed with: CRNA and Surgeon  Anesthesia Plan Comments:         Anesthesia Quick Evaluation

## 2014-08-16 NOTE — Brief Op Note (Signed)
08/16/2014  1:49 PM  PATIENT:  [name] [name]  59 y.o. female  PRE-OPERATIVE DIAGNOSIS:  EPIRETINAL MEMBRANE LEFT EYE  POST-OPERATIVE DIAGNOSIS:  EPIRETINAL MEMBRANE LEFT EYE  PROCEDURE:  Procedure(s): PARS PLANA VITRECTOMY WITH 25 GAUGE (Left) MEMBRANE PEEL LEFT EYE  (Left)  SURGEON:  Surgeon(s) and Role:    * Sherlynn Stalls, MD - Primary  PHYSICIAN ASSISTANT:   ASSISTANTS: none   ANESTHESIA:   general  EBL:  Total I/O In: 500 [I.V.:500] Out: -   BLOOD ADMINISTERED:none  DRAINS: none   LOCAL MEDICATIONS USED:  BUPIVICAINE   SPECIMEN:  No Specimen  DISPOSITION OF SPECIMEN:  N/A  COUNTS:  YES  TOURNIQUET:  * No tourniquets in log *  DICTATION: .Other Dictation: Dictation Number [id number]  PLAN OF CARE: Discharge to home after PACU  PATIENT DISPOSITION:  PACU - hemodynamically stable.   Delay start of Pharmacological VTE agent (>24hrs) due to surgical blood loss or risk of bleeding: not applicable

## 2014-08-16 NOTE — Transfer of Care (Signed)
Immediate Anesthesia Transfer of Care Note  Patient: [name] [name]  Procedure(s) Performed: Procedure(s): PARS PLANA VITRECTOMY WITH 25 GAUGE (Left) MEMBRANE PEEL LEFT EYE  (Left)  Patient Location: PACU  Anesthesia Type:General  Level of Consciousness: awake, alert  and oriented  Airway & Oxygen Therapy: Patient Spontanous Breathing and Patient connected to nasal cannula oxygen  Post-op Assessment: Report given to RN  Post vital signs: Reviewed and stable  Last Vitals:  Filed Vitals:   08/16/14 1022  BP: 152/71  Pulse: 90  Temp: 36.4 C  Resp: 20    Complications: No apparent anesthesia complications

## 2014-08-16 NOTE — Discharge Instructions (Signed)
Per Dr. Baird Cancer' bedside instructions to Ms. Matteo: 1. Keep the dressing on and dry until your follow up appt in the morning 08/17/14 at 8 am.  2. You may use tylenol as needed for pain.  3. You may rest in any position for your comfort.  4. If you have any questions Call Dr. Baird Cancer' office.

## 2014-08-16 NOTE — Anesthesia Procedure Notes (Signed)
Procedure Name: Intubation Date/Time: 08/16/2014 12:44 PM Performed by: Barrington Ellison Pre-anesthesia Checklist: Patient identified, Emergency Drugs available, Suction available, Patient being monitored and Timeout performed Patient Re-evaluated:Patient Re-evaluated prior to inductionOxygen Delivery Method: Circle system utilized Preoxygenation: Pre-oxygenation with 100% oxygen Intubation Type: IV induction Ventilation: Mask ventilation without difficulty Laryngoscope Size: Mac and 3 Tube type: Oral Tube size: 7.0 mm Number of attempts: 1 Airway Equipment and Method: Stylet Placement Confirmation: ETT inserted through vocal cords under direct vision,  positive ETCO2 and breath sounds checked- equal and bilateral Secured at: 20 cm Tube secured with: Tape Dental Injury: Teeth and Oropharynx as per pre-operative assessment

## 2014-08-17 ENCOUNTER — Encounter (HOSPITAL_COMMUNITY): Payer: Self-pay | Admitting: Ophthalmology

## 2014-08-17 NOTE — Op Note (Signed)
[name] [name], [name] [name] NO.:  [id number]  MEDICAL RECORD NO.:  [id number]  LOCATION:  [facility]                         FACILITY:  [facility]  PHYSICIAN:  Corliss Parish, MD    DATE OF BIRTH:  03-18-1956  DATE OF PROCEDURE:  08/16/2014 DATE OF DISCHARGE:  08/16/2014                              OPERATIVE REPORT   SURGEON:  Corene Cornea B. Baird Cancer, MD.  PREOPERATIVE DIAGNOSIS:  Epiretinal membrane, left eye.  POSTOPERATIVE DIAGNOSIS:  Epiretinal membrane, left eye.  ANESTHESIA:  General anesthesia was administered.  COMPLICATIONS:  None.  FINDINGS: Epiretinal membrane in the left eye.  DESCRIPTION OF PROCEDURE:  After the patient was identified in the preoperative holding area, she was taken to the operative room where she was sedated and placed under general anesthesia.  At that point in time, the left eye was anesthetized using a retrobulbar block consisting of a 1:1 mixture of 0.75% bupivacaine and 1% lidocaine with 150 units of Halonix.  The retrobulbar needle was removed and then, the eye was prepped and draped in usual sterile fashion per ocular surgery.  Once the eye was prepped and draped, the eye was prepared for a standard vitrectomy.  A Lieberman speculum was placed between the left upper and lower eyelids for exposure.  The 25-gauge trocars were used to introduce transconjunctival cannulas in inferotemporal, superotemporal, and superonasal quadrants.  The trocars were removed leaving the cannulas in place.  An infusion cannula was then placed into the inferior-temporal cannula to allow infusion of the vitreous cavity.  Next, I underwent a core vitrectomy with vitreous cutter and light pipe.  The vitreous was then trimmed out to the periphery.  The BIOM was used for visualization. After the vitreous was removed, the BIOM was removed from the microscope.  A contact lens was then placed on the cornea for high magnification at the macular surface.  Internal  limiting membrane forceps were then used to gently grasp and peel the epiretinal tissue off the macular surface in a nontraumatic fashion.  Once this tissue was removed from the macula, it was then removed from the eye using the vitrector.  A very nice atraumatic peel was performed without difficulty.  Closed inspection revealed no residual membrane tissue. There was no staining available at the hospital.  At the time of surgery, staining was not performed.  Once this was completed, the eye was inspected with scleral compression.  There was no retinal tear detached and seen by careful study of the first exam.  Therefore, the cannulas were removed from the sclerotomies.  The sclerotomies were inspected and found to be watertight.  The eye was treated with subconjunctival injections of 15 mg of Ancef and 1 mg of dexamethasone. The eye was then treated tropically with one drop of 1% atropine and TobraDex ointment. Speculum was removed.  The eyelids were cleaned and closed.  They were then patched and shielded.  The patient was then taken to the recovery in excellent condition having tolerated the procedure very well.     Corliss Parish, MD     JBS/MEDQ  D:  08/16/2014  T:  08/17/2014  Job:  [id number]

## 2014-12-10 ENCOUNTER — Encounter: Payer: Self-pay | Admitting: Medical

## 2014-12-10 ENCOUNTER — Ambulatory Visit (INDEPENDENT_AMBULATORY_CARE_PROVIDER_SITE_OTHER): Payer: 59 | Admitting: Medical

## 2014-12-10 VITALS — BP 130/80 | HR 68 | Temp 98.2°F | Resp 16 | Ht 62.5 in | Wt 115.0 lb

## 2014-12-10 DIAGNOSIS — R591 Generalized enlarged lymph nodes: Secondary | ICD-10-CM

## 2014-12-10 DIAGNOSIS — J029 Acute pharyngitis, unspecified: Secondary | ICD-10-CM | POA: Diagnosis not present

## 2014-12-10 DIAGNOSIS — R61 Generalized hyperhidrosis: Secondary | ICD-10-CM

## 2014-12-10 LAB — CBC WITH DIFFERENTIAL/PLATELET
Basophils Absolute: 0.1 10*3/uL (ref 0.0–0.1)
Basophils Relative: 0.5 % (ref 0.0–3.0)
Eosinophils Absolute: 0.1 10*3/uL (ref 0.0–0.7)
Eosinophils Relative: 0.9 % (ref 0.0–5.0)
HCT: 44.7 % (ref 36.0–46.0)
Hemoglobin: 14.6 g/dL (ref 12.0–15.0)
Lymphocytes Relative: 35.3 % (ref 12.0–46.0)
Lymphs Abs: 5 10*3/uL — ABNORMAL HIGH (ref 0.7–4.0)
MCHC: 32.6 g/dL (ref 30.0–36.0)
MCV: 88.4 fl (ref 78.0–100.0)
Monocytes Absolute: 1.1 10*3/uL — ABNORMAL HIGH (ref 0.1–1.0)
Monocytes Relative: 7.7 % (ref 3.0–12.0)
Neutro Abs: 7.9 10*3/uL — ABNORMAL HIGH (ref 1.4–7.7)
Neutrophils Relative %: 55.6 % (ref 43.0–77.0)
Platelets: 411 10*3/uL — ABNORMAL HIGH (ref 150.0–400.0)
RBC: 5.06 Mil/uL (ref 3.87–5.11)
RDW: 14.7 % (ref 11.5–15.5)
WBC: 14.2 10*3/uL — ABNORMAL HIGH (ref 4.0–10.5)

## 2014-12-10 LAB — POCT RAPID STREP A (OFFICE): Rapid Strep A Screen: NEGATIVE

## 2014-12-10 MED ORDER — FLUTICASONE PROPIONATE 50 MCG/ACT NA SUSP: 2.0000 | Freq: Every day | NASAL | Status: DC

## 2014-12-10 MED ORDER — AZITHROMYCIN 250 MG PO TABS: ORAL_TABLET | ORAL | Status: DC

## 2014-12-10 NOTE — Patient Instructions (Signed)
Your strep test was negative. However, your physical exam and clinical presentation is suspicious for strep and it is important to note that rapid strep test can be falsely negative. So I am going to give you azithromycin antibiotic today based on your exam and clinical presentation.  Rest hydrate, tylenol for fever, and warm salt water gargles.    Rx flonase to help with your allergies.  With you clinical presentation and night sweats for one year I do want to get cbc. Please follow up.    Follow up in 10 days or as needed.

## 2014-12-10 NOTE — Progress Notes (Signed)
Pre visit review using our clinic review tool, if applicable. No additional management support is needed unless otherwise documented below in the visit note. 

## 2014-12-10 NOTE — Progress Notes (Signed)
Subjective:    Patient ID: [name] [name], female    DOB: 09-20-1955, 59 y.o.   MRN: [id number]  HPI   Pt has some st for one week. Also some swelling over her left submandibular area. No fever, no chills but some night sweats. But pt states she had ovaries and uterus removed in 1999. She has had some sweats 6-8 months in evenings.  Yesterday at [employer] some thigh soreness. No family members with sore throat.   Some sneezing, itching and runny nose. She is on  zyrtec. Year round allergies. Mild sneezing  increase recently.     Review of Systems  Constitutional: Positive for diaphoresis. Negative for fever, chills and fatigue.       Some night sweats.  HENT: Positive for congestion, sneezing and sore throat. Negative for postnasal drip, trouble swallowing and voice change.   Eyes: Positive for itching.  Respiratory: Negative for cough, chest tightness, shortness of breath and wheezing.   Cardiovascular: Negative for chest pain and palpitations.  Gastrointestinal: Negative for abdominal pain.  Musculoskeletal: Negative for back pain.       Mild thigh soreness yesterday.  Hematological: Positive for adenopathy. Does not bruise/bleed easily.   Past Medical History  Diagnosis Date  . Carcinoid tumor of abdomen   . 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 Family history of adverse reaction to anesthesia     Mother had trouble waking up  . Pneumothorax on left   . Pneumothorax on right     Social History   Social History  . Marital Status: Single    Spouse Name: N/A  . Number of Children: 1  . Years of Education: N/A   Occupational History  . Market researcher    Social History Main Topics  . Smoking status: Current Every Day Smoker -- 1.00 packs/day for 42 years    Types: Cigarettes  . Smokeless tobacco: Never Used  . Alcohol Use: Yes     Comment: RARE 1-2 drinks year  . Drug Use: No  . Sexual Activity: Not on file   Other Topics Concern  . Not on file   Social History  Narrative   Single   Adopted daughter   Dog          Past Surgical History  Procedure Laterality Date  . Lung surgery  2008?    d/t pneumothroax  . Oophorectomy    . Whipple procedure    . Abdominal hysterectomy  04/23/1997  . Lasik    . Tonsillectomy    . Pars plana vitrectomy Left 08/16/2014    Procedure: PARS PLANA VITRECTOMY WITH 25 GAUGE;  Surgeon: Sherlynn Stalls, MD;  Location: [facility];  Service: Ophthalmology;  Laterality: Left;  Marland Kitchen Membrane peel Left 08/16/2014    Procedure: MEMBRANE PEEL LEFT EYE ;  Surgeon: Sherlynn Stalls, MD;  Location: [facility];  Service: Ophthalmology;  Laterality: Left;    Family History  Problem Relation Age of Onset  . Cancer Mother   . Cancer Father   . Heart disease Father   . Cancer Sister   . Heart disease Sister   . Diabetes Brother     No Known Allergies  Current Outpatient Prescriptions on File Prior to Visit  Medication Sig Dispense Refill  . Calcium Carbonate-Vitamin D (CALTRATE 600+D PO) Take 2 tablets by mouth daily.     . cetirizine (ZYRTEC) 10 MG tablet Take 10 mg by mouth daily.    . Cholecalciferol (  VITAMIN D3) 1000 UNITS CAPS Take 1 capsule by mouth daily.    . Multiple Vitamins-Minerals (MULTIVITAMINS THER. W/MINERALS) TABS Take 1 tablet by mouth daily.    . [DISCONTINUED] zolpidem (AMBIEN) 5 MG tablet Take 5 mg by mouth at bedtime as needed.       No current facility-administered medications on file prior to visit.    BP 130/80 mmHg  Pulse 68  Temp(Src) 98.2 F (36.8 C) (Oral)  Resp 16  Ht 5' 2.5" (1.588 m)  Wt 115 lb (52.164 kg)  BMI 20.69 kg/m2  SpO2 98%       Objective:   Physical Exam  General  Mental Status - Alert. General Appearance - Well groomed. Not in acute distress.  Skin Rashes- No Rashes.  HEENT Head- Normal. Ear Auditory Canal - Left- Normal. Right - Normal.Tympanic Membrane- Left- Normal. Right- Normal. Eye Sclera/Conjunctiva- Left- Normal. Right- Normal. Nose & Sinuses Nasal Mucosa- Left-   Boggy and Congested. Right-  Boggy and  Congested.Bilateral no maxillary and  No frontal sinus pressure. Mouth & Throat Lips: Upper Lip- Normal: no dryness, cracking, pallor, cyanosis, or vesicular eruption. Lower Lip-Normal: no dryness, cracking, pallor, cyanosis or vesicular eruption. Buccal Mucosa- Bilateral- No Aphthous ulcers. Oropharynx- No Discharge but moderate Erythema. +pndTonsils: Characteristics- Bilateral- No Erythema or Congestion. Size/Enlargement- Bilateral- 1+ enlargement. Discharge- bilateral-None.  Neck Neck- Supple. No Masses.   Chest and Lung Exam Auscultation: Breath Sounds:-Clear even and unlabored.  Cardiovascular Auscultation:Rythm- Regular, rate and rhythm. Murmurs & Other Heart Sounds:Ausculatation of the heart reveal- No Murmurs.  Lymphatic Head & Neck General Head & Neck Lymphatics: Bilateral: Description- Lt submandibular node moderate enlarged.       Assessment & Plan:  Your strep test was negative. However, your physical exam and clinical presentation is suspicious for strep and it is important to note that rapid strep test can be falsely negative. So I am going to give you azithromycin antibiotic today based on your exam and clinical presentation.  Rest hydrate, tylenol for fever, and warm salt water gargles.    Rx flonase to help with your allergies.  With you clinical presentation and night sweats for one year I do want to get cbc. Please follow up.    Follow up in 10 days or as needed.

## 2014-12-29 ENCOUNTER — Encounter: Payer: Self-pay | Admitting: Gastroenterology

## 2014-12-29 ENCOUNTER — Ambulatory Visit (INDEPENDENT_AMBULATORY_CARE_PROVIDER_SITE_OTHER): Payer: 59 | Admitting: Family Medicine

## 2014-12-29 ENCOUNTER — Encounter: Payer: Self-pay | Admitting: Family Medicine

## 2014-12-29 VITALS — BP 122/82 | HR 80 | Temp 97.9°F | Resp 16 | Ht 63.0 in | Wt 114.0 lb

## 2014-12-29 DIAGNOSIS — R590 Localized enlarged lymph nodes: Secondary | ICD-10-CM

## 2014-12-29 DIAGNOSIS — Z23 Encounter for immunization: Secondary | ICD-10-CM | POA: Diagnosis not present

## 2014-12-29 DIAGNOSIS — Z1231 Encounter for screening mammogram for malignant neoplasm of breast: Secondary | ICD-10-CM

## 2014-12-29 DIAGNOSIS — Z1211 Encounter for screening for malignant neoplasm of colon: Secondary | ICD-10-CM

## 2014-12-29 NOTE — Progress Notes (Signed)
Pre visit review using our clinic review tool, if applicable. No additional management support is needed unless otherwise documented below in the visit note. 

## 2014-12-29 NOTE — Patient Instructions (Signed)
Follow up as needed We'll call you with your GI and mammogram appts Keep up the good work!  You look great! Call with any questions or concerns Have a great fall season!!!

## 2014-12-29 NOTE — Progress Notes (Signed)
   Subjective:    Patient ID: [name] [name], female    DOB: 05/26/1955, 59 y.o.   MRN: [id number]  HPI LAD- pt was seen on 8/19 for URI.  At that time, pt was noted to have swollen lymph nodes.  Pt feels lymph nodes have improved.  PND has improved w/ use of Flonase.  Pt also taking Zyrtec daily.   Review of Systems For ROS see HPI     Objective:   Physical Exam  Constitutional: She is oriented to person, place, and time. She appears well-developed and well-nourished. No distress.  HENT:  Head: Normocephalic and atraumatic.  Right Ear: Tympanic membrane normal.  Left Ear: Tympanic membrane normal.  Nose: Mucosal edema and rhinorrhea present. Right sinus exhibits no maxillary sinus tenderness and no frontal sinus tenderness. Left sinus exhibits no maxillary sinus tenderness and no frontal sinus tenderness.  Mouth/Throat: Mucous membranes are normal. Posterior oropharyngeal erythema (w/ PND) present.  Eyes: Conjunctivae and EOM are normal. Pupils are equal, round, and reactive to light.  Neck: Normal range of motion. Neck supple.  Cardiovascular: Normal rate, regular rhythm and normal heart sounds.   Pulmonary/Chest: Effort normal and breath sounds normal. No respiratory distress. She has no wheezes. She has no rales.  Lymphadenopathy:    She has no cervical adenopathy.  Neurological: She is alert and oriented to person, place, and time.  Psychiatric: She has a normal mood and affect. Her behavior is normal.  Vitals reviewed.         Assessment & Plan:

## 2015-01-01 NOTE — Assessment & Plan Note (Signed)
Resolved.  Pt w/o any LAD on exam today.  Reassurance provided.

## 2015-01-13 ENCOUNTER — Other Ambulatory Visit: Payer: Self-pay | Admitting: Family Medicine

## 2015-01-13 DIAGNOSIS — Z1231 Encounter for screening mammogram for malignant neoplasm of breast: Secondary | ICD-10-CM

## 2015-02-11 ENCOUNTER — Ambulatory Visit
Admission: RE | Admit: 2015-02-11 | Discharge: 2015-02-11 | Disposition: A | Discharge status: Home / Self Care | Payer: 59 | Source: Ambulatory Visit | Attending: Family Medicine | Admitting: Family Medicine

## 2015-02-11 ENCOUNTER — Ambulatory Visit (AMBULATORY_SURGERY_CENTER): Payer: Self-pay

## 2015-02-11 VITALS — Ht 63.0 in | Wt 114.0 lb

## 2015-02-11 DIAGNOSIS — Z1211 Encounter for screening for malignant neoplasm of colon: Secondary | ICD-10-CM

## 2015-02-11 DIAGNOSIS — Z1231 Encounter for screening mammogram for malignant neoplasm of breast: Secondary | ICD-10-CM

## 2015-02-11 MED ORDER — NA SULFATE-K SULFATE-MG SULF 17.5-3.13-1.6 GM/177ML PO SOLN: 1.0000 | Freq: Once | ORAL | Status: DC

## 2015-02-11 NOTE — Progress Notes (Signed)
No egg or soy allergies Not on home 02 No previous anesthesia complications No diet or weight loss meds Sample of Suprep given to patient, lot # [id number] exp 9-18

## 2015-02-25 ENCOUNTER — Ambulatory Visit (AMBULATORY_SURGERY_CENTER): Payer: 59 | Admitting: Gastroenterology

## 2015-02-25 ENCOUNTER — Encounter: Payer: Self-pay | Admitting: Gastroenterology

## 2015-02-25 VITALS — BP 136/60 | HR 61 | Temp 95.5°F | Resp 26 | Ht 63.0 in | Wt 114.0 lb

## 2015-02-25 DIAGNOSIS — Z1211 Encounter for screening for malignant neoplasm of colon: Secondary | ICD-10-CM

## 2015-02-25 MED ORDER — SODIUM CHLORIDE 0.9 % IV SOLN: 500.0000 mL | INTRAVENOUS | Status: DC

## 2015-02-25 NOTE — Patient Instructions (Signed)
Findings:  Mild diverticulosis Recommendations:  Repeat colonoscopy in 10 years  YOU HAD AN ENDOSCOPIC PROCEDURE TODAY AT [facility]:   Refer to the procedure report that was given to you for any specific questions about what was found during the examination.  If the procedure report does not answer your questions, please call your gastroenterologist to clarify.  If you requested that your care partner not be given the details of your procedure findings, then the procedure report has been included in a sealed envelope for you to review at your convenience later.  YOU SHOULD EXPECT: Some feelings of bloating in the abdomen. Passage of more gas than usual.  Walking can help get rid of the air that was put into your GI tract during the procedure and reduce the bloating. If you had a lower endoscopy (such as a colonoscopy or flexible sigmoidoscopy) you may notice spotting of blood in your stool or on the toilet paper. If you underwent a bowel prep for your procedure, you may not have a normal bowel movement for a few days.  Please Note:  You might notice some irritation and congestion in your nose or some drainage.  This is from the oxygen used during your procedure.  There is no need for concern and it should clear up in a day or so.  SYMPTOMS TO REPORT IMMEDIATELY:   Following lower endoscopy (colonoscopy or flexible sigmoidoscopy):  Excessive amounts of blood in the stool  Significant tenderness or worsening of abdominal pains  Swelling of the abdomen that is new, acute  Fever of 100F or higher   Following upper endoscopy (EGD)  Vomiting of blood or coffee ground material  New chest pain or pain under the shoulder blades  Painful or persistently difficult swallowing  New shortness of breath  Fever of 100F or higher  Black, tarry-looking stools  For urgent or emergent issues, a gastroenterologist can be reached at any hour by calling [phone].   DIET: Your first  meal following the procedure should be a small meal and then it is ok to progress to your normal diet. Heavy or fried foods are harder to digest and may make you feel nauseous or bloated.  Likewise, meals heavy in dairy and vegetables can increase bloating.  Drink plenty of fluids but you should avoid alcoholic beverages for 24 hours.  ACTIVITY:  You should plan to take it easy for the rest of today and you should NOT DRIVE or use heavy machinery until tomorrow (because of the sedation medicines used during the test).    FOLLOW UP: Our staff will call the number listed on your records the next business day following your procedure to check on you and address any questions or concerns that you may have regarding the information given to you following your procedure. If we do not reach you, we will leave a message.  However, if you are feeling well and you are not experiencing any problems, there is no need to return our call.  We will assume that you have returned to your regular daily activities without incident.  If any biopsies were taken you will be contacted by phone or by letter within the next 1-3 weeks.  Please call us at [phone] if you have not heard about the biopsies in 3 weeks.    SIGNATURES/CONFIDENTIALITY: You and/or your care partner have signed paperwork which will be entered into your electronic medical record.  These signatures attest to the fact that  that the information above on your After Visit Summary has been reviewed and is understood.  Full responsibility of the confidentiality of this discharge information lies with you and/or your care-partner.  Please follow all discharge instructions given to you by the recovery room nurse. If you have any questions or problems after discharge please call one of the numbers listed above. You will receive a phone call in the am to see how you are doing and answer any questions you may have. Thank you for choosing [facility] for your health care needs.

## 2015-02-25 NOTE — Op Note (Signed)
[facility] [place]  [address] [place], [id number]   COLONOSCOPY PROCEDURE REPORT  PATIENT: [name], [name]  MR#: [id number] BIRTHDATE: June 23, 1955 , 40  yrs. old GENDER: female ENDOSCOPIST: Milus Banister, MD PROCEDURE DATE:  02/25/2015 PROCEDURE:   Colonoscopy, screening First Screening Colonoscopy - Avg.  risk and is 50 yrs.  old or older - No.  Prior Negative Screening - Now for repeat screening. 10 or more years since last screening  History of Adenoma - Now for follow-up colonoscopy & has been > or = to 3 yrs.  N/A  Recommend repeat exam, <10 yrs? No ASA CLASS:   Class II INDICATIONS:Screening for colonic neoplasia, Colorectal Neoplasm Risk Assessment for this procedure is average risk, and Colonoscopy 11/2004 Dr.  Earlean Shawl found no polyps. MEDICATIONS: Monitored anesthesia care and Propofol 200 mg IV  DESCRIPTION OF PROCEDURE:   After the risks benefits and alternatives of the procedure were thoroughly explained, informed consent was obtained.  The digital rectal exam revealed no abnormalities of the rectum.   The LB [id number]  endoscope was introduced through the anus and advanced to the cecum, which was identified by both the appendix and ileocecal valve. No adverse events experienced.   The quality of the prep was excellent.  The instrument was then slowly withdrawn as the colon was fully examined. Estimated blood loss is zero unless otherwise noted in this procedure report.    COLON FINDINGS: There was mild diverticulosis noted in the left colon.   The examination was otherwise normal.  Retroflexed views revealed no abnormalities. The time to cecum = 2.3 Withdrawal time = 5.7   The scope was withdrawn and the procedure completed. COMPLICATIONS: There were no immediate complications.  ENDOSCOPIC IMPRESSION: 1.   Mild diverticulosis was noted in the left colon 2.   The examination was otherwise normal  RECOMMENDATIONS: You should continue to  follow colorectal cancer screening guidelines for "routine risk" patients with a repeat colonoscopy in 10 years. There is no need for FOBT (stool) testing for at least 5 years.  eSigned:  Milus Banister, MD 02/25/2015 7:51 AM

## 2015-02-25 NOTE — Progress Notes (Signed)
To recovery, report to Smith, RN, VSS 

## 2015-02-28 ENCOUNTER — Telehealth: Payer: Self-pay

## 2015-02-28 NOTE — Telephone Encounter (Signed)
  Follow up Call-  Call back number 02/25/2015  Post procedure Call Back phone  # [phone]  Permission to leave phone message Yes     Patient questions:  Do you have a fever, pain , or abdominal swelling? No. Pain Score  0 *  Have you tolerated food without any problems? Yes.    Have you been able to return to your normal activities? Yes.    Do you have any questions about your discharge instructions: Diet   No. Medications  No. Follow up visit  No.  Do you have questions or concerns about your Care? No.  Actions: * If pain score is 4 or above: No action needed, pain <4.  No problems noted per the pt. maw

## 2015-03-03 ENCOUNTER — Encounter: Payer: Self-pay | Admitting: Family Medicine

## 2015-07-15 ENCOUNTER — Encounter: Payer: 59 | Admitting: Family Medicine

## 2015-07-25 ENCOUNTER — Encounter: Payer: Self-pay | Admitting: Behavioral Health

## 2015-07-25 ENCOUNTER — Telehealth: Payer: Self-pay | Admitting: Behavioral Health

## 2015-07-25 NOTE — Telephone Encounter (Signed)
Pre-Visit Call completed with patient and chart updated.   Pre-Visit Info documented in Specialty Comments under [employer].    

## 2015-07-26 ENCOUNTER — Ambulatory Visit (HOSPITAL_BASED_OUTPATIENT_CLINIC_OR_DEPARTMENT_OTHER)
Admission: RE | Admit: 2015-07-26 | Discharge: 2015-07-26 | Disposition: A | Discharge status: Home / Self Care | Payer: BLUE CROSS/BLUE SHIELD | Source: Ambulatory Visit | Attending: Family Medicine | Admitting: Family Medicine

## 2015-07-26 ENCOUNTER — Other Ambulatory Visit: Payer: Self-pay | Admitting: Family Medicine

## 2015-07-26 ENCOUNTER — Ambulatory Visit (INDEPENDENT_AMBULATORY_CARE_PROVIDER_SITE_OTHER): Payer: BLUE CROSS/BLUE SHIELD | Admitting: Family Medicine

## 2015-07-26 ENCOUNTER — Encounter: Payer: Self-pay | Admitting: Family Medicine

## 2015-07-26 VITALS — BP 122/70 | HR 96 | Temp 98.2°F | Ht 63.0 in | Wt 120.2 lb

## 2015-07-26 DIAGNOSIS — Z23 Encounter for immunization: Secondary | ICD-10-CM

## 2015-07-26 DIAGNOSIS — J449 Chronic obstructive pulmonary disease, unspecified: Secondary | ICD-10-CM

## 2015-07-26 DIAGNOSIS — R059 Cough, unspecified: Secondary | ICD-10-CM

## 2015-07-26 DIAGNOSIS — R05 Cough: Secondary | ICD-10-CM | POA: Diagnosis not present

## 2015-07-26 DIAGNOSIS — Z0001 Encounter for general adult medical examination with abnormal findings: Secondary | ICD-10-CM | POA: Diagnosis not present

## 2015-07-26 DIAGNOSIS — Z0000 Encounter for general adult medical examination without abnormal findings: Secondary | ICD-10-CM

## 2015-07-26 DIAGNOSIS — M8580 Other specified disorders of bone density and structure, unspecified site: Secondary | ICD-10-CM | POA: Diagnosis not present

## 2015-07-26 DIAGNOSIS — Z114 Encounter for screening for human immunodeficiency virus [HIV]: Secondary | ICD-10-CM | POA: Diagnosis not present

## 2015-07-26 DIAGNOSIS — Z1159 Encounter for screening for other viral diseases: Secondary | ICD-10-CM

## 2015-07-26 DIAGNOSIS — F17200 Nicotine dependence, unspecified, uncomplicated: Secondary | ICD-10-CM | POA: Insufficient documentation

## 2015-07-26 DIAGNOSIS — R9389 Abnormal findings on diagnostic imaging of other specified body structures: Secondary | ICD-10-CM

## 2015-07-26 DIAGNOSIS — R918 Other nonspecific abnormal finding of lung field: Secondary | ICD-10-CM | POA: Insufficient documentation

## 2015-07-26 LAB — COMPREHENSIVE METABOLIC PANEL
ALT: 16 U/L (ref 0–35)
AST: 18 U/L (ref 0–37)
Albumin: 4 g/dL (ref 3.5–5.2)
Alkaline Phosphatase: 74 U/L (ref 39–117)
BUN: 11 mg/dL (ref 6–23)
CO2: 26 mEq/L (ref 19–32)
Calcium: 9.7 mg/dL (ref 8.4–10.5)
Chloride: 104 mEq/L (ref 96–112)
Creatinine, Ser: 0.63 mg/dL (ref 0.40–1.20)
GFR: 101.88 mL/min (ref >60.00)
Glucose, Bld: 95 mg/dL (ref 70–99)
Potassium: 3.8 mEq/L (ref 3.5–5.1)
Sodium: 139 mEq/L (ref 135–145)
Total Bilirubin: 0.3 mg/dL (ref 0.2–1.2)
Total Protein: 7.7 g/dL (ref 6.0–8.3)

## 2015-07-26 LAB — LIPID PANEL
Cholesterol: 191 mg/dL (ref 0–200)
HDL: 46.7 mg/dL (ref >39.00)
LDL Cholesterol: 118 mg/dL — ABNORMAL HIGH (ref 0–99)
NonHDL: 144.01
Total CHOL/HDL Ratio: 4
Triglycerides: 128 mg/dL (ref 0.0–149.0)
VLDL: 25.6 mg/dL (ref 0.0–40.0)

## 2015-07-26 LAB — CBC WITH DIFFERENTIAL/PLATELET
Basophils Absolute: 0.1 10*3/uL (ref 0.0–0.1)
Basophils Relative: 0.4 % (ref 0.0–3.0)
Eosinophils Absolute: 0.2 10*3/uL (ref 0.0–0.7)
Eosinophils Relative: 1.1 % (ref 0.0–5.0)
HCT: 43.6 % (ref 36.0–46.0)
Hemoglobin: 14.2 g/dL (ref 12.0–15.0)
Lymphocytes Relative: 31.1 % (ref 12.0–46.0)
Lymphs Abs: 4.6 10*3/uL — ABNORMAL HIGH (ref 0.7–4.0)
MCHC: 32.7 g/dL (ref 30.0–36.0)
MCV: 86.7 fl (ref 78.0–100.0)
Monocytes Absolute: 1.2 10*3/uL — ABNORMAL HIGH (ref 0.1–1.0)
Monocytes Relative: 8.2 % (ref 3.0–12.0)
Neutro Abs: 8.8 10*3/uL — ABNORMAL HIGH (ref 1.4–7.7)
Neutrophils Relative %: 59.2 % (ref 43.0–77.0)
Platelets: 384 10*3/uL (ref 150.0–400.0)
RBC: 5.03 Mil/uL (ref 3.87–5.11)
RDW: 15 % (ref 11.5–15.5)
WBC: 14.9 10*3/uL — ABNORMAL HIGH (ref 4.0–10.5)

## 2015-07-26 LAB — VITAMIN D 25 HYDROXY (VIT D DEFICIENCY, FRACTURES): VITD: 60.37 ng/mL (ref 30.00–100.00)

## 2015-07-26 LAB — TSH: TSH: 2.55 u[IU]/mL (ref 0.35–4.50)

## 2015-07-26 LAB — HIV ANTIBODY (ROUTINE TESTING W REFLEX): HIV 1&2 Ab, 4th Generation: NONREACTIVE

## 2015-07-26 LAB — HEPATITIS C ANTIBODY: HCV Ab: NEGATIVE

## 2015-07-26 NOTE — Patient Instructions (Signed)
Preventive Care for Adults, Female A healthy lifestyle and preventive care can promote health and wellness. Preventive health guidelines for women include the following key practices.  A routine yearly physical is a good way to check with your health care provider about your health and preventive screening. It is a chance to share any concerns and updates on your health and to receive a thorough exam.  Visit your dentist for a routine exam and preventive care every 6 months. Brush your teeth twice a day and floss once a day. Good oral hygiene prevents tooth decay and gum disease.  The frequency of eye exams is based on your age, health, family medical history, use of contact lenses, and other factors. Follow your health care provider's recommendations for frequency of eye exams.  Eat a healthy diet. Foods like vegetables, fruits, whole grains, low-fat dairy products, and lean protein foods contain the nutrients you need without too many calories. Decrease your intake of foods high in solid fats, added sugars, and salt. Eat the right amount of calories for you.Get information about a proper diet from your health care provider, if necessary.  Regular physical exercise is one of the most important things you can do for your health. Most adults should get at least 150 minutes of moderate-intensity exercise (any activity that increases your heart rate and causes you to sweat) each week. In addition, most adults need muscle-strengthening exercises on 2 or more days a week.  Maintain a healthy weight. The body mass index (BMI) is a screening tool to identify possible weight problems. It provides an estimate of body fat based on height and weight. Your health care provider can find your BMI and can help you achieve or maintain a healthy weight.For adults 20 years and older:  A BMI below 18.5 is considered underweight.  A BMI of 18.5 to 24.9 is normal.  A BMI of 25 to 29.9 is considered overweight.  A  BMI of 30 and above is considered obese.  Maintain normal blood lipids and cholesterol levels by exercising and minimizing your intake of saturated fat. Eat a balanced diet with plenty of fruit and vegetables. Blood tests for lipids and cholesterol should begin at age 45 and be repeated every 5 years. If your lipid or cholesterol levels are high, you are over 50, or you are at high risk for heart disease, you may need your cholesterol levels checked more frequently.Ongoing high lipid and cholesterol levels should be treated with medicines if diet and exercise are not working.  If you smoke, find out from your health care provider how to quit. If you do not use tobacco, do not start.  Lung cancer screening is recommended for adults aged 45-80 years who are at high risk for developing lung cancer because of a history of smoking. A yearly low-dose CT scan of the lungs is recommended for people who have at least a 30-pack-year history of smoking and are a current smoker or have quit within the past 15 years. A pack year of smoking is smoking an average of 1 pack of cigarettes a day for 1 year (for example: 1 pack a day for 30 years or 2 packs a day for 15 years). Yearly screening should continue until the smoker has stopped smoking for at least 15 years. Yearly screening should be stopped for people who develop a health problem that would prevent them from having lung cancer treatment.  If you are pregnant, do not drink alcohol. If you are  breastfeeding, be very cautious about drinking alcohol. If you are not pregnant and choose to drink alcohol, do not have more than 1 drink per day. One drink is considered to be 12 ounces (355 mL) of beer, 5 ounces (148 mL) of wine, or 1.5 ounces (44 mL) of liquor.  Avoid use of street drugs. Do not share needles with anyone. Ask for help if you need support or instructions about stopping the use of drugs.  High blood pressure causes heart disease and increases the risk  of stroke. Your blood pressure should be checked at least every 1 to 2 years. Ongoing high blood pressure should be treated with medicines if weight loss and exercise do not work.  If you are 55-79 years old, ask your health care provider if you should take aspirin to prevent strokes.  Diabetes screening is done by taking a blood sample to check your blood glucose level after you have not eaten for a certain period of time (fasting). If you are not overweight and you do not have risk factors for diabetes, you should be screened once every 3 years starting at age 45. If you are overweight or obese and you are 40-70 years of age, you should be screened for diabetes every year as part of your cardiovascular risk assessment.  Breast cancer screening is essential preventive care for women. You should practice "breast self-awareness." This means understanding the normal appearance and feel of your breasts and may include breast self-examination. Any changes detected, no matter how small, should be reported to a health care provider. Women in their 20s and 30s should have a clinical breast exam (CBE) by a health care provider as part of a regular health exam every 1 to 3 years. After age 40, women should have a CBE every year. Starting at age 40, women should consider having a mammogram (breast X-ray test) every year. Women who have a family history of breast cancer should talk to their health care provider about genetic screening. Women at a high risk of breast cancer should talk to their health care providers about having an MRI and a mammogram every year.  Breast cancer gene (BRCA)-related cancer risk assessment is recommended for women who have family members with BRCA-related cancers. BRCA-related cancers include breast, ovarian, tubal, and peritoneal cancers. Having family members with these cancers may be associated with an increased risk for harmful changes (mutations) in the breast cancer genes BRCA1 and  BRCA2. Results of the assessment will determine the need for genetic counseling and BRCA1 and BRCA2 testing.  Your health care provider may recommend that you be screened regularly for cancer of the pelvic organs (ovaries, uterus, and vagina). This screening involves a pelvic examination, including checking for microscopic changes to the surface of your cervix (Pap test). You may be encouraged to have this screening done every 3 years, beginning at age 21.  For women ages 30-65, health care providers may recommend pelvic exams and Pap testing every 3 years, or they may recommend the Pap and pelvic exam, combined with testing for human papilloma virus (HPV), every 5 years. Some types of HPV increase your risk of cervical cancer. Testing for HPV may also be done on women of any age with unclear Pap test results.  Other health care providers may not recommend any screening for nonpregnant women who are considered low risk for pelvic cancer and who do not have symptoms. Ask your health care provider if a screening pelvic exam is right for   you.  If you have had past treatment for cervical cancer or a condition that could lead to cancer, you need Pap tests and screening for cancer for at least 20 years after your treatment. If Pap tests have been discontinued, your risk factors (such as having a new sexual partner) need to be reassessed to determine if screening should resume. Some women have medical problems that increase the chance of getting cervical cancer. In these cases, your health care provider may recommend more frequent screening and Pap tests.  Colorectal cancer can be detected and often prevented. Most routine colorectal cancer screening begins at the age of 50 years and continues through age 75 years. However, your health care provider may recommend screening at an earlier age if you have risk factors for colon cancer. On a yearly basis, your health care provider may provide home test kits to check  for hidden blood in the stool. Use of a small camera at the end of a tube, to directly examine the colon (sigmoidoscopy or colonoscopy), can detect the earliest forms of colorectal cancer. Talk to your health care provider about this at age 50, when routine screening begins. Direct exam of the colon should be repeated every 5-10 years through age 75 years, unless early forms of precancerous polyps or small growths are found.  People who are at an increased risk for hepatitis B should be screened for this virus. You are considered at high risk for hepatitis B if:  You were born in a country where hepatitis B occurs often. Talk with your health care provider about which countries are considered high risk.  Your parents were born in a high-risk country and you have not received a shot to protect against hepatitis B (hepatitis B vaccine).  You have HIV or AIDS.  You use needles to inject street drugs.  You live with, or have sex with, someone who has hepatitis B.  You get hemodialysis treatment.  You take certain medicines for conditions like cancer, organ transplantation, and autoimmune conditions.  Hepatitis C blood testing is recommended for all people born from 1945 through 1965 and any individual with known risks for hepatitis C.  Practice safe sex. Use condoms and avoid high-risk sexual practices to reduce the spread of sexually transmitted infections (STIs). STIs include gonorrhea, chlamydia, syphilis, trichomonas, herpes, HPV, and human immunodeficiency virus (HIV). Herpes, HIV, and HPV are viral illnesses that have no cure. They can result in disability, cancer, and death.  You should be screened for sexually transmitted illnesses (STIs) including gonorrhea and chlamydia if:  You are sexually active and are younger than 24 years.  You are older than 24 years and your health care provider tells you that you are at risk for this type of infection.  Your sexual activity has changed  since you were last screened and you are at an increased risk for chlamydia or gonorrhea. Ask your health care provider if you are at risk.  If you are at risk of being infected with HIV, it is recommended that you take a prescription medicine daily to prevent HIV infection. This is called preexposure prophylaxis (PrEP). You are considered at risk if:  You are sexually active and do not regularly use condoms or know the HIV status of your partner(s).  You take drugs by injection.  You are sexually active with a partner who has HIV.  Talk with your health care provider about whether you are at high risk of being infected with HIV. If   you choose to begin PrEP, you should first be tested for HIV. You should then be tested every 3 months for as long as you are taking PrEP.  Osteoporosis is a disease in which the bones lose minerals and strength with aging. This can result in serious bone fractures or breaks. The risk of osteoporosis can be identified using a bone density scan. Women ages 67 years and over and women at risk for fractures or osteoporosis should discuss screening with their health care providers. Ask your health care provider whether you should take a calcium supplement or vitamin D to reduce the rate of osteoporosis.  Menopause can be associated with physical symptoms and risks. Hormone replacement therapy is available to decrease symptoms and risks. You should talk to your health care provider about whether hormone replacement therapy is right for you.  Use sunscreen. Apply sunscreen liberally and repeatedly throughout the day. You should seek shade when your shadow is shorter than you. Protect yourself by wearing long sleeves, pants, a wide-brimmed hat, and sunglasses year round, whenever you are outdoors.  Once a month, do a whole body skin exam, using a mirror to look at the skin on your back. Tell your health care provider of new moles, moles that have irregular borders, moles that  are larger than a pencil eraser, or moles that have changed in shape or color.  Stay current with required vaccines (immunizations).  Influenza vaccine. All adults should be immunized every year.  Tetanus, diphtheria, and acellular pertussis (Td, Tdap) vaccine. Pregnant women should receive 1 dose of Tdap vaccine during each pregnancy. The dose should be obtained regardless of the length of time since the last dose. Immunization is preferred during the 27th-36th week of gestation. An adult who has not previously received Tdap or who does not know her vaccine status should receive 1 dose of Tdap. This initial dose should be followed by tetanus and diphtheria toxoids (Td) booster doses every 10 years. Adults with an unknown or incomplete history of completing a 3-dose immunization series with Td-containing vaccines should begin or complete a primary immunization series including a Tdap dose. Adults should receive a Td booster every 10 years.  Varicella vaccine. An adult without evidence of immunity to varicella should receive 2 doses or a second dose if she has previously received 1 dose. Pregnant females who do not have evidence of immunity should receive the first dose after pregnancy. This first dose should be obtained before leaving the health care facility. The second dose should be obtained 4-8 weeks after the first dose.  Human papillomavirus (HPV) vaccine. Females aged 13-26 years who have not received the vaccine previously should obtain the 3-dose series. The vaccine is not recommended for use in pregnant females. However, pregnancy testing is not needed before receiving a dose. If a female is found to be pregnant after receiving a dose, no treatment is needed. In that case, the remaining doses should be delayed until after the pregnancy. Immunization is recommended for any person with an immunocompromised condition through the age of 61 years if she did not get any or all doses earlier. During the  3-dose series, the second dose should be obtained 4-8 weeks after the first dose. The third dose should be obtained 24 weeks after the first dose and 16 weeks after the second dose.  Zoster vaccine. One dose is recommended for adults aged 30 years or older unless certain conditions are present.  Measles, mumps, and rubella (MMR) vaccine. Adults born  before 1957 generally are considered immune to measles and mumps. Adults born in 1957 or later should have 1 or more doses of MMR vaccine unless there is a contraindication to the vaccine or there is laboratory evidence of immunity to each of the three diseases. A routine second dose of MMR vaccine should be obtained at least 28 days after the first dose for students attending postsecondary schools, health care workers, or international travelers. People who received inactivated measles vaccine or an unknown type of measles vaccine during 1963-1967 should receive 2 doses of MMR vaccine. People who received inactivated mumps vaccine or an unknown type of mumps vaccine before 1979 and are at high risk for mumps infection should consider immunization with 2 doses of MMR vaccine. For females of childbearing age, rubella immunity should be determined. If there is no evidence of immunity, females who are not pregnant should be vaccinated. If there is no evidence of immunity, females who are pregnant should delay immunization until after pregnancy. Unvaccinated health care workers born before 1957 who lack laboratory evidence of measles, mumps, or rubella immunity or laboratory confirmation of disease should consider measles and mumps immunization with 2 doses of MMR vaccine or rubella immunization with 1 dose of MMR vaccine.  Pneumococcal 13-valent conjugate (PCV13) vaccine. When indicated, a person who is uncertain of his immunization history and has no record of immunization should receive the PCV13 vaccine. All adults 65 years of age and older should receive this  vaccine. An adult aged 19 years or older who has certain medical conditions and has not been previously immunized should receive 1 dose of PCV13 vaccine. This PCV13 should be followed with a dose of pneumococcal polysaccharide (PPSV23) vaccine. Adults who are at high risk for pneumococcal disease should obtain the PPSV23 vaccine at least 8 weeks after the dose of PCV13 vaccine. Adults older than 62 years of age who have normal immune system function should obtain the PPSV23 vaccine dose at least 1 year after the dose of PCV13 vaccine.  Pneumococcal polysaccharide (PPSV23) vaccine. When PCV13 is also indicated, PCV13 should be obtained first. All adults aged 65 years and older should be immunized. An adult younger than age 65 years who has certain medical conditions should be immunized. Any person who resides in a nursing home or long-term care facility should be immunized. An adult smoker should be immunized. People with an immunocompromised condition and certain other conditions should receive both PCV13 and PPSV23 vaccines. People with human immunodeficiency virus (HIV) infection should be immunized as soon as possible after diagnosis. Immunization during chemotherapy or radiation therapy should be avoided. Routine use of PPSV23 vaccine is not recommended for American Indians, [place] Natives, or people younger than 65 years unless there are medical conditions that require PPSV23 vaccine. When indicated, people who have unknown immunization and have no record of immunization should receive PPSV23 vaccine. One-time revaccination 5 years after the first dose of PPSV23 is recommended for people aged 19-64 years who have chronic kidney failure, nephrotic syndrome, asplenia, or immunocompromised conditions. People who received 1-2 doses of PPSV23 before age 65 years should receive another dose of PPSV23 vaccine at age 65 years or later if at least 5 years have passed since the previous dose. Doses of PPSV23 are not  needed for people immunized with PPSV23 at or after age 65 years.  Meningococcal vaccine. Adults with asplenia or persistent complement component deficiencies should receive 2 doses of quadrivalent meningococcal conjugate (MenACWY-D) vaccine. The doses should be obtained   at least 2 months apart. Microbiologists working with certain meningococcal bacteria, [place] recruits, people at risk during an outbreak, and people who travel to or live in countries with a high rate of meningitis should be immunized. A first-year college student up through age 34 years who is living in a residence hall should receive a dose if she did not receive a dose on or after her 16th birthday. Adults who have certain high-risk conditions should receive one or more doses of vaccine.  Hepatitis A vaccine. Adults who wish to be protected from this disease, have certain high-risk conditions, work with hepatitis A-infected animals, work in hepatitis A research labs, or travel to or work in countries with a high rate of hepatitis A should be immunized. Adults who were previously unvaccinated and who anticipate close contact with an international adoptee during the first 60 days after arrival in the [place] from a country with a high rate of hepatitis A should be immunized.  Hepatitis B vaccine. Adults who wish to be protected from this disease, have certain high-risk conditions, may be exposed to blood or other infectious body fluids, are household contacts or sex partners of hepatitis B positive people, are clients or workers in certain care facilities, or travel to or work in countries with a high rate of hepatitis B should be immunized.  Haemophilus influenzae type b (Hib) vaccine. A previously unvaccinated person with asplenia or sickle cell disease or having a scheduled splenectomy should receive 1 dose of Hib vaccine. Regardless of previous immunization, a recipient of a hematopoietic stem cell transplant should receive a  3-dose series 6-12 months after her successful transplant. Hib vaccine is not recommended for adults with HIV infection. Preventive Services / Frequency Ages 35 to 4 years  Blood pressure check.** / Every 3-5 years.  Lipid and cholesterol check.** / Every 5 years beginning at age 60.  Clinical breast exam.** / Every 3 years for women in their 71s and 10s.  BRCA-related cancer risk assessment.** / For women who have family members with a BRCA-related cancer (breast, ovarian, tubal, or peritoneal cancers).  Pap test.** / Every 2 years from ages 76 through 26. Every 3 years starting at age 61 through age 76 or 93 with a history of 3 consecutive normal Pap tests.  HPV screening.** / Every 3 years from ages 37 through ages 60 to 51 with a history of 3 consecutive normal Pap tests.  Hepatitis C blood test.** / For any individual with known risks for hepatitis C.  Skin self-exam. / Monthly.  Influenza vaccine. / Every year.  Tetanus, diphtheria, and acellular pertussis (Tdap, Td) vaccine.** / Consult your health care provider. Pregnant women should receive 1 dose of Tdap vaccine during each pregnancy. 1 dose of Td every 10 years.  Varicella vaccine.** / Consult your health care provider. Pregnant females who do not have evidence of immunity should receive the first dose after pregnancy.  HPV vaccine. / 3 doses over 6 months, if 93 and younger. The vaccine is not recommended for use in pregnant females. However, pregnancy testing is not needed before receiving a dose.  Measles, mumps, rubella (MMR) vaccine.** / You need at least 1 dose of MMR if you were born in 1957 or later. You may also need a 2nd dose. For females of childbearing age, rubella immunity should be determined. If there is no evidence of immunity, females who are not pregnant should be vaccinated. If there is no evidence of immunity, females who are  pregnant should delay immunization until after pregnancy.  Pneumococcal  13-valent conjugate (PCV13) vaccine.** / Consult your health care provider.  Pneumococcal polysaccharide (PPSV23) vaccine.** / 1 to 2 doses if you smoke cigarettes or if you have certain conditions.  Meningococcal vaccine.** / 1 dose if you are age 68 to 8 years and a Market researcher living in a residence hall, or have one of several medical conditions, you need to get vaccinated against meningococcal disease. You may also need additional booster doses.  Hepatitis A vaccine.** / Consult your health care provider.  Hepatitis B vaccine.** / Consult your health care provider.  Haemophilus influenzae type b (Hib) vaccine.** / Consult your health care provider. Ages 7 to 53 years  Blood pressure check.** / Every year.  Lipid and cholesterol check.** / Every 5 years beginning at age 25 years.  Lung cancer screening. / Every year if you are aged 11-80 years and have a 30-pack-year history of smoking and currently smoke or have quit within the past 15 years. Yearly screening is stopped once you have quit smoking for at least 15 years or develop a health problem that would prevent you from having lung cancer treatment.  Clinical breast exam.** / Every year after age 48 years.  BRCA-related cancer risk assessment.** / For women who have family members with a BRCA-related cancer (breast, ovarian, tubal, or peritoneal cancers).  Mammogram.** / Every year beginning at age 41 years and continuing for as long as you are in good health. Consult with your health care provider.  Pap test.** / Every 3 years starting at age 65 years through age 37 or 70 years with a history of 3 consecutive normal Pap tests.  HPV screening.** / Every 3 years from ages 72 years through ages 60 to 40 years with a history of 3 consecutive normal Pap tests.  Fecal occult blood test (FOBT) of stool. / Every year beginning at age 21 years and continuing until age 5 years. You may not need to do this test if you get  a colonoscopy every 10 years.  Flexible sigmoidoscopy or colonoscopy.** / Every 5 years for a flexible sigmoidoscopy or every 10 years for a colonoscopy beginning at age 35 years and continuing until age 48 years.  Hepatitis C blood test.** / For all people born from [birth year] through 1965 and any individual with known risks for hepatitis C.  Skin self-exam. / Monthly.  Influenza vaccine. / Every year.  Tetanus, diphtheria, and acellular pertussis (Tdap/Td) vaccine.** / Consult your health care provider. Pregnant women should receive 1 dose of Tdap vaccine during each pregnancy. 1 dose of Td every 10 years.  Varicella vaccine.** / Consult your health care provider. Pregnant females who do not have evidence of immunity should receive the first dose after pregnancy.  Zoster vaccine.** / 1 dose for adults aged 30 years or older.  Measles, mumps, rubella (MMR) vaccine.** / You need at least 1 dose of MMR if you were born in 1957 or later. You may also need a second dose. For females of childbearing age, rubella immunity should be determined. If there is no evidence of immunity, females who are not pregnant should be vaccinated. If there is no evidence of immunity, females who are pregnant should delay immunization until after pregnancy.  Pneumococcal 13-valent conjugate (PCV13) vaccine.** / Consult your health care provider.  Pneumococcal polysaccharide (PPSV23) vaccine.** / 1 to 2 doses if you smoke cigarettes or if you have certain conditions.  Meningococcal vaccine.** /  Consult your health care provider.  Hepatitis A vaccine.** / Consult your health care provider.  Hepatitis B vaccine.** / Consult your health care provider.  Haemophilus influenzae type b (Hib) vaccine.** / Consult your health care provider. Ages 64 years and over  Blood pressure check.** / Every year.  Lipid and cholesterol check.** / Every 5 years beginning at age 23 years.  Lung cancer screening. / Every year if you  are aged 16-80 years and have a 30-pack-year history of smoking and currently smoke or have quit within the past 15 years. Yearly screening is stopped once you have quit smoking for at least 15 years or develop a health problem that would prevent you from having lung cancer treatment.  Clinical breast exam.** / Every year after age 74 years.  BRCA-related cancer risk assessment.** / For women who have family members with a BRCA-related cancer (breast, ovarian, tubal, or peritoneal cancers).  Mammogram.** / Every year beginning at age 44 years and continuing for as long as you are in good health. Consult with your health care provider.  Pap test.** / Every 3 years starting at age 58 years through age 22 or 39 years with 3 consecutive normal Pap tests. Testing can be stopped between 65 and 70 years with 3 consecutive normal Pap tests and no abnormal Pap or HPV tests in the past 10 years.  HPV screening.** / Every 3 years from ages 64 years through ages 70 or 61 years with a history of 3 consecutive normal Pap tests. Testing can be stopped between 65 and 70 years with 3 consecutive normal Pap tests and no abnormal Pap or HPV tests in the past 10 years.  Fecal occult blood test (FOBT) of stool. / Every year beginning at age 40 years and continuing until age 27 years. You may not need to do this test if you get a colonoscopy every 10 years.  Flexible sigmoidoscopy or colonoscopy.** / Every 5 years for a flexible sigmoidoscopy or every 10 years for a colonoscopy beginning at age 7 years and continuing until age 32 years.  Hepatitis C blood test.** / For all people born from [birth year] through 1965 and any individual with known risks for hepatitis C.  Osteoporosis screening.** / A one-time screening for women ages 30 years and over and women at risk for fractures or osteoporosis.  Skin self-exam. / Monthly.  Influenza vaccine. / Every year.  Tetanus, diphtheria, and acellular pertussis (Tdap/Td)  vaccine.** / 1 dose of Td every 10 years.  Varicella vaccine.** / Consult your health care provider.  Zoster vaccine.** / 1 dose for adults aged 35 years or older.  Pneumococcal 13-valent conjugate (PCV13) vaccine.** / Consult your health care provider.  Pneumococcal polysaccharide (PPSV23) vaccine.** / 1 dose for all adults aged 46 years and older.  Meningococcal vaccine.** / Consult your health care provider.  Hepatitis A vaccine.** / Consult your health care provider.  Hepatitis B vaccine.** / Consult your health care provider.  Haemophilus influenzae type b (Hib) vaccine.** / Consult your health care provider. ** Family history and personal history of risk and conditions may change your health care provider's recommendations.   This information is not intended to replace advice given to you by your health care provider. Make sure you discuss any questions you have with your health care provider.   Document Released: 06/05/2001 Document Revised: 04/30/2014 Document Reviewed: 09/04/2010 Elsevier Interactive Patient Education [employer].

## 2015-07-26 NOTE — Progress Notes (Signed)
Subjective:     [name] [name] is a 60 y.o. female and is here for a comprehensive physical exam. The patient reports problems - back pain --low back -- has seen Dr Vertell Limber.  Social History   Social History  . Marital Status: Single    Spouse Name: N/A  . Number of Children: 1  . Years of Education: N/A   Occupational History  . Market researcher    Social History Main Topics  . Smoking status: Current Every Day Smoker -- 1.00 packs/day for 42 years    Types: Cigarettes  . Smokeless tobacco: Never Used  . Alcohol Use: 0.0 oz/week    0 Standard drinks or equivalent per week     Comment: RARE 1-2 drinks year  . Drug Use: No  . Sexual Activity: Not on file   Other Topics Concern  . Not on file   Social History Narrative   Single   Adopted daughter   Dog         Health Maintenance  Topic Date Due  . Hepatitis C Screening  17-Dec-1955  . HIV Screening  11/17/1968  . ZOSTAVAX  11/17/2013  . INFLUENZA VACCINE  11/22/2015  . MAMMOGRAM  02/10/2017  . TETANUS/TDAP  06/06/2023  . COLONOSCOPY  02/24/2025    The following portions of the patient's history were reviewed and updated as appropriate:  She  has a past medical history of Carcinoid tumor of abdomen; Benign tumor; Family history of adverse reaction to anesthesia; Pneumothorax on left; Pneumothorax on right; Allergy; Cataract; and Lower back pain. She  does not have any pertinent problems on file. She  has past surgical history that includes Lung surgery (2008?); Oophorectomy; Whipple procedure; LASIK; Tonsillectomy; Pars plana vitrectomy (Left, 08/16/2014); Membrane peel (Left, 08/16/2014); Colonoscopy (2006); Cholecystectomy; and Abdominal hysterectomy (04/23/1997). Her family history includes Cancer in her father, mother, and sister; Diabetes in her brother; Heart disease in her father and sister. There is no history of Colon cancer. She  reports that she has been smoking Cigarettes.  She has a 42 pack-year smoking history.  She has never used smokeless tobacco. She reports that she drinks alcohol. She reports that she does not use illicit drugs. She has a current medication list which includes the following prescription(s): calcium carbonate-vitamin d, cetirizine, vitamin d3, fluticasone, and multivitamins ther. w/minerals. Current Outpatient Prescriptions on File Prior to Visit  Medication Sig Dispense Refill  . Calcium Carbonate-Vitamin D (CALTRATE 600+D PO) Take 2 tablets by mouth daily.     . cetirizine (ZYRTEC) 10 MG tablet Take 10 mg by mouth daily.    . Cholecalciferol (VITAMIN D3) 1000 UNITS CAPS Take 1 capsule by mouth daily.    . fluticasone (FLONASE) 50 MCG/ACT nasal spray Place 2 sprays into both nostrils daily. 16 g 0  . Multiple Vitamins-Minerals (MULTIVITAMINS THER. W/MINERALS) TABS Take 1 tablet by mouth daily.    . [DISCONTINUED] zolpidem (AMBIEN) 5 MG tablet Take 5 mg by mouth at bedtime as needed.       No current facility-administered medications on file prior to visit.   She has No Known Allergies..  Review of Systems Review of Systems  Constitutional: Negative for activity change, appetite change and fatigue.  HENT: Negative for hearing loss, congestion, tinnitus and ear discharge.  dentist [id number]: Negative for visual disturbance (see optho q1y -- vision corrected to 20/20 with glasses).  Respiratory: Negative for cough, chest tightness and shortness of breath.   Cardiovascular: Negative for chest pain,  palpitations and leg swelling.  Gastrointestinal: Negative for abdominal pain, diarrhea, constipation and abdominal distention.  Genitourinary: Negative for urgency, frequency, decreased urine volume and difficulty urinating.  Musculoskeletal: Negative for back pain, arthralgias and gait problem.  Skin: Negative for color change, pallor and rash.  Neurological: Negative for dizziness, light-headedness, numbness and headaches.  Hematological: Negative for adenopathy. Does not  bruise/bleed easily.  Psychiatric/Behavioral: Negative for suicidal ideas, confusion, sleep disturbance, self-injury, dysphoric mood, decreased concentration and agitation.      Objective:    BP 122/70 mmHg  Pulse 96  Temp(Src) 98.2 F (36.8 C) (Oral)  Ht '5\' 3"'$  (1.6 m)  Wt 120 lb 3.2 oz (54.522 kg)  BMI 21.30 kg/m2  SpO2 98% General appearance: alert, cooperative, appears stated age and no distress Head: Normocephalic, without obvious abnormality, atraumatic Eyes: conjunctivae/corneas clear. PERRL, EOM's intact. Fundi benign. Ears: normal TM's and external ear canals both ears Nose: Nares normal. Septum midline. Mucosa normal. No drainage or sinus tenderness. Throat: lips, mucosa, and tongue normal; teeth and gums normal Neck: no adenopathy, no carotid bruit, no JVD, supple, symmetrical, trachea midline and thyroid not enlarged, symmetric, no tenderness/mass/nodules Back: symmetric, no curvature. ROM normal. No CVA tenderness. Lungs: clear to auscultation bilaterally Breasts: normal appearance, no masses or tenderness Heart: regular rate and rhythm, S1, S2 normal, no murmur, click, rub or gallop Abdomen: soft, non-tender; bowel sounds normal; no masses,  no organomegaly Pelvic: not indicated; status post hysterectomy, negative ROS Extremities: extremities normal, atraumatic, no cyanosis or edema Pulses: 2+ and symmetric Skin: Skin color, texture, turgor normal. No rashes or lesions Lymph nodes: Cervical, supraclavicular, and axillary nodes normal. Neurologic: Alert and oriented X 3, normal strength and tone. Normal symmetric reflexes. Normal coordination and gait Psych- no depression, no anxiety      Assessment:    Healthy female exam.     Plan:    ghm utd Check labs See After Visit Summary for Counseling Recommendations    1. Tobacco use disorder   - Ambulatory Referral for Lung Cancer Scre - DG Chest 2 View; Future  2. Cough   - DG Chest 2 View; Future  3.  Preventative health care   - TSH - POCT urinalysis dipstick - Lipid panel - CBC with Differential/Platelet - Comprehensive metabolic panel - Hepatitis C antibody  4. Encounter for screening for HIV   - HIV antibody  5. Need for hepatitis C screening test   - Hepatitis C antibody  6. Osteopenia   - DG Bone Density; Future - Vitamin D (25 hydroxy)  7. Need for pneumococcal vaccination   - Pneumococcal polysaccharide vaccine 23-valent greater than or equal to 2yo subcutaneous/IM  8. Need for shingles vaccine   - Varicella-zoster vaccine subcutaneous

## 2015-07-26 NOTE — Progress Notes (Signed)
Pre visit review using our clinic review tool, if applicable. No additional management support is needed unless otherwise documented below in the visit note. 

## 2015-07-29 ENCOUNTER — Institutional Professional Consult (permissible substitution): Payer: BLUE CROSS/BLUE SHIELD | Admitting: Pulmonary Disease

## 2015-08-02 ENCOUNTER — Encounter: Payer: Self-pay | Admitting: Pulmonary Disease

## 2015-08-02 ENCOUNTER — Ambulatory Visit (INDEPENDENT_AMBULATORY_CARE_PROVIDER_SITE_OTHER): Payer: BLUE CROSS/BLUE SHIELD | Admitting: Pulmonary Disease

## 2015-08-02 VITALS — BP 160/78 | HR 98 | Ht 63.0 in | Wt 120.0 lb

## 2015-08-02 DIAGNOSIS — R0609 Other forms of dyspnea: Secondary | ICD-10-CM

## 2015-08-02 DIAGNOSIS — Q8501 Neurofibromatosis, type 1: Secondary | ICD-10-CM

## 2015-08-02 DIAGNOSIS — R059 Cough, unspecified: Secondary | ICD-10-CM | POA: Insufficient documentation

## 2015-08-02 DIAGNOSIS — Z720 Tobacco use: Secondary | ICD-10-CM | POA: Insufficient documentation

## 2015-08-02 DIAGNOSIS — K219 Gastro-esophageal reflux disease without esophagitis: Secondary | ICD-10-CM

## 2015-08-02 DIAGNOSIS — J432 Centrilobular emphysema: Secondary | ICD-10-CM

## 2015-08-02 DIAGNOSIS — R05 Cough: Secondary | ICD-10-CM

## 2015-08-02 DIAGNOSIS — R0600 Dyspnea, unspecified: Secondary | ICD-10-CM | POA: Insufficient documentation

## 2015-08-02 DIAGNOSIS — J439 Emphysema, unspecified: Secondary | ICD-10-CM | POA: Insufficient documentation

## 2015-08-02 MED ORDER — BUDESONIDE 0.5 MG/2ML IN SUSP: 0.5000 mg | Freq: Two times a day (BID) | RESPIRATORY_TRACT | Status: DC

## 2015-08-02 MED ORDER — IPRATROPIUM-ALBUTEROL 0.5-2.5 (3) MG/3ML IN SOLN: RESPIRATORY_TRACT | Status: DC

## 2015-08-02 MED ORDER — OMEPRAZOLE 40 MG PO CPDR: 40.0000 mg | DELAYED_RELEASE_CAPSULE | Freq: Every day | ORAL | Status: DC

## 2015-08-02 NOTE — Assessment & Plan Note (Signed)
Likely related to underlying lung dse. May need echo on f/u if not better.

## 2015-08-02 NOTE — Assessment & Plan Note (Signed)
Smoking cessation done.  

## 2015-08-02 NOTE — Patient Instructions (Signed)
1. We will schedule you for a chest CT scan, no contrast. 2.  We will start you with Duoneb 4x/d and every 4 hrs as needed. Start Pulmicort 0.5 mg 2x/day.  3. Continue Flonase 2 squirts/nostril at bedtime. 4. Continue zyrtec. 5. Start Delsym 10 ml (2 tsp) 2x/day -- stop after a week if no change 6. Start Prilosec 40 mg/day.   Return to clinic in 2 mos.

## 2015-08-02 NOTE — Progress Notes (Signed)
Subjective:    Patient ID: [name] [name], female    DOB: 18-Mar-1956, 60 y.o.   MRN: [id number]  HPI   This is the case of [name] [name], 60 y.o. Female, who was referred by Dr. Roma Schanz  in consultation regarding abnormal CXR/COPD.   As you very well know, patient 32 PY smoking history, still smokes 1PPD.  Pt has congenital NF1. She does not see anyone in particular for her NF1.  Her cysts have been increasing in number through the years.   Was dxed with asthmatic bronchitis as a child. Outgrew it.  In 2003,she acutely became SOB. L lung was noted to be collapsed. Had a L chest tube. She had surgery for L PTX and ended up having PTX on R. Had a chest tube in the R. Ended up having surgery in R as well. Was told she had numerous cysts.  Had suregry in [facility]. Did NOT see pulm. No issues until 8 mos ago.  8 mos ago with more SOB with ADLs. Also with a dry cough. Some worse.  Has UACS > placed on Flonase. Cough not significantly beter. Takes zyrtec daily.   Not on inhalers.   Has GERD. Takes Zantac prn.    Review of Systems  Constitutional: Negative.  Negative for fever and unexpected weight change.  HENT: Positive for congestion, postnasal drip, sinus pressure, sore throat and trouble swallowing. Negative for dental problem, ear pain, nosebleeds, rhinorrhea and sneezing.   Eyes: Positive for visual disturbance. Negative for redness and itching.  Respiratory: Positive for cough, shortness of breath and wheezing.   Cardiovascular: Positive for palpitations. Negative for leg swelling.  Gastrointestinal: Negative.  Negative for nausea and vomiting.  Endocrine: Negative.   Genitourinary: Negative.  Negative for dysuria.  Musculoskeletal: Positive for back pain and arthralgias. Negative for joint swelling.  Skin: Negative.  Negative for rash.  Allergic/Immunologic: Positive for environmental allergies.  Neurological: Positive for headaches.  Hematological: Bruises/bleeds easily.    Psychiatric/Behavioral: Negative.  Negative for dysphoric mood. The patient is not nervous/anxious.    Past Medical History  Diagnosis Date  . Carcinoid tumor of abdomen   . 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 Family history of adverse reaction to anesthesia     Mother had trouble waking up  . Pneumothorax on left   . Pneumothorax on right   . Allergy   . Cataract     both eyes  . Lower back pain     In remission for CA. (-) DVT.  Family History  Problem Relation Age of Onset  . Cancer Mother   . Cancer Father   . Heart disease Father   . Cancer Sister   . Heart disease Sister   . Diabetes Brother   . Colon cancer Neg Hx      Past Surgical History  Procedure Laterality Date  . Lung surgery  2008?    d/t pneumothroax  . Oophorectomy    . Whipple procedure    . Lasik    . Tonsillectomy    . Pars plana vitrectomy Left 08/16/2014    Procedure: PARS PLANA VITRECTOMY WITH 25 GAUGE;  Surgeon: Sherlynn Stalls, MD;  Location: [place];  Service: Ophthalmology;  Laterality: Left;  Marland Kitchen Membrane peel Left 08/16/2014    Procedure: MEMBRANE PEEL LEFT EYE ;  Surgeon: Sherlynn Stalls, MD;  Location: [place];  Service: Ophthalmology;  Laterality: Left;  . Colonoscopy  2006  . Cholecystectomy    .  Abdominal hysterectomy  04/23/1997    fibroids  TAH/ BSO    Social History   Social History  . Marital Status: Single    Spouse Name: N/A  . Number of Children: 1  . Years of Education: N/A   Occupational History  .      disabled   Social History Main Topics  . Smoking status: Current Every Day Smoker -- 1.00 packs/day for 47 years    Types: Cigarettes  . Smokeless tobacco: Never Used  . Alcohol Use: 0.0 oz/week    0 Standard drinks or equivalent per week     Comment: RARE 1-2 drinks year  . Drug Use: No  . Sexual Activity: No   Other Topics Concern  . Not on file   Social History Narrative   Single   Adopted daughter   Dog         Lives in [place]. Worked as as a Herbalist person but  unemployed.   No Known Allergies   Outpatient Prescriptions Prior to Visit  Medication Sig Dispense Refill  . Calcium Carbonate-Vitamin D (CALTRATE 600+D PO) Take 2 tablets by mouth daily.     . cetirizine (ZYRTEC) 10 MG tablet Take 10 mg by mouth daily.    . Cholecalciferol (VITAMIN D3) 1000 UNITS CAPS Take 1 capsule by mouth daily.    . fluticasone (FLONASE) 50 MCG/ACT nasal spray Place 2 sprays into both nostrils daily. 16 g 0  . Multiple Vitamins-Minerals (MULTIVITAMINS THER. W/MINERALS) TABS Take 1 tablet by mouth daily.     No facility-administered medications prior to visit.   Meds ordered this encounter  Medications  . ipratropium-albuterol (DUONEB) 0.5-2.5 (3) MG/3ML SOLN    Sig: EVERY 4 HOURS AND AS NEEDED    Dispense:  360 mL    Refill:  5    DX: Q85.01  . budesonide (PULMICORT) 0.5 MG/2ML nebulizer solution    Sig: Take 2 mLs (0.5 mg total) by nebulization 2 (two) times daily.    Dispense:  120 mL    Refill:  12    DX: Q85.01  . omeprazole (PRILOSEC) 40 MG capsule    Sig: Take 1 capsule (40 mg total) by mouth daily.    Dispense:  30 capsule    Refill:  5            Objective:   Physical Exam  Vitals:  Filed Vitals:   08/02/15 1118  BP: 160/78  Pulse: 98  Height: '5\' 3"'$  (1.6 m)  Weight: 120 lb (54.432 kg)  SpO2: 96%    Constitutional/General:  Pleasant, well-nourished, well-developed, not in any distress,  Comfortably seating.  Well kempt  Body mass index is 21.26 kg/(m^2). Wt Readings from Last 3 Encounters:  08/02/15 120 lb (54.432 kg)  07/26/15 120 lb 3.2 oz (54.522 kg)  02/25/15 114 lb (51.71 kg)    HEENT: Pupils equal and reactive to light and accommodation. Anicteric sclerae. Normal nasal mucosa.   No oral  lesions,  mouth clear,  oropharynx clear, no postnasal drip. (-) Oral thrush. No dental caries.  Airway - Mallampati class III Pt with cysts in her face, neck, chest, abd, extremities.   Neck: No masses. Midline trachea. No JVD,  (-) LAD. (-) bruits appreciated.  Respiratory/Chest: Grossly normal chest. (-) deformity. (-) Accessory muscle use.  Symmetric expansion. (-) Tenderness on palpation.  Resonant on percussion.  Diminished BS on both lower lung zones. (-) wheezing, crackles, rhonchi (-) egophony  Cardiovascular: Regular rate and  rhythm, heart sounds normal, no murmur or gallops, no peripheral edema  Gastrointestinal:  Normal bowel sounds. Soft, non-tender. No hepatosplenomegaly.  (-) masses.   Musculoskeletal:  Normal muscle tone. Normal gait.   Extremities: Grossly normal. (-) clubbing, cyanosis.  (-) edema  Skin: (-) rash,lesions seen.   Neurological/Psychiatric : alert, oriented to time, place, person. Normal mood and affect            Assessment & Plan:  Neurofibromatosis, type 1 (von Recklinghausen's disease) ([place]) Patient has neurofibromatosis, type I, congenital. Her cysts have been increasing through the years. She does not see anyone in particular regarding this. She had bilateral pneumothorax in 2003 requiring chest tube in surgery. Reason dyspnea the last year related to cough as well. Patient continues to smoke. Chest x-ray (April/2017) bilateral upper cystic disease. 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 Of course, with her smoking, it does not help. Plan : 1. Chest CT scan, no contrast to better delineate the parenchyma. 2. May need follow-up with regards to her NF1 at [facility]. She was contemplating going to [facility] [place] but it never happened. 3. See COPD plan 4. Avoid straining. Plan to suppress cough. 5. Told pt to call 911 stat if with acute SOB, worsening SOB.   COPD (chronic obstructive pulmonary disease) with emphysema ([place]) Patient with cystic lung disease associated with NF1. 45 pack year smoking history, continues to smoke a pack a day. Likely with COPD on top of underlying cystic dse. Plan: 1. Personally,  I would avoid MDIs so she does not strain and cause pneumothorax. 2. Start DuoNeb 4 times a day and every 4 hours as needed. 3. Start Pulmicort neb 0.5, twice a day. 4. Chest CT scan. 5. Smoking cessation. 6. Needs to be up-to-date with vaccine. 7. We'll need alpha-1, ABG on follow-up.  Cough Cough less than a year. Could be CB. Continues to smoke. Also sinus issues and reflux disease. Plan : 1. Continue Flonase, 2 squirts per nostril at bedtime. 2. Continue Zyrtec, 10 mg daily. 3. Start Delsym, 10 ML's, twice a day. DC if no effect. 4. Rx reflux.  GERD (gastroesophageal reflux disease) Switch zantac to prilosec, 40 mg at hs.   Tobacco user Smoking cessation done  Exertional dyspnea Likely related to underlying lung dse. May need echo on f/u if not better.      Thank you very much for letting me participate in this patient's care. Please do not hesitate to give me a call if you have any questions or concerns regarding the treatment plan.   Patient will follow up with me in 2 mos   I personally reviewed previous images (Chest Xray, Chest Ct scan) done on this patient. I reviewed the reports on the images as well.     Monica Becton, MD 08/02/2015   12:26 PM Pulmonary and [place] Pager: [phone] Office: [phone], Fax: [phone]

## 2015-08-02 NOTE — Assessment & Plan Note (Signed)
Patient has neurofibromatosis, type I, congenital. Her cysts have been increasing through the years. She does not see anyone in particular regarding this. She had bilateral pneumothorax in 2003 requiring chest tube in surgery. Reason dyspnea the last year related to cough as well. Patient continues to smoke. Chest x-ray (April/2017) bilateral upper cystic disease. 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 Of course, with her smoking, it does not help. Plan : 1. Chest CT scan, no contrast to better delineate the parenchyma. 2. May need follow-up with regards to her NF1 at [facility]. She was contemplating going to [facility] but it never happened. 3. See COPD plan 4. Avoid straining. Plan to suppress cough. 5. Told pt to call 911 stat if with acute SOB, worsening SOB.

## 2015-08-02 NOTE — Addendum Note (Signed)
Addended by: Wynn Banker H on: 08/02/2015 04:11 PM   Modules accepted: Orders, Medications

## 2015-08-02 NOTE — Assessment & Plan Note (Signed)
Switch zantac to prilosec, 40 mg at hs.

## 2015-08-02 NOTE — Addendum Note (Signed)
Addended by: Wynn Banker H on: 08/02/2015 04:05 PM   Modules accepted: Orders, Medications

## 2015-08-02 NOTE — Assessment & Plan Note (Signed)
Patient with cystic lung disease associated with NF1. 45 pack year smoking history, continues to smoke a pack a day. Likely with COPD on top of underlying cystic dse. Plan: 1. Personally, I would avoid MDIs so she does not strain and cause pneumothorax. 2. Start DuoNeb 4 times a day and every 4 hours as needed. 3. Start Pulmicort neb 0.5, twice a day. 4. Chest CT scan. 5. Smoking cessation. 6. Needs to be up-to-date with vaccine. 7. We'll need alpha-1, ABG on follow-up.

## 2015-08-02 NOTE — Assessment & Plan Note (Signed)
Cough less than a year. Could be CB. Continues to smoke. Also sinus issues and reflux disease. Plan : 1. Continue Flonase, 2 squirts per nostril at bedtime. 2. Continue Zyrtec, 10 mg daily. 3. Start Delsym, 10 ML's, twice a day. DC if no effect. 4. Rx reflux.

## 2015-08-03 ENCOUNTER — Telehealth: Payer: Self-pay | Admitting: Pulmonary Disease

## 2015-08-03 NOTE — Telephone Encounter (Signed)
Spoke with [name] [name] at [facility]. The diagnosis code that was on the rx will not be covered. A new diagnosis code was provided as well as the pt's insurance information. Nothing further was needed at this time.

## 2015-08-04 ENCOUNTER — Encounter: Payer: Self-pay | Admitting: *Deleted

## 2015-08-04 ENCOUNTER — Telehealth: Payer: Self-pay | Admitting: *Deleted

## 2015-08-04 ENCOUNTER — Ambulatory Visit (INDEPENDENT_AMBULATORY_CARE_PROVIDER_SITE_OTHER)
Admission: RE | Admit: 2015-08-04 | Discharge: 2015-08-04 | Disposition: A | Discharge status: Home / Self Care | Payer: BLUE CROSS/BLUE SHIELD | Source: Ambulatory Visit | Attending: Pulmonary Disease | Admitting: Pulmonary Disease

## 2015-08-04 DIAGNOSIS — Q8501 Neurofibromatosis, type 1: Secondary | ICD-10-CM

## 2015-08-04 NOTE — Telephone Encounter (Signed)
-----   Message from [name], DO sent at 07/31/2015  4:53 PM EDT ----- Wbc elevated but this is not new.   Cholesterol--- LDL goal < 100,  HDL >40,  TG < 150.  Diet and exercise will increase HDL and decrease LDL and TG.  Fish,  Fish Oil, Flaxseed oil will also help increase the HDL and decrease Triglycerides.   Recheck labs in 6 months Lipid, cmp, cbcd.

## 2015-08-08 ENCOUNTER — Telehealth: Payer: Self-pay | Admitting: Pulmonary Disease

## 2015-08-08 DIAGNOSIS — J432 Centrilobular emphysema: Secondary | ICD-10-CM

## 2015-08-08 NOTE — Telephone Encounter (Signed)
Pt aware the Dr Corrie Dandy is out of office until 4/19 - will send message regarding CT results. Please advise Dr Corrie Dandy. Thanks.   Pt states that she received a call from [facility], they do not service her insurance and the order for nebulizer machine and supplies needs to go to another supplier within her network. Will send to [facility] to advise.

## 2015-08-09 NOTE — Telephone Encounter (Signed)
Pt has [employer] which [facility] does participate with. I called Melissa at [facility] & left her vm to check on this for me. Will await Melissa's call.

## 2015-08-09 NOTE — Telephone Encounter (Signed)
Melissa from [facility] called me back & left vm. She states this order came in last week while she was on vacation. She doesn't see where it was ever pulled & she doesn't see where anyone called the pt. She states he could have been someone from the pharmacy??  She is pulling the order today to have it processed & states she will let us know if there is a problem with it. I called the pt & told her [facility] is going to process the order for the nebulizer & they will let us know if there is a problem & not sure who called her.  She states she will call her pharmacy about getting her meds filled for the nebulizer.  Will forward this message to Dr Corrie Dandy as per Ashley's note pt is requesting CT results.

## 2015-08-10 ENCOUNTER — Ambulatory Visit
Admission: RE | Admit: 2015-08-10 | Discharge: 2015-08-10 | Disposition: A | Discharge status: Home / Self Care | Payer: BLUE CROSS/BLUE SHIELD | Source: Ambulatory Visit | Attending: Family Medicine | Admitting: Family Medicine

## 2015-08-10 DIAGNOSIS — M8580 Other specified disorders of bone density and structure, unspecified site: Secondary | ICD-10-CM

## 2015-08-10 NOTE — Telephone Encounter (Signed)
Patient notified of Dr. Corrie Dandy recommendations. Order entered. Patient scheduled for Spirometry on 08/25/15 at 11:30.  Patient aware of appointment. Nothing further needed.

## 2015-08-10 NOTE — Telephone Encounter (Signed)
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  She has some mild scarring at the bases.  There is an incidental L adrenal mass 2.4 cm, likely adrenal adenoma per radiologist report.  We can discuss the adenoma on f/u. Thanks.  AD

## 2015-08-10 NOTE — Telephone Encounter (Signed)
Spoke with the pt and notified of results per AD  She verbalized understanding She states she is going to apply for disability and so needing to know what stage her COPD is  Please advise, thanks!

## 2015-08-10 NOTE — Telephone Encounter (Signed)
Can we do a spirometry at the office? Just spiro. No lung volumes or DLCO. Thanks.  AD

## 2015-08-12 ENCOUNTER — Encounter: Payer: Self-pay | Admitting: Family Medicine

## 2015-08-16 ENCOUNTER — Encounter: Payer: Self-pay | Admitting: Family Medicine

## 2015-08-16 MED ORDER — ALENDRONATE SODIUM 70 MG PO TABS: 70.0000 mg | ORAL_TABLET | ORAL | Status: DC

## 2015-08-17 ENCOUNTER — Encounter: Payer: Self-pay | Admitting: Pulmonary Disease

## 2015-08-17 NOTE — Telephone Encounter (Signed)
Letter was mailed to pt on 08/04/15.

## 2015-08-17 NOTE — Telephone Encounter (Signed)
E-mail from patient: Message     I will be in next week for that spirometry test if I think I will need it I will let you know   Will forward to AD to make him aware

## 2015-08-17 NOTE — Telephone Encounter (Signed)
From Dr Corrie Dandy: Message     The cough could be related to her underlying copd, GERD, sinus issues.     Is she on the neb meds?     Can she wait until f/u with regards to cough? Or does she need something for it now?     Thanks!    E-mail from patient: Message     It has help but there are time that I struggle with breathing.     Like doing lots stairs, walking around lots [employer] - grocery store

## 2015-08-17 NOTE — Telephone Encounter (Signed)
Message     I have stop taking Delsym as instructed as there has been no change in my cough.        Thanks    [name] [name]      Per 4.11.17 ov w/ AD: Patient Instructions       1. We will schedule you for a chest CT scan, no contrast. 2.  We will start you with Duoneb 4x/d and every 4 hrs as needed. Start Pulmicort 0.5 mg 2x/day.   3. Continue Flonase 2 squirts/nostril at bedtime. 4. Continue zyrtec. 5. Start Delsym 10 ml (2 tsp) 2x/day -- stop after a week if no change 6. Start Prilosec 40 mg/day.   Return to clinic in 2 mos   E-mail sent to patient informing her that the e-mail will be sent to AD as FYI and asking pt if she has noticed any improvement with her other recs from the visit.

## 2015-08-18 ENCOUNTER — Telehealth: Payer: Self-pay | Admitting: Pulmonary Disease

## 2015-08-18 DIAGNOSIS — F17210 Nicotine dependence, cigarettes, uncomplicated: Secondary | ICD-10-CM

## 2015-08-18 NOTE — Telephone Encounter (Signed)
Pt returning call regarding lung ca screening

## 2015-08-24 NOTE — Telephone Encounter (Signed)
Called spoke with pt. Reviewed lung cancer screening protocol and pt qualify's for the program. I scheduled her for a [facility] 09/05/15 with SG. CT order placed. Pt voiced understanding and had no further questions. Noting further needed.

## 2015-08-25 ENCOUNTER — Ambulatory Visit (INDEPENDENT_AMBULATORY_CARE_PROVIDER_SITE_OTHER): Payer: BLUE CROSS/BLUE SHIELD | Admitting: Pulmonary Disease

## 2015-08-25 DIAGNOSIS — J432 Centrilobular emphysema: Secondary | ICD-10-CM

## 2015-08-25 LAB — PULMONARY FUNCTION TEST
FEF 25-75 Post: 0.93 L/sec
FEF 25-75 Pre: 1.08 L/sec
FEF2575-%Change-Post: -13 %
FEF2575-%Pred-Post: 42 %
FEF2575-%Pred-Pre: 49 %
FEV1-%Change-Post: -3 %
FEV1-%Pred-Post: 74 %
FEV1-%Pred-Pre: 76 %
FEV1-Post: 1.76 L
FEV1-Pre: 1.82 L
FEV1FVC-%Change-Post: 5 %
FEV1FVC-%Pred-Pre: 80 %
FEV6-%Change-Post: -8 %
FEV6-%Pred-Post: 89 %
FEV6-%Pred-Pre: 97 %
FEV6-Post: 2.63 L
FEV6-Pre: 2.88 L
FEV6FVC-%Pred-Post: 104 %
FEV6FVC-%Pred-Pre: 104 %
FVC-%Change-Post: -8 %
FVC-%Pred-Post: 85 %
FVC-%Pred-Pre: 93 %
FVC-Post: 2.63 L
FVC-Pre: 2.88 L
Post FEV1/FVC ratio: 67 %
Post FEV6/FVC ratio: 100 %
Pre FEV1/FVC ratio: 63 %
Pre FEV6/FVC Ratio: 100 %

## 2015-08-25 NOTE — Progress Notes (Signed)
Spirometry pre and post done today. 

## 2015-08-31 ENCOUNTER — Encounter: Payer: Self-pay | Admitting: Pulmonary Disease

## 2015-08-31 ENCOUNTER — Encounter: Payer: Self-pay | Admitting: Acute Care

## 2015-09-05 ENCOUNTER — Encounter: Payer: Self-pay | Admitting: Acute Care

## 2015-09-05 ENCOUNTER — Inpatient Hospital Stay: Admission: RE | Admit: 2015-09-05 | Payer: BLUE CROSS/BLUE SHIELD | Source: Ambulatory Visit

## 2015-09-05 ENCOUNTER — Other Ambulatory Visit: Payer: Self-pay | Admitting: Acute Care

## 2015-09-05 ENCOUNTER — Ambulatory Visit (INDEPENDENT_AMBULATORY_CARE_PROVIDER_SITE_OTHER): Payer: BLUE CROSS/BLUE SHIELD | Admitting: Acute Care

## 2015-09-05 DIAGNOSIS — F17210 Nicotine dependence, cigarettes, uncomplicated: Secondary | ICD-10-CM | POA: Diagnosis not present

## 2015-09-05 DIAGNOSIS — Z87891 Personal history of nicotine dependence: Secondary | ICD-10-CM

## 2015-09-05 NOTE — Progress Notes (Signed)
Shared Decision Making Visit Lung Cancer Screening Program [phone])   Eligibility:  Age 60 y.o.  Pack Years Smoking History Calculation : 47 pack years (# packs/per year x # years smoked)  Recent History of coughing up blood : No  Unexplained weight loss? No ( >Than 15 pounds within the last 6 months )  Prior History Lung / other cancer No (Diagnosis within the last 5 years already requiring surveillance chest CT Scans).  Smoking Status Current Smoker  Former Smokers: Years since quit: NA  Quit Date: NA  Visit Components:  Discussion included one or more decision making aids. Yes  Discussion included risk/benefits of screening.Yes  Discussion included potential follow up diagnostic testing for abnormal scans. Yes  Discussion included meaning and risk of over diagnosis.Yes  Discussion included meaning and risk of False Positives. Yes  Discussion included meaning of total radiation exposure. Yes  Counseling Included:  Importance of adherence to annual lung cancer LDCT screening. Yes  Impact of comorbidities on ability to participate in the program. Yes  Ability and willingness to under diagnostic treatment. Yes  Smoking Cessation Counseling:  Current Smokers:   Discussed importance of smoking cessation. Yes  Information about tobacco cessation classes and interventions provided to patient. Yes  Patient provided with "ticket" for LDCT Scan. Yes  Symptomatic Patient. No  Counseling: NA  Diagnosis Code: Tobacco Use Z72.0  Asymptomatic Patient Yes  Counseling Yes  Former Smokers:   Discussed the importance of maintaining cigarette abstinence. Yes  Diagnosis Code: Personal History of Nicotine Dependence. Q73.419  Information about tobacco cessation classes and interventions provided to patient.Yes  Patient provided with "ticket" for LDCT Scan. Yes  Written Order for Lung Cancer Screening with LDCT placed in Epic. Yes (CT Chest Lung Cancer Screening  Low Dose W/O CM) [id number] Z12.2-Screening of respiratory organs Z87.891-Personal history of nicotine dependence   [name] Spatz, NP   I have spent 20 minutes of face to face time with [name] [name] discussing the risks and benefits of lung cancer screening. We viewed a power point together that explained in detail the above noted topics. We paused at intervals to allow for questions to be asked and answered to ensure understanding.We discussed that the single most powerful action that she can take to decrease her risk of developing lung cancer is to quit smoking. We discussed whether or not she  is ready to commit to setting a quit date. She is working on decreasing the number of cigarettes she smokes each day, but is not ready to quit. We discussed options for tools to aid in quitting smoking including nicotine replacement therapy, non-nicotine medications, support groups, Quit Smart classes, and behavior modification. We discussed that often times setting smaller, more achievable goals, such as eliminating 1 cigarette a day for a week and then 2 cigarettes a day for a week can be helpful in slowly decreasing the number of cigarettes smoked. This allows for a sense of accomplishment as well as providing a clinical benefit. I gave given her  the " Be Stronger Than Your Excuses" card with contact information for community resources, classes, free nicotine replacement therapy, and access to mobile apps, text messaging, and on-line smoking cessation help. I have also given her  my card and contact information in the event she needs to contact me. We discussed the time and location of the scan, and that either June Leap, CMA, or I will call with the results within 24-48 hours of receiving them. I have provided  her with a copy of the power point we viewed  as a resource in the event they need reinforcement of the concepts we discussed today in the office. The patient verbalized understanding of all of  the above  and had no further questions upon leaving the office. They have my contact information in the event they have any further questions.

## 2015-10-03 ENCOUNTER — Other Ambulatory Visit: Payer: BLUE CROSS/BLUE SHIELD

## 2015-10-03 ENCOUNTER — Ambulatory Visit (INDEPENDENT_AMBULATORY_CARE_PROVIDER_SITE_OTHER): Payer: BLUE CROSS/BLUE SHIELD | Admitting: Pulmonary Disease

## 2015-10-03 ENCOUNTER — Encounter: Payer: Self-pay | Admitting: Pulmonary Disease

## 2015-10-03 VITALS — BP 142/78 | HR 78 | Ht 63.0 in | Wt 119.0 lb

## 2015-10-03 DIAGNOSIS — Q8501 Neurofibromatosis, type 1: Secondary | ICD-10-CM | POA: Diagnosis not present

## 2015-10-03 DIAGNOSIS — R05 Cough: Secondary | ICD-10-CM | POA: Diagnosis not present

## 2015-10-03 DIAGNOSIS — F17200 Nicotine dependence, unspecified, uncomplicated: Secondary | ICD-10-CM

## 2015-10-03 DIAGNOSIS — J432 Centrilobular emphysema: Secondary | ICD-10-CM

## 2015-10-03 DIAGNOSIS — R0600 Dyspnea, unspecified: Secondary | ICD-10-CM

## 2015-10-03 DIAGNOSIS — R059 Cough, unspecified: Secondary | ICD-10-CM

## 2015-10-03 DIAGNOSIS — R0609 Other forms of dyspnea: Secondary | ICD-10-CM

## 2015-10-03 DIAGNOSIS — K219 Gastro-esophageal reflux disease without esophagitis: Secondary | ICD-10-CM

## 2015-10-03 MED ORDER — BENZONATATE 100 MG PO CAPS: 100.0000 mg | ORAL_CAPSULE | Freq: Three times a day (TID) | ORAL | Status: DC

## 2015-10-03 MED ORDER — FLUTTER DEVI: Status: DC

## 2015-10-03 NOTE — Assessment & Plan Note (Signed)
Cough less than a year. Could be CB. Continues to smoke. Also sinus issues and reflux disease. Plan : 1. Continue Flonase, 2 squirts per nostril at bedtime. 2. Continue Zyrtec, 10 mg daily. 3. Delsym did NOT help.  4. Rx reflux. 5. Start flutter valve.  6. May try daliresp.

## 2015-10-03 NOTE — Assessment & Plan Note (Addendum)
Patient with cystic lung disease associated with NF1. 45 pack year smoking history, continues to smoke a pack a day. PFT (07/2015)  FEV1 1.82  76% Chest ct scan (07/2015) Extensive changes of paraseptal and centrilobular emphysema with paraseptal emphysema predominating. Honeycombing primarily throughout the lower lobes peripherally and subpleurally consistent with idiopathic pulmonary fibrosis.  Pt is SOB with usual ADLs. Has chronic cough.  Neb meds are helping with SOB.  She is worse compared to her PFT numbers.   Plan: 1. Personally, I would avoid MDIs so she does not strain and cause pneumothorax. 2. Cont Pulmicort neb BID,  DuoNeb 4 times a day and every 4 hours as needed. 3. Needs ABG, alpha one level. 4. PNA 23 - 07/2015. Needs prevnar 13 - 07/2016 5. Smoking cessation. 6. Plan for ONO. May need o2 at hs. May need sleep study.  7. May need pulm rehab.

## 2015-10-03 NOTE — Assessment & Plan Note (Addendum)
Likely related to underlying lung dse. Need echo to R/O cradiac cause of SOB

## 2015-10-03 NOTE — Assessment & Plan Note (Signed)
Patient has neurofibromatosis, type I, congenital. Her cysts have been increasing through the years. She does not see anyone in particular regarding this. She had bilateral pneumothorax in 2003 requiring chest tube in surgery. Reason dyspnea the last year related to cough as well. Patient continues to smoke. Chest x-ray (April/2017) bilateral upper cystic disease. 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 Of course, with her smoking, it does not help. Plan : 1. Chest CT scan, no contrast to better delineate the parenchyma. 2. May need follow-up with regards to her NF1 at [facility]. She was contemplating going to [facility] but it never happened. 3. See COPD plan 4. Avoid straining. Plan to suppress cough. 5. Told pt to call 911 stat if with acute SOB, worsening SOB.

## 2015-10-03 NOTE — Patient Instructions (Signed)
It was a pleasure taking care of you today!  You are diagnosed with Chronic Obstructive Pulmonary Disease or COPD.  COPD is a preventable and treatable disease that makes it difficult to empty air out of the lungs (airflow obstruction).  This can lead to shortness of breath.   Sometimes, when you have a lung infection, this can make your breathing worse, and will cause you to have a COPD flare-up or an acute exacerbation of COPD. Please call your primary care doctor or the office if you are having a COPD flare-up.   Smoking makes COPD worse.   Make sure you use your medications for COPD -- Maintenance medications : Duoneb 4x/day and q4 hrs as needed for shortness of breath and Pulmicort neb 2x/day.  Cont flonase, zyrtec. Start Tessalon perles 3x/day. Start flutter valve. We will do blood test and overnight oximetry.   Please rinse your mouth each time you use your maintenance medication.  Please call the office if you are having issues with your medications  Return to clinic in 3 months.

## 2015-10-03 NOTE — Progress Notes (Signed)
Subjective:    Patient ID: [name] [name], female    DOB: 05-Apr-1956, 60 y.o.   MRN: [id number]  HPI   This is the case of [name] [name], 60 y.o. Female, who was referred by Dr. Roma Schanz  in consultation regarding abnormal CXR/COPD.   As you very well know, patient 43 PY smoking history, still smokes 1PPD.  Pt has congenital NF1. She does not see anyone in particular for her NF1.  Her cysts have been increasing in number through the years.   Was dxed with asthmatic bronchitis as a child. Outgrew it.  In 2003,she acutely became SOB. L lung was noted to be collapsed. Had a L chest tube. She had surgery for L PTX and ended up having PTX on R. Had a chest tube in the R. Ended up having surgery in R as well. Was told she had numerous cysts.  Had suregry in [facility]. Did NOT see pulm. No issues until 8 mos ago.  8 mos ago with more SOB with ADLs. Also with a dry cough. Some worse.  Has UACS > placed on Flonase. Cough not significantly beter. Takes zyrtec daily.   Not on inhalers.   Has GERD. Takes Zantac prn.    DATA   PFT (07/2015)  FEV1  1.82  76%. She looks worse than her numbers.   ROV (10/03/15) pt is here as f/u on her copd. Since last seen, she states she has been doing fair. Has not been admiited nor has been on abx since last seen. Still smokes 1/2 PPD. Uses neb meds > feels better over all on meds. GERD is stable. Still coughs daily, not necessarily worse at night.    Review of Systems  Constitutional: Negative.  Negative for fever and unexpected weight change.  HENT: Positive for congestion, postnasal drip, sinus pressure, sore throat and trouble swallowing. Negative for dental problem, ear pain, nosebleeds, rhinorrhea and sneezing.   Eyes: Positive for visual disturbance. Negative for redness and itching.  Respiratory: Positive for cough, shortness of breath and wheezing.   Cardiovascular: Positive for palpitations. Negative for leg swelling.  Gastrointestinal: Negative.   Negative for nausea and vomiting.  Endocrine: Negative.   Genitourinary: Negative.  Negative for dysuria.  Musculoskeletal: Positive for back pain and arthralgias. Negative for joint swelling.  Skin: Negative.  Negative for rash.  Allergic/Immunologic: Positive for environmental allergies.  Neurological: Positive for headaches.  Hematological: Bruises/bleeds easily.  Psychiatric/Behavioral: Negative.  Negative for dysphoric mood. The patient is not nervous/anxious.        Objective:   Physical Exam  Vitals:  Filed Vitals:   10/03/15 1338  BP: 142/78  Pulse: 78  Height: '5\' 3"'$  (1.6 m)  Weight: 119 lb (53.978 kg)  SpO2: 97%    Constitutional/General:  Pleasant, well-nourished, well-developed, not in any distress,  Comfortably seating.  Well kempt. Chronically ill.   Body mass index is 21.09 kg/(m^2). Wt Readings from Last 3 Encounters:  10/03/15 119 lb (53.978 kg)  08/02/15 120 lb (54.432 kg)  07/26/15 120 lb 3.2 oz (54.522 kg)    HEENT: Pupils equal and reactive to light and accommodation. Anicteric sclerae. Normal nasal mucosa.   No oral  lesions,  mouth clear,  oropharynx clear, no postnasal drip. (-) Oral thrush. No dental caries.  Airway - Mallampati class III Pt with cysts in her face, neck, chest, abd, extremities.   Neck: No masses. Midline trachea. No JVD, (-) LAD. (-) bruits appreciated.  Respiratory/Chest: Grossly normal  chest. (-) deformity. (-) Accessory muscle use.  Symmetric expansion. (-) Tenderness on palpation.  Resonant on percussion.  Diminished BS on both lower lung zones. (-)crackles, rhonchi. Occasional wheezes at the bases.  (-) egophony  Cardiovascular: Regular rate and  rhythm, heart sounds normal, no murmur or gallops, no peripheral edema  Gastrointestinal:  Normal bowel sounds. Soft, non-tender. No hepatosplenomegaly.  (-) masses.   Musculoskeletal:  Normal muscle tone. Normal gait.   Extremities: Grossly normal. (+)  clubbing,  cyanosis.  (-) edema  Skin: (-) rash,lesions seen.   Neurological/Psychiatric : alert, oriented to time, place, person. Normal mood and affect            Assessment & Plan:  COPD (chronic obstructive pulmonary disease) with emphysema ([place]) Patient with cystic lung disease associated with NF1. 45 pack year smoking history, continues to smoke a pack a day. PFT (07/2015)  FEV1 1.82  76% Chest ct scan (07/2015) Extensive changes of paraseptal and centrilobular emphysema with paraseptal emphysema predominating. Honeycombing primarily throughout the lower lobes peripherally and subpleurally consistent with idiopathic pulmonary fibrosis.  Pt is SOB with usual ADLs. Has chronic cough.  Neb meds are helping with SOB.  She is worse compared to her PFT numbers.   Plan: 1. Personally, I would avoid MDIs so she does not strain and cause pneumothorax. 2. Cont Pulmicort neb BID,  DuoNeb 4 times a day and every 4 hours as needed. 3. Needs ABG, alpha one level. 4. PNA 23 - 07/2015. Needs prevnar 13 - 07/2016 5. Smoking cessation. 6. Plan for ONO. May need o2 at hs. May need sleep study.  7. May need pulm rehab.    Neurofibromatosis, type 1 (von Recklinghausen's disease) ([place]) Patient has neurofibromatosis, type I, congenital. Her cysts have been increasing through the years. She does not see anyone in particular regarding this. She had bilateral pneumothorax in 2003 requiring chest tube in surgery. Reason dyspnea the last year related to cough as well. Patient continues to smoke. Chest x-ray (April/2017) bilateral upper cystic disease. 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 Of course, with her smoking, it does not help. Plan : 1. Chest CT scan, no contrast to better delineate the parenchyma. 2. May need follow-up with regards to her NF1 at [facility]. She was contemplating going to [facility] but it never happened. 3. See COPD plan 4.  Avoid straining. Plan to suppress cough. 5. Told pt to call 911 stat if with acute SOB, worsening SOB.     TOBACCO USE Smoking cessation done    Cough Cough less than a year. Could be CB. Continues to smoke. Also sinus issues and reflux disease. Plan : 1. Continue Flonase, 2 squirts per nostril at bedtime. 2. Continue Zyrtec, 10 mg daily. 3. Delsym did NOT help.  4. Rx reflux. 5. Start flutter valve.  6. May try daliresp.   GERD (gastroesophageal reflux disease) cont prilosec, 40 mg at hs.     Exertional dyspnea Likely related to underlying lung dse. Need echo to R/O cradiac cause of SOB      Return to clinic in 3 mos.     Monica Becton, MD 10/04/2015, 1:53 AM [facility] Pager [phone] After 3 pm or if no answer, call [phone]      J. Shirl Harris, MD 10/04/2015   1:53 AM [facility] [place] Pager: [phone] Office: [phone],  Fax: [phone]

## 2015-10-03 NOTE — Assessment & Plan Note (Addendum)
cont prilosec, 40 mg at hs.

## 2015-10-03 NOTE — Assessment & Plan Note (Signed)
Smoking cessation done.  

## 2015-10-04 ENCOUNTER — Ambulatory Visit (HOSPITAL_COMMUNITY): Payer: BLUE CROSS/BLUE SHIELD | Attending: Internal Medicine

## 2015-10-04 ENCOUNTER — Other Ambulatory Visit: Payer: Self-pay

## 2015-10-04 DIAGNOSIS — I341 Nonrheumatic mitral (valve) prolapse: Secondary | ICD-10-CM | POA: Insufficient documentation

## 2015-10-04 DIAGNOSIS — R0609 Other forms of dyspnea: Secondary | ICD-10-CM | POA: Diagnosis not present

## 2015-10-04 DIAGNOSIS — I5189 Other ill-defined heart diseases: Secondary | ICD-10-CM | POA: Diagnosis not present

## 2015-10-04 DIAGNOSIS — R0600 Dyspnea, unspecified: Secondary | ICD-10-CM

## 2015-10-04 DIAGNOSIS — Z720 Tobacco use: Secondary | ICD-10-CM | POA: Insufficient documentation

## 2015-10-04 DIAGNOSIS — I340 Nonrheumatic mitral (valve) insufficiency: Secondary | ICD-10-CM | POA: Insufficient documentation

## 2015-10-04 LAB — ECHOCARDIOGRAM COMPLETE
AO mean calculated velocity dopler: 45.9 cm/s
AV Mean grad: 1 mmHg
AV Peak grad: 3 mmHg
AV pk vel: 90.2 cm/s
Ao-asc: 29 cm
E decel time: 211 msec
E/e' ratio: 11.03
FS: 45 % — AB (ref 28–44)
IVS/LV PW RATIO, ED: 1.42
LA ID, A-P, ES: 24 mm
LA diam end sys: 24 mm
LA diam index: 1.55 cm/m2
LA vol A4C: 18.8 ml
LA vol index: 12.3 mL/m2
LA vol: 19 mL
LV E/e' medial: 11.03
LV E/e'average: 11.03
LV PW d: 6.25 mm — AB (ref 0.6–1.1)
LV e' LATERAL: 6.2 cm/s
LVOT area: 3.14 cm2
LVOT diameter: 20 mm
MV Dec: 211
MV pk A vel: 83.9 m/s
MV pk E vel: 68.4 m/s
TAPSE: 23.4 mm
TDI e' lateral: 6.2
TDI e' medial: 4.46
VTI: 16.3 cm

## 2015-10-06 LAB — ALPHA-1 ANTITRYPSIN PHENOTYPE: A-1 Antitrypsin: 171 mg/dL (ref 83–199)

## 2015-10-10 ENCOUNTER — Ambulatory Visit (HOSPITAL_COMMUNITY)
Admission: RE | Admit: 2015-10-10 | Discharge: 2015-10-10 | Disposition: A | Discharge status: Home / Self Care | Payer: BLUE CROSS/BLUE SHIELD | Source: Ambulatory Visit | Attending: Pulmonary Disease | Admitting: Pulmonary Disease

## 2015-10-10 DIAGNOSIS — J432 Centrilobular emphysema: Secondary | ICD-10-CM | POA: Insufficient documentation

## 2015-10-10 LAB — BLOOD GAS, ARTERIAL
Acid-base deficit: 1.3 mmol/L (ref 0.0–2.0)
Bicarbonate: 22.2 mEq/L (ref 20.0–24.0)
Drawn by: 205171
FIO2: 0.21
O2 Saturation: 96.4 %
Patient temperature: 98.6
TCO2: 19.7 mmol/L (ref 0–100)
pCO2 arterial: 35.4 mmHg (ref 35.0–45.0)
pH, Arterial: 7.414 (ref 7.350–7.450)
pO2, Arterial: 83.2 mmHg (ref 80.0–100.0)

## 2015-10-14 ENCOUNTER — Encounter: Payer: Self-pay | Admitting: Family Medicine

## 2015-10-27 ENCOUNTER — Telehealth: Payer: Self-pay | Admitting: Pulmonary Disease

## 2015-10-27 NOTE — Telephone Encounter (Signed)
Records request received via fax from [place] for [name] [name] (patient). These were sent by interoffice mail to [facility] and marked as "time sensitive".

## 2015-11-01 ENCOUNTER — Encounter: Payer: Self-pay | Admitting: Pulmonary Disease

## 2015-11-10 ENCOUNTER — Other Ambulatory Visit: Payer: Self-pay | Admitting: Family Medicine

## 2015-11-10 DIAGNOSIS — J449 Chronic obstructive pulmonary disease, unspecified: Secondary | ICD-10-CM

## 2015-11-10 DIAGNOSIS — J439 Emphysema, unspecified: Secondary | ICD-10-CM

## 2015-11-22 HISTORY — PX: EYE SURGERY: SHX253

## 2015-11-24 ENCOUNTER — Ambulatory Visit: Payer: BLUE CROSS/BLUE SHIELD

## 2015-11-30 ENCOUNTER — Encounter: Payer: Self-pay | Admitting: Family Medicine

## 2015-12-15 ENCOUNTER — Encounter: Payer: Self-pay | Admitting: Family Medicine

## 2015-12-27 ENCOUNTER — Ambulatory Visit (INDEPENDENT_AMBULATORY_CARE_PROVIDER_SITE_OTHER): Payer: BLUE CROSS/BLUE SHIELD | Admitting: Pulmonary Disease

## 2015-12-27 ENCOUNTER — Encounter: Payer: Self-pay | Admitting: Pulmonary Disease

## 2015-12-27 DIAGNOSIS — Q8501 Neurofibromatosis, type 1: Secondary | ICD-10-CM | POA: Diagnosis not present

## 2015-12-27 DIAGNOSIS — Z720 Tobacco use: Secondary | ICD-10-CM

## 2015-12-27 DIAGNOSIS — R05 Cough: Secondary | ICD-10-CM

## 2015-12-27 DIAGNOSIS — K219 Gastro-esophageal reflux disease without esophagitis: Secondary | ICD-10-CM

## 2015-12-27 DIAGNOSIS — J432 Centrilobular emphysema: Secondary | ICD-10-CM

## 2015-12-27 DIAGNOSIS — R059 Cough, unspecified: Secondary | ICD-10-CM

## 2015-12-27 MED ORDER — ALBUTEROL SULFATE HFA 108 (90 BASE) MCG/ACT IN AERS: 2.0000 | INHALATION_SPRAY | Freq: Four times a day (QID) | RESPIRATORY_TRACT | 2 refills | Status: DC | PRN

## 2015-12-27 MED ORDER — IPRATROPIUM-ALBUTEROL 0.5-2.5 (3) MG/3ML IN SOLN: RESPIRATORY_TRACT | 5 refills | Status: DC

## 2015-12-27 MED ORDER — IPRATROPIUM-ALBUTEROL 0.5-2.5 (3) MG/3ML IN SOLN
RESPIRATORY_TRACT | 5 refills | Status: DC
Start: 2015-12-27 — End: 2019-09-24

## 2015-12-27 NOTE — Assessment & Plan Note (Addendum)
cont PPI, 40 mg at hs.

## 2015-12-27 NOTE — Progress Notes (Signed)
Subjective:    Patient ID: [name] [name], female    DOB: Sep 22, 1955, 60 y.o.   MRN: [id number]  HPI   Pt is  [name] [name], 60 y.o. Female, who was referred by Dr. Roma Schanz  in consultation regarding abnormal CXR/COPD.   As you very well know, patient 34 PY smoking history, still smokes 1PPD.  Pt has congenital NF1. She does not see anyone in particular for her NF1.  Her cysts have been increasing in number through the years.   Was dxed with asthmatic bronchitis as a child. Outgrew it.  In 2003,she acutely became SOB. L lung was noted to be collapsed. Had a L chest tube. She had surgery for L PTX and ended up having PTX on R. Had a chest tube in the R. Ended up having surgery in R as well. Was told she had numerous cysts.  Had suregry in [facility]. Did NOT see pulm. No issues until 8 mos ago.  Pt was initially seen for worsening SOB.   DATA   PFT (07/2015)  FEV1  1.82  76%. She looks worse than her numbers.  Chest CT scan (09/2015)  Extensive emphysema ABG (6/22017)  7.4/35/83 RA ONO (09/2015)  Do sig desatn  ROV (10/03/15) pt is here as f/u on her copd. Since last seen, she states she has been doing fair. Has not been admiited nor has been on abx since last seen. Still smokes 1/2 PPD. Uses neb meds > feels better over all on meds. GERD is stable. Still coughs daily, not necessarily worse at night.   ROV 12/27/15 Patient returns to the office as follow-up on her COPD. Since last seen, she states she's been doing fair. Has not been admitted nor been on antibiotics since last seen. She uses her nebulizer. Still smokes cigarettes. GERD is stable. Continues to cough daily.   Review of Systems  Constitutional: Negative.  Negative for fever and unexpected weight change.  HENT: Positive for congestion, postnasal drip and sinus pressure. Negative for dental problem, ear pain, nosebleeds, rhinorrhea, sneezing, sore throat and trouble swallowing.   Eyes: Negative for redness, itching and visual  disturbance.  Respiratory: Positive for cough and shortness of breath. Negative for wheezing.   Cardiovascular: Negative for palpitations and leg swelling.  Gastrointestinal: Negative.  Negative for nausea and vomiting.  Endocrine: Negative.   Genitourinary: Negative.  Negative for dysuria.  Musculoskeletal: Positive for arthralgias and back pain. Negative for joint swelling.  Skin: Negative.  Negative for rash.  Allergic/Immunologic: Negative.  Negative for environmental allergies.  Neurological: Negative for headaches.  Hematological: Negative.  Does not bruise/bleed easily.  Psychiatric/Behavioral: Negative.  Negative for dysphoric mood. The patient is not nervous/anxious.        Objective:   Physical Exam  Vitals:  Vitals:   12/27/15 0857  BP: 118/62  Pulse: 92  SpO2: 97%  Weight: 120 lb 6.4 oz (54.6 kg)  Height: '5\' 3"'$  (1.6 m)    Constitutional/General:  Pleasant, well-nourished, well-developed, not in any distress,  Comfortably seating.  Well kempt. Chronically ill.   Body mass index is 21.33 kg/m. Wt Readings from Last 3 Encounters:  12/27/15 120 lb 6.4 oz (54.6 kg)  10/03/15 119 lb (54 kg)  08/02/15 120 lb (54.4 kg)    HEENT: Pupils equal and reactive to light and accommodation. Anicteric sclerae. Normal nasal mucosa.   No oral  lesions,  mouth clear,  oropharynx clear, no postnasal drip. (-) Oral thrush. No dental caries.  Airway - Mallampati class III Pt with cysts in her face, neck, chest, abd, extremities.   Neck: No masses. Midline trachea. No JVD, (-) LAD. (-) bruits appreciated.  Respiratory/Chest: Grossly normal chest. (-) deformity. (-) Accessory muscle use.  Symmetric expansion. (-) Tenderness on palpation.  Resonant on percussion.  Diminished BS on both lower lung zones. (-)crackles, rhonchi. Occasional wheezes at the bases.  (-) egophony  Cardiovascular: Regular rate and  rhythm, heart sounds normal, no murmur or gallops, no peripheral  edema  Gastrointestinal:  Normal bowel sounds. Soft, non-tender. No hepatosplenomegaly.  (-) masses.   Musculoskeletal:  Normal muscle tone. Normal gait.   Extremities: Grossly normal. (+)  clubbing, cyanosis.  (-) edema  Skin: (-) rash,lesions seen.   Neurological/Psychiatric : alert, oriented to time, place, person. Normal mood and affect            Assessment & Plan:  COPD (chronic obstructive pulmonary disease) with emphysema ([place]) Patient with cystic lung disease associated with NF1. 45 pack year smoking history, continues to smoke 1/2 pack a day. PFT (07/2015)  FEV1 1.82  76% ABG 7.4/35/83 on RA (09/2015)  Chest ct scan (07/2015) Extensive changes of paraseptal and centrilobular emphysema with paraseptal emphysema predominating. Honeycombing primarily throughout the lower lobes peripherally and subpleurally consistent with idiopathic pulmonary fibrosis. ONO (09/2015)  No sig desatn Alpha one (09/2015) N  Pt is SOB with usual ADLs. Has chronic cough.  Neb meds are helping with SOB.  She is worse compared to her PFT numbers.   Plan: 1. Personally, I would avoid MDIs so she does not strain and cause pneumothorax. 2. Cont Pulmicort neb BID,  DuoNeb 4 times a day and every 4 hours as needed. Pro air/ alb MDI prn.  3.PNA 23 - 07/2015. Needs prevnar 13 - 07/2016. Got flu shot 11/2015/  4. Smoking cessation. 5. May need pulm rehab but her numbers are not bad enough. Will observe.   GERD (gastroesophageal reflux disease) cont PPI, 40 mg at hs.     Neurofibromatosis, type 1 (von Recklinghausen's disease) ([place]) Patient has neurofibromatosis, type I, congenital. Her cutaneous cysts have been increasing through the years. She does not see anyone in particular regarding this. She had bilateral pneumothorax in 2003 requiring chest tube in surgery. Patient continues to smoke. Chest ct scan (09/2015)  Severe emphysema  Plan : 1. May need follow-up with regards to her NF1 at [facility].  She was contemplating going to [facility] but it never happened. 2. See COPD plan 3. Avoid straining 2/2 h/o PTX and severe copd based on chest ct scan   Tobacco user Smoking cessation done    Cough Cough less than a year. Could be CB. Continues to smoke. Also sinus issues and reflux disease. Plan : 1. Flonase made her nose bleed. She stopped it and cough is not worse. If worse, may try nasonex.  2. Continue Zyrtec, 10 mg daily. 3. Delsym did NOT help.  4. Rx reflux. 5. Cont v flutter valve.  6. May try daliresp if with worse cough. Observe for now.    Return to clinic in 4  mos.     Monica Becton, MD 12/27/2015, 9:32 AM [place] Pager [phone] After 3 pm or if no answer, call [phone]      J. Shirl Harris, MD 12/27/2015   9:32 AM Pulmonary and [place] Pager: [phone] Office: [phone], Fax: [phone]

## 2015-12-27 NOTE — Patient Instructions (Signed)
It was a pleasure taking care of you today!  You are diagnosed with Chronic Obstructive Pulmonary Disease or COPD.  COPD is a preventable and treatable disease that makes it difficult to empty air out of the lungs (airflow obstruction).  This can lead to shortness of breath.   Sometimes, when you have a lung infection, this can make your breathing worse, and will cause you to have a COPD flare-up or an acute exacerbation of COPD. Please call your primary care doctor or the office if you are having a COPD flare-up.   Smoking makes COPD worse.   Make sure you use your medications for COPD -- Maintenance medications : Pulmicort nebulizer, twice a day and DuoNeb 4 times a day.  Rescue medications: Albuterol 2 puffs every 4 hours as needed for shortness of breath.   Please rinse your mouth each time you use your maintenance medication.  Please call the office if you are having issues with your medications  Continue your  medicines for cough and reflux.  Return to clinic in 4 mos with Dr. Corrie Dandy

## 2015-12-27 NOTE — Assessment & Plan Note (Signed)
Smoking cessation done.  

## 2015-12-27 NOTE — Assessment & Plan Note (Addendum)
Patient has neurofibromatosis, type I, congenital. Her cutaneous cysts have been increasing through the years. She does not see anyone in particular regarding this. She had bilateral pneumothorax in 2003 requiring chest tube in surgery. Patient continues to smoke. Chest ct scan (09/2015)  Severe emphysema  Plan : 1. May need follow-up with regards to her NF1 at [facility]. She was contemplating going to [facility] but it never happened. 2. See COPD plan 3. Avoid straining 2/2 h/o PTX and severe copd based on chest ct scan

## 2015-12-27 NOTE — Assessment & Plan Note (Addendum)
Patient with cystic lung disease associated with NF1. 45 pack year smoking history, continues to smoke 1/2 pack a day. PFT (07/2015)  FEV1 1.82  76% ABG 7.4/35/83 on RA (09/2015)  Chest ct scan (07/2015) Extensive changes of paraseptal and centrilobular emphysema with paraseptal emphysema predominating. Honeycombing primarily throughout the lower lobes peripherally and subpleurally consistent with idiopathic pulmonary fibrosis. ONO (09/2015)  No sig desatn Alpha one (09/2015) N  Pt is SOB with usual ADLs. Has chronic cough.  Neb meds are helping with SOB.  She is worse compared to her PFT numbers.   Plan: 1. Personally, I would avoid MDIs so she does not strain and cause pneumothorax. 2. Cont Pulmicort neb BID,  DuoNeb 4 times a day and every 4 hours as needed. Pro air/ alb MDI prn.  3.PNA 23 - 07/2015. Needs prevnar 13 - 07/2016. Got flu shot 11/2015/  4. Smoking cessation. 5. May need pulm rehab but her numbers are not bad enough. Will observe.

## 2015-12-27 NOTE — Assessment & Plan Note (Addendum)
Cough less than a year. Could be CB. Continues to smoke. Also sinus issues and reflux disease. Plan : 1. Flonase made her nose bleed. She stopped it and cough is not worse. If worse, may try nasonex.  2. Continue Zyrtec, 10 mg daily. 3. Delsym did NOT help.  4. Rx reflux. 5. Cont v flutter valve.  6. May try daliresp if with worse cough. Observe for now.

## 2016-01-16 ENCOUNTER — Encounter: Payer: Self-pay | Admitting: Pulmonary Disease

## 2016-02-09 ENCOUNTER — Other Ambulatory Visit: Payer: Self-pay | Admitting: Family Medicine

## 2016-02-09 DIAGNOSIS — Z1231 Encounter for screening mammogram for malignant neoplasm of breast: Secondary | ICD-10-CM

## 2016-02-27 ENCOUNTER — Ambulatory Visit
Admission: RE | Admit: 2016-02-27 | Discharge: 2016-02-27 | Disposition: A | Discharge status: Home / Self Care | Payer: BLUE CROSS/BLUE SHIELD | Source: Ambulatory Visit | Attending: Family Medicine | Admitting: Family Medicine

## 2016-02-27 DIAGNOSIS — Z1231 Encounter for screening mammogram for malignant neoplasm of breast: Secondary | ICD-10-CM

## 2016-04-30 ENCOUNTER — Ambulatory Visit (INDEPENDENT_AMBULATORY_CARE_PROVIDER_SITE_OTHER): Payer: BLUE CROSS/BLUE SHIELD | Admitting: Pulmonary Disease

## 2016-04-30 ENCOUNTER — Encounter: Payer: Self-pay | Admitting: Pulmonary Disease

## 2016-04-30 DIAGNOSIS — F17200 Nicotine dependence, unspecified, uncomplicated: Secondary | ICD-10-CM

## 2016-04-30 DIAGNOSIS — R05 Cough: Secondary | ICD-10-CM | POA: Diagnosis not present

## 2016-04-30 DIAGNOSIS — J432 Centrilobular emphysema: Secondary | ICD-10-CM

## 2016-04-30 DIAGNOSIS — K219 Gastro-esophageal reflux disease without esophagitis: Secondary | ICD-10-CM

## 2016-04-30 DIAGNOSIS — R059 Cough, unspecified: Secondary | ICD-10-CM

## 2016-04-30 DIAGNOSIS — Q8501 Neurofibromatosis, type 1: Secondary | ICD-10-CM

## 2016-04-30 NOTE — Assessment & Plan Note (Signed)
Cough over all better. Related to copd, smoking, GERD, sinus issues.   Plan : 1. Flonase made her nose bleed. She stopped it and cough is not worse. If worse, may try nasonex.  2. Continue Zyrtec, 10 mg daily. 3. Delsym did NOT help.  4. Rx reflux. Zantac 150 BID. Omeprazole made "GERD worse"  5. Cont with fluttevalve.  6. May try daliresp if with worse cough. Observe for now.

## 2016-04-30 NOTE — Progress Notes (Signed)
Subjective:    Patient ID: [name] [name], female    DOB: 03/28/1956, 61 y.o.   MRN: [id number]  HPI   Pt is  [name] [name], 61 y.o. Female, who was referred by Dr. Roma Schanz  in consultation regarding abnormal CXR/COPD.   As you very well know, patient 68 PY smoking history, still smokes 1PPD.  Pt has congenital NF1. She does not see anyone in particular for her NF1.  Her cysts have been increasing in number through the years.   Was dxed with asthmatic bronchitis as a child. Outgrew it.  In 2003,she acutely became SOB. L lung was noted to be collapsed. Had a L chest tube. She had surgery for L PTX and ended up having PTX on R. Had a chest tube in the R. Ended up having surgery in R as well. Was told she had numerous cysts.  Had suregry in [facility]. Did NOT see pulm. No issues until 8 mos ago.  Pt was initially seen for worsening SOB.   DATA   PFT (07/2015)  FEV1  1.82  76%. She looks worse than her numbers.  Chest CT scan (09/2015)  Extensive emphysema ABG (6/22017)  7.4/35/83 RA ONO (09/2015)  Do sig desatn  ROV (10/03/15) pt is here as f/u on her copd. Since last seen, she states she has been doing fair. Has not been admiited nor has been on abx since last seen. Still smokes 1/2 PPD. Uses neb meds > feels better over all on meds. GERD is stable. Still coughs daily, not necessarily worse at night.   ROV 12/27/15 Patient returns to the office as follow-up on her COPD. Since last seen, she states she's been doing fair. Has not been admitted nor been on antibiotics since last seen. She uses her nebulizer. Still smokes cigarettes. GERD is stable. Continues to cough daily.  ROV 04/30/16 patient returns to the office as follow-up on her COPD. Since last seen, her dyspnea is stable. She uses her nebulizer medicines as scheduled. Has not been admitted nor been on antibiotics since last seen. Continues to smoke cigarettes but down to 5 servings a day.   Review of Systems  Constitutional:  Negative.  Negative for fever and unexpected weight change.  HENT: Positive for congestion, postnasal drip and sinus pressure. Negative for dental problem, ear pain, nosebleeds, rhinorrhea, sneezing, sore throat and trouble swallowing.   Eyes: Negative for redness, itching and visual disturbance.  Respiratory: Positive for cough and shortness of breath. Negative for wheezing.   Cardiovascular: Negative for palpitations and leg swelling.  Gastrointestinal: Negative.  Negative for nausea and vomiting.  Endocrine: Negative.   Genitourinary: Negative.  Negative for dysuria.  Musculoskeletal: Positive for arthralgias and back pain. Negative for joint swelling.  Skin: Negative.  Negative for rash.  Allergic/Immunologic: Negative.  Negative for environmental allergies.  Neurological: Negative for headaches.  Hematological: Negative.  Does not bruise/bleed easily.  Psychiatric/Behavioral: Negative.  Negative for dysphoric mood. The patient is not nervous/anxious.        Objective:   Physical Exam  Vitals:  Vitals:   04/30/16 0855  BP: 112/64  Pulse: 75  SpO2: 97%  Weight: 125 lb 12.8 oz (57.1 kg)  Height: '5\' 3"'$  (1.6 m)    Constitutional/General:  Pleasant, well-nourished, well-developed, not in any distress,  Comfortably seating.  Well kempt. Chronically ill.   Body mass index is 22.28 kg/m. Wt Readings from Last 3 Encounters:  04/30/16 125 lb 12.8 oz (57.1 kg)  12/27/15 120 lb 6.4 oz (54.6 kg)  10/03/15 119 lb (54 kg)    HEENT: Pupils equal and reactive to light and accommodation. Anicteric sclerae. Normal nasal mucosa.   No oral  lesions,  mouth clear,  oropharynx clear, no postnasal drip. (-) Oral thrush. No dental caries.  Airway - Mallampati class III Pt with cysts in her face, neck, chest, abd, extremities.   Neck: No masses. Midline trachea. No JVD, (-) LAD. (-) bruits appreciated.  Respiratory/Chest: Grossly normal chest. (-) deformity. (-) Accessory muscle use.    Symmetric expansion. (-) Tenderness on palpation.  Resonant on percussion.  Diminished BS on both lower lung zones. (-)crackles, rhonchi. Occasional wheezes at the bases.  (-) egophony  Cardiovascular: Regular rate and  rhythm, heart sounds normal, no murmur or gallops, no peripheral edema  Gastrointestinal:  Normal bowel sounds. Soft, non-tender. No hepatosplenomegaly.  (-) masses.   Musculoskeletal:  Normal muscle tone. Normal gait.   Extremities: Grossly normal. (+)  clubbing, cyanosis.  (-) edema  Skin: (-) rash,lesions seen.   Neurological/Psychiatric : alert, oriented to time, place, person. Normal mood and affect            Assessment & Plan:  Cough Cough over all better. Related to copd, smoking, GERD, sinus issues.   Plan : 1. Flonase made her nose bleed. She stopped it and cough is not worse. If worse, may try nasonex.  2. Continue Zyrtec, 10 mg daily. 3. Delsym did NOT help.  4. Rx reflux. Zantac 150 BID. Omeprazole made "GERD worse"  5. Cont with fluttevalve.  6. May try daliresp if with worse cough. Observe for now.   TOBACCO USE Smoking cessation done  Neurofibromatosis, type 1 (von Recklinghausen's disease) ([place]) Patient has neurofibromatosis, type I, congenital. Her cutaneous cysts have been increasing through the years. She does not see anyone in particular regarding this. She had bilateral pneumothorax in 2003 requiring chest tube in surgery. Patient continues to smoke. Chest ct scan (09/2015)  Severe emphysema  Plan : 1. May need follow-up with regards to her NF1 at [facility]. She was contemplating going to [facility] but it never happened. 2. See COPD plan 3. Avoid straining 2/2 h/o PTX and severe copd based on chest ct scan   COPD (chronic obstructive pulmonary disease) with emphysema ([place]) Patient with cystic lung disease associated with NF1. 45 pack year smoking history, continues to smoke cigarettes. Now down to 5 cigs/day PFT (07/2015)  FEV1  1.82  76% ABG 7.4/35/83 on RA (09/2015)  Chest ct scan (07/2015) Extensive changes of paraseptal and centrilobular emphysema with paraseptal emphysema predominating. Honeycombing primarily throughout the lower lobes peripherally and subpleurally consistent with idiopathic pulmonary fibrosis. ONO (09/2015)  No sig desatn Alpha one (09/2015) N  Pt is SOB with usual ADLs. Has chronic cough.  Neb meds are helping with SOB.  She is worse compared to her PFT numbers.   Plan: 1. Personally, I would avoid MDIs so she does not strain and cause pneumothorax. 2. Cont Pulmicort neb BID,  DuoNeb 4 times a day and every 4 hours as needed. Pro air/ alb MDI prn. Try to make duoneb 2x/day rather than qid if her SOB is not worse. 3.PNA 23 - 07/2015. Needs prevnar 13 - 07/2016. Got flu shot 11/2015 4. Smoking cessation. 5. May need pulm rehab but her numbers are not bad enough. Will observe.   GERD (gastroesophageal reflux disease) cont zantac 150 mg BID.  Omeprazole made her GERD worse.  Return to clinic in 6 months.     Monica Becton, MD 04/30/2016, 9:22 AM [place] [facility] Pager [phone] After 3 pm or if no answer, call [phone]      J. Shirl Harris, MD 04/30/2016   9:22 AM Pulmonary and [place] Pager: [phone] Office: [phone], Fax: [phone]

## 2016-04-30 NOTE — Assessment & Plan Note (Signed)
Patient has neurofibromatosis, type I, congenital. Her cutaneous cysts have been increasing through the years. She does not see anyone in particular regarding this. She had bilateral pneumothorax in 2003 requiring chest tube in surgery. Patient continues to smoke. Chest ct scan (09/2015)  Severe emphysema  Plan : 1. May need follow-up with regards to her NF1 at [facility]. She was contemplating going to [facility] but it never happened. 2. See COPD plan 3. Avoid straining 2/2 h/o PTX and severe copd based on chest ct scan

## 2016-04-30 NOTE — Patient Instructions (Signed)
It was a pleasure taking care of you today!  You are diagnosed with Chronic Obstructive Pulmonary Disease or COPD.  COPD is a preventable and treatable disease that makes it difficult to empty air out of the lungs (airflow obstruction).  This can lead to shortness of breath.   Sometimes, when you have a lung infection, this can make your breathing worse, and will cause you to have a COPD flare-up or an acute exacerbation of COPD. Please call your primary care doctor or the office if you are having a COPD flare-up.   Smoking makes COPD worse.   Make sure you use your medications for COPD -- Maintenance medications : Pulmicort nebs 0.5 mg 2x/day and Duoneb neb 4x/day >> plan to decrease to 2x/day  Rescue medications: Albuterol 2 puffs every 4 hours as needed for shortness of breath.   Please rinse your mouth each time you use your maintenance medication.  Please call the office if you are having issues with your medications  Return to clinic in 6 months wit Dr/ [place]

## 2016-04-30 NOTE — Assessment & Plan Note (Signed)
Patient with cystic lung disease associated with NF1. 45 pack year smoking history, continues to smoke cigarettes. Now down to 5 cigs/day PFT (07/2015)  FEV1 1.82  76% ABG 7.4/35/83 on RA (09/2015)  Chest ct scan (07/2015) Extensive changes of paraseptal and centrilobular emphysema with paraseptal emphysema predominating. Honeycombing primarily throughout the lower lobes peripherally and subpleurally consistent with idiopathic pulmonary fibrosis. ONO (09/2015)  No sig desatn Alpha one (09/2015) N  Pt is SOB with usual ADLs. Has chronic cough.  Neb meds are helping with SOB.  She is worse compared to her PFT numbers.   Plan: 1. Personally, I would avoid MDIs so she does not strain and cause pneumothorax. 2. Cont Pulmicort neb BID,  DuoNeb 4 times a day and every 4 hours as needed. Pro air/ alb MDI prn. Try to make duoneb 2x/day rather than qid if her SOB is not worse. 3.PNA 23 - 07/2015. Needs prevnar 13 - 07/2016. Got flu shot 11/2015 4. Smoking cessation. 5. May need pulm rehab but her numbers are not bad enough. Will observe.

## 2016-04-30 NOTE — Assessment & Plan Note (Signed)
Smoking cessation done.  

## 2016-04-30 NOTE — Assessment & Plan Note (Signed)
cont zantac 150 mg BID.  Omeprazole made her GERD worse.

## 2016-06-26 ENCOUNTER — Encounter: Payer: Self-pay | Admitting: Acute Care

## 2016-08-07 ENCOUNTER — Ambulatory Visit (INDEPENDENT_AMBULATORY_CARE_PROVIDER_SITE_OTHER)
Admission: RE | Admit: 2016-08-07 | Discharge: 2016-08-07 | Disposition: A | Discharge status: Home / Self Care | Payer: BLUE CROSS/BLUE SHIELD | Source: Ambulatory Visit | Attending: Acute Care | Admitting: Acute Care

## 2016-08-07 DIAGNOSIS — Z87891 Personal history of nicotine dependence: Secondary | ICD-10-CM | POA: Diagnosis not present

## 2016-08-15 ENCOUNTER — Other Ambulatory Visit: Payer: Self-pay | Admitting: Acute Care

## 2016-08-15 DIAGNOSIS — F17210 Nicotine dependence, cigarettes, uncomplicated: Secondary | ICD-10-CM

## 2016-08-17 ENCOUNTER — Other Ambulatory Visit: Payer: Self-pay | Admitting: Family Medicine

## 2016-10-17 ENCOUNTER — Ambulatory Visit: Payer: BLUE CROSS/BLUE SHIELD | Admitting: Pulmonary Disease

## 2016-10-17 ENCOUNTER — Ambulatory Visit: Payer: BLUE CROSS/BLUE SHIELD | Admitting: Acute Care

## 2016-11-14 ENCOUNTER — Ambulatory Visit: Payer: BLUE CROSS/BLUE SHIELD | Admitting: Adult Health

## 2017-02-17 ENCOUNTER — Encounter: Payer: Self-pay | Admitting: Family Medicine

## 2017-05-06 ENCOUNTER — Encounter: Payer: Self-pay | Admitting: Family Medicine

## 2017-05-06 ENCOUNTER — Ambulatory Visit (INDEPENDENT_AMBULATORY_CARE_PROVIDER_SITE_OTHER): Payer: BLUE CROSS/BLUE SHIELD | Admitting: Family Medicine

## 2017-05-06 VITALS — BP 156/80 | HR 76 | Temp 98.0°F | Resp 16 | Ht 62.0 in | Wt 116.4 lb

## 2017-05-06 DIAGNOSIS — Z0000 Encounter for general adult medical examination without abnormal findings: Secondary | ICD-10-CM | POA: Diagnosis not present

## 2017-05-06 DIAGNOSIS — Q8500 Neurofibromatosis, unspecified: Secondary | ICD-10-CM

## 2017-05-06 DIAGNOSIS — L989 Disorder of the skin and subcutaneous tissue, unspecified: Secondary | ICD-10-CM

## 2017-05-06 LAB — CBC WITH DIFFERENTIAL/PLATELET
Basophils Absolute: 0.1 10*3/uL (ref 0.0–0.1)
Basophils Relative: 0.9 % (ref 0.0–3.0)
Eosinophils Absolute: 0.2 10*3/uL (ref 0.0–0.7)
Eosinophils Relative: 1.3 % (ref 0.0–5.0)
HCT: 43.9 % (ref 36.0–46.0)
Hemoglobin: 14.3 g/dL (ref 12.0–15.0)
Lymphocytes Relative: 31.7 % (ref 12.0–46.0)
Lymphs Abs: 4.3 10*3/uL — ABNORMAL HIGH (ref 0.7–4.0)
MCHC: 32.5 g/dL (ref 30.0–36.0)
MCV: 88.4 fl (ref 78.0–100.0)
Monocytes Absolute: 1.3 10*3/uL — ABNORMAL HIGH (ref 0.1–1.0)
Monocytes Relative: 9.2 % (ref 3.0–12.0)
Neutro Abs: 7.8 10*3/uL — ABNORMAL HIGH (ref 1.4–7.7)
Neutrophils Relative %: 56.9 % (ref 43.0–77.0)
Platelets: 381 10*3/uL (ref 150.0–400.0)
RBC: 4.97 Mil/uL (ref 3.87–5.11)
RDW: 15 % (ref 11.5–15.5)
WBC: 13.7 10*3/uL — ABNORMAL HIGH (ref 4.0–10.5)

## 2017-05-06 LAB — POC URINALSYSI DIPSTICK (AUTOMATED)
Bilirubin, UA: NEGATIVE
Blood, UA: NEGATIVE
Glucose, UA: NEGATIVE
Ketones, UA: NEGATIVE
Leukocytes, UA: NEGATIVE
Nitrite, UA: NEGATIVE
Protein, UA: NEGATIVE
Spec Grav, UA: 1.02 (ref 1.010–1.025)
Urobilinogen, UA: 0.2 E.U./dL
pH, UA: 6 (ref 5.0–8.0)

## 2017-05-06 LAB — COMPREHENSIVE METABOLIC PANEL
ALT: 15 U/L (ref 0–35)
AST: 18 U/L (ref 0–37)
Albumin: 4.3 g/dL (ref 3.5–5.2)
Alkaline Phosphatase: 69 U/L (ref 39–117)
BUN: 12 mg/dL (ref 6–23)
CO2: 29 mEq/L (ref 19–32)
Calcium: 9.9 mg/dL (ref 8.4–10.5)
Chloride: 107 mEq/L (ref 96–112)
Creatinine, Ser: 0.71 mg/dL (ref 0.40–1.20)
GFR: 88.24 mL/min (ref >60.00)
Glucose, Bld: 104 mg/dL — ABNORMAL HIGH (ref 70–99)
Potassium: 5.6 mEq/L — ABNORMAL HIGH (ref 3.5–5.1)
Sodium: 141 mEq/L (ref 135–145)
Total Bilirubin: 0.3 mg/dL (ref 0.2–1.2)
Total Protein: 7.9 g/dL (ref 6.0–8.3)

## 2017-05-06 LAB — LIPID PANEL
Cholesterol: 189 mg/dL (ref 0–200)
HDL: 55 mg/dL (ref >39.00)
LDL Cholesterol: 112 mg/dL — ABNORMAL HIGH (ref 0–99)
NonHDL: 133.94
Total CHOL/HDL Ratio: 3
Triglycerides: 110 mg/dL (ref 0.0–149.0)
VLDL: 22 mg/dL (ref 0.0–40.0)

## 2017-05-06 LAB — TSH: TSH: 3.16 u[IU]/mL (ref 0.35–4.50)

## 2017-05-06 NOTE — Patient Instructions (Signed)
Preventive Care 40-64 Years, Female Preventive care refers to lifestyle choices and visits with your health care provider that can promote health and wellness. What does preventive care include?  A yearly physical exam. This is also called an annual well check.  Dental exams once or twice a year.  Routine eye exams. Ask your health care provider how often you should have your eyes checked.  Personal lifestyle choices, including: ? Daily care of your teeth and gums. ? Regular physical activity. ? Eating a healthy diet. ? Avoiding tobacco and drug use. ? Limiting alcohol use. ? Practicing safe sex. ? Taking low-dose aspirin daily starting at age 58. ? Taking vitamin and mineral supplements as recommended by your health care provider. What happens during an annual well check? The services and screenings done by your health care provider during your annual well check will depend on your age, overall health, lifestyle risk factors, and family history of disease. Counseling Your health care provider may ask you questions about your:  Alcohol use.  Tobacco use.  Drug use.  Emotional well-being.  Home and relationship well-being.  Sexual activity.  Eating habits.  Work and work Statistician.  Method of birth control.  Menstrual cycle.  Pregnancy history.  Screening You may have the following tests or measurements:  Height, weight, and BMI.  Blood pressure.  Lipid and cholesterol levels. These may be checked every 5 years, or more frequently if you are over 81 years old.  Skin check.  Lung cancer screening. You may have this screening every year starting at age 78 if you have a 30-pack-year history of smoking and currently smoke or have quit within the past 15 years.  Fecal occult blood test (FOBT) of the stool. You may have this test every year starting at age 65.  Flexible sigmoidoscopy or colonoscopy. You may have a sigmoidoscopy every 5 years or a colonoscopy  every 10 years starting at age 30.  Hepatitis C blood test.  Hepatitis B blood test.  Sexually transmitted disease (STD) testing.  Diabetes screening. This is done by checking your blood sugar (glucose) after you have not eaten for a while (fasting). You may have this done every 1-3 years.  Mammogram. This may be done every 1-2 years. Talk to your health care provider about when you should start having regular mammograms. This may depend on whether you have a family history of breast cancer.  BRCA-related cancer screening. This may be done if you have a family history of breast, ovarian, tubal, or peritoneal cancers.  Pelvic exam and Pap test. This may be done every 3 years starting at age 80. Starting at age 36, this may be done every 5 years if you have a Pap test in combination with an HPV test.  Bone density scan. This is done to screen for osteoporosis. You may have this scan if you are at high risk for osteoporosis.  Discuss your test results, treatment options, and if necessary, the need for more tests with your health care provider. Vaccines Your health care provider may recommend certain vaccines, such as:  Influenza vaccine. This is recommended every year.  Tetanus, diphtheria, and acellular pertussis (Tdap, Td) vaccine. You may need a Td booster every 10 years.  Varicella vaccine. You may need this if you have not been vaccinated.  Zoster vaccine. You may need this after age 5.  Measles, mumps, and rubella (MMR) vaccine. You may need at least one dose of MMR if you were born in  1959 or later. You may also need a second dose.  Pneumococcal 13-valent conjugate (PCV13) vaccine. You may need this if you have certain conditions and were not previously vaccinated.  Pneumococcal polysaccharide (PPSV23) vaccine. You may need one or two doses if you smoke cigarettes or if you have certain conditions.  Meningococcal vaccine. You may need this if you have certain  conditions.  Hepatitis A vaccine. You may need this if you have certain conditions or if you travel or work in places where you may be exposed to hepatitis A.  Hepatitis B vaccine. You may need this if you have certain conditions or if you travel or work in places where you may be exposed to hepatitis B.  Haemophilus influenzae type b (Hib) vaccine. You may need this if you have certain conditions.  Talk to your health care provider about which screenings and vaccines you need and how often you need them. This information is not intended to replace advice given to you by your health care provider. Make sure you discuss any questions you have with your health care provider. Document Released: 05/06/2015 Document Revised: 12/28/2015 Document Reviewed: 02/08/2015 Elsevier Interactive Patient Education  2018 [employer] Inc[employer]  

## 2017-05-06 NOTE — Progress Notes (Signed)
Subjective:     [name] [name] is a 62 y.o. female and is here for a comprehensive physical exam. 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  its been done before.  she also has a wart like lesion on her L hand that she would like to see  derm for.  no other complaints.  .  Social History   Socioeconomic History  . Marital status: Single    Spouse name: Not on file  . Number of children: 1  . Years of education: Not on file  . Highest education level: Not on file  Social Needs  . Financial resource strain: Not on file  . Food insecurity - worry: Not on file  . Food insecurity - inability: Not on file  . Transportation needs - medical: Not on file  . Transportation needs - non-medical: Not on file  Occupational History  . Occupation: retired    Comment: disabled  Tobacco Use  . Smoking status: Current Every Day Smoker    Packs/day: 0.50    Years: 47.00    Pack years: 23.50    Types: Cigarettes  . Smokeless tobacco: Never Used  . Tobacco comment: Down to 6 cigarettes daily, working on decreaseing even more  Substance and Sexual Activity  . Alcohol use: Yes    Alcohol/week: 0.0 oz    Comment: RARE 1-2 drinks year  . Drug use: No  . Sexual activity: No  Other Topics Concern  . Not on file  Social History Narrative   Single   Adopted daughter   2 Dogs      Exercise-no   Health Maintenance  Topic Date Due  . MAMMOGRAM  02/26/2017  . TETANUS/TDAP  06/06/2023  . COLONOSCOPY  02/24/2025  . INFLUENZA VACCINE  Completed  . Hepatitis C Screening  Completed  . HIV Screening  Completed    The following portions of the patient's history were reviewed and updated as appropriate:  She  has a past medical history of Allergy, Benign tumor, Carcinoid tumor of abdomen, Cataract, Family history of adverse reaction to anesthesia, Lower back pain, Pneumothorax on left, and Pneumothorax on right. She does not  have any pertinent problems on file. She  has a past surgical history that includes Lung surgery (2008?); Oophorectomy; Whipple procedure; LASIK; Tonsillectomy; Pars plana vitrectomy (Left, 08/16/2014); Membrane peel (Left, 08/16/2014); Colonoscopy (2006); Cholecystectomy; Abdominal hysterectomy (04/23/1997); and Eye surgery (Left, 11/2015). Her family history includes Cancer in her father, mother, and sister; Dementia in her sister; Diabetes in her brother; Heart disease in her father and sister. She  reports that she has been smoking cigarettes.  She has a 23.50 pack-year smoking history. she has never used smokeless tobacco. She reports that she drinks alcohol. She reports that she does not use drugs. She has a current medication list which includes the following prescription(s): albuterol, alendronate, budesonide, calcium carbonate-vitamin d, vitamin d3, ipratropium-albuterol, and multivitamins ther. w/minerals. Current Outpatient Medications on File Prior to Visit  Medication Sig Dispense Refill  . albuterol (PROVENTIL HFA;VENTOLIN HFA) 108 (90 Base) MCG/ACT inhaler Inhale 2 puffs into the lungs every 6 (six) hours as needed for wheezing or shortness of breath. 1 Inhaler 2  . alendronate (FOSAMAX) 70 MG tablet TAKE 1 TABLET BY MOUTH EVERY 7 DAYS. TAKE WITH A FULL GLASS OF WATER ON AN EMPTY STOMACH 4 tablet 0  . budesonide (PULMICORT) 0.5 MG/2ML  nebulizer solution Take 2 mLs (0.5 mg total) by nebulization 2 (two) times daily. 120 mL 12  . Calcium Carbonate-Vitamin D (CALTRATE 600+D PO) Take 2 tablets by mouth daily.     . Cholecalciferol (VITAMIN D3) 1000 UNITS CAPS Take 1 capsule by mouth daily.    Marland Kitchen ipratropium-albuterol (DUONEB) 0.5-2.5 (3) MG/3ML SOLN EVERY 4 HOURS AND AS NEEDED 360 mL 5  . Multiple Vitamins-Minerals (MULTIVITAMINS THER. W/MINERALS) TABS Take 1 tablet by mouth daily.    . [DISCONTINUED] zolpidem (AMBIEN) 5 MG tablet Take 5 mg by mouth at bedtime as needed.       No current  facility-administered medications on file prior to visit.    She has No Known Allergies..  Review of Systems Review of Systems  Constitutional: Negative for activity change, appetite change and fatigue.  HENT: Negative for hearing loss, congestion, tinnitus and ear discharge.  dentist q46m Eyes: Negative for visual disturbance (see optho q1y -- vision corrected to 20/20 with glasses).  Respiratory: Negative for cough, chest tightness and shortness of breath.   Cardiovascular: Negative for chest pain, palpitations and leg swelling.  Gastrointestinal: Negative for abdominal pain, diarrhea, constipation and abdominal distention.  Genitourinary: Negative for urgency, frequency, decreased urine volume and difficulty urinating.  Musculoskeletal: Negative for back pain, arthralgias and gait problem.  Skin: Negative for color change, pallor and rash.  Neurological: Negative for dizziness, light-headedness, numbness and headaches.  Hematological: Negative for adenopathy. Does not bruise/bleed easily.  Psychiatric/Behavioral: Negative for suicidal ideas, confusion, sleep disturbance, self-injury, dysphoric mood, decreased concentration and agitation.       Objective:    BP (!) 156/80 (BP Location: Right Arm, Cuff Size: Normal)   Pulse 76   Temp 98 F (36.7 C) (Oral)   Resp 16   Ht 5\' 2"  (1.575 m)   Wt 116 lb 6.4 oz (52.8 kg)   SpO2 98%   BMI 21.29 kg/m  General appearance: alert, cooperative, appears stated age and no distress Head: Normocephalic, without obvious abnormality, atraumatic Eyes: negative findings: lids and lashes normal, conjunctivae and sclerae normal and pupils equal, round, reactive to light and accomodation Ears: normal TM's and external ear canals both ears Nose: Nares normal. Septum midline. Mucosa normal. No drainage or sinus tenderness. Throat: lips, mucosa, and tongue normal; teeth and gums normal Neck: no adenopathy, no carotid bruit, no JVD, supple, symmetrical,  trachea midline and thyroid not enlarged, symmetric, no tenderness/mass/nodules Back: symmetric, no curvature. ROM normal. No CVA tenderness. Lungs: clear to auscultation bilaterally Breasts: normal appearance, no masses or tenderness Heart: regular rate and rhythm, S1, S2 normal, no murmur, click, rub or gallop Abdomen: soft, non-tender; bowel sounds normal; no masses,  no organomegaly Pelvic: not indicated; status post hysterectomy, negative ROS Extremities: extremities normal, atraumatic, no cyanosis or edema Pulses: 2+ and symmetric Skin: + mult neurofibroma , wart-like lesion L hand--palm Lymph nodes: Cervical, supraclavicular, and axillary nodes normal. Neurologic: Alert and oriented X 3, normal strength and tone. Normal symmetric reflexes. Normal coordination and gait    Assessment:    Healthy female exam.      Plan:    ghm utd Check labs See After Visit Summary for Counseling Recommendations    1. Preventative health care See above - POCT Urinalysis Dipstick (Automated) - Comprehensive metabolic panel - CBC with Differential/Platelet - Lipid panel - TSH  2. Neurofibroma of multiple sites [facility])  - Ambulatory referral to Dermatology  3. Skin lesion of hand  - Ambulatory referral to Dermatology

## 2017-08-08 ENCOUNTER — Ambulatory Visit: Payer: BLUE CROSS/BLUE SHIELD

## 2018-01-22 ENCOUNTER — Encounter: Payer: Self-pay | Admitting: *Deleted

## 2018-12-04 ENCOUNTER — Encounter: Payer: Self-pay | Admitting: Family Medicine

## 2018-12-16 ENCOUNTER — Encounter: Payer: Self-pay | Admitting: Family Medicine

## 2018-12-16 NOTE — Telephone Encounter (Signed)
Done

## 2019-01-27 ENCOUNTER — Other Ambulatory Visit: Payer: Self-pay | Admitting: *Deleted

## 2019-01-27 DIAGNOSIS — F17210 Nicotine dependence, cigarettes, uncomplicated: Secondary | ICD-10-CM

## 2019-01-27 DIAGNOSIS — Z87891 Personal history of nicotine dependence: Secondary | ICD-10-CM

## 2019-01-27 DIAGNOSIS — Z122 Encounter for screening for malignant neoplasm of respiratory organs: Secondary | ICD-10-CM

## 2019-02-16 ENCOUNTER — Encounter: Payer: Self-pay | Admitting: Family Medicine

## 2019-02-17 ENCOUNTER — Encounter: Payer: Self-pay | Admitting: Family Medicine

## 2019-02-17 NOTE — Telephone Encounter (Signed)
Letter is in mychart.

## 2019-03-06 ENCOUNTER — Other Ambulatory Visit: Payer: Self-pay

## 2019-03-06 ENCOUNTER — Ambulatory Visit (INDEPENDENT_AMBULATORY_CARE_PROVIDER_SITE_OTHER)
Admission: RE | Admit: 2019-03-06 | Discharge: 2019-03-06 | Disposition: A | Discharge status: Home / Self Care | Payer: PPO | Source: Ambulatory Visit | Attending: Acute Care | Admitting: Acute Care

## 2019-03-06 DIAGNOSIS — F17210 Nicotine dependence, cigarettes, uncomplicated: Secondary | ICD-10-CM | POA: Diagnosis not present

## 2019-03-06 DIAGNOSIS — Z122 Encounter for screening for malignant neoplasm of respiratory organs: Secondary | ICD-10-CM

## 2019-03-06 DIAGNOSIS — Z87891 Personal history of nicotine dependence: Secondary | ICD-10-CM

## 2019-03-10 ENCOUNTER — Telehealth: Payer: Self-pay | Admitting: Acute Care

## 2019-03-10 DIAGNOSIS — F17210 Nicotine dependence, cigarettes, uncomplicated: Secondary | ICD-10-CM

## 2019-03-10 DIAGNOSIS — Z87891 Personal history of nicotine dependence: Secondary | ICD-10-CM

## 2019-03-10 DIAGNOSIS — Z122 Encounter for screening for malignant neoplasm of respiratory organs: Secondary | ICD-10-CM

## 2019-03-11 NOTE — Telephone Encounter (Signed)
Pt informed of CT results per Sarah Groce, NP.  PT verbalized understanding.  Copy sent to PCP.  Order placed for 1 yr f/u CT.  

## 2019-03-15 ENCOUNTER — Encounter: Payer: Self-pay | Admitting: Family Medicine

## 2019-06-25 ENCOUNTER — Other Ambulatory Visit: Payer: Self-pay | Admitting: Family Medicine

## 2019-06-25 DIAGNOSIS — Z1231 Encounter for screening mammogram for malignant neoplasm of breast: Secondary | ICD-10-CM

## 2019-08-12 ENCOUNTER — Other Ambulatory Visit: Payer: Self-pay

## 2019-08-12 ENCOUNTER — Ambulatory Visit: Payer: PPO

## 2019-08-12 ENCOUNTER — Encounter: Payer: Self-pay | Admitting: Family Medicine

## 2019-08-12 ENCOUNTER — Ambulatory Visit
Admission: RE | Admit: 2019-08-12 | Discharge: 2019-08-12 | Disposition: A | Discharge status: Home / Self Care | Payer: PPO | Source: Ambulatory Visit | Attending: Family Medicine | Admitting: Family Medicine

## 2019-08-12 DIAGNOSIS — Z1231 Encounter for screening mammogram for malignant neoplasm of breast: Secondary | ICD-10-CM | POA: Diagnosis not present

## 2019-08-12 NOTE — Telephone Encounter (Signed)
Documented

## 2019-09-22 ENCOUNTER — Telehealth: Payer: Self-pay

## 2019-09-22 ENCOUNTER — Encounter: Payer: Self-pay | Admitting: Family Medicine

## 2019-09-22 ENCOUNTER — Telehealth: Payer: Self-pay | Admitting: Family Medicine

## 2019-09-22 NOTE — Telephone Encounter (Signed)
Pt scheduled for Thursday.

## 2019-09-22 NOTE — Telephone Encounter (Signed)
FYI and we have not seen patient since January 2019. Please advise

## 2019-09-22 NOTE — Telephone Encounter (Signed)
Caller : Mia,NP with [facility]  Call Back # [phone]  Per Mia, Np saw patient this morning for yearly exam and states patient has two abnormal findings   1. 4 PLUS Protein in Urine  2. Abnormal Health Rhythm

## 2019-09-22 NOTE — Telephone Encounter (Signed)
She needs ov

## 2019-09-22 NOTE — Telephone Encounter (Signed)
(  FYI) Patient called in to let Dr. Etter Sjogren know that  Her home health nurse will be doing Lab work and a EKG on her. Per the patient she would like a follow up call from Dr. Etter Sjogren or either the nurse. Please call the patient back at [phone]

## 2019-09-24 ENCOUNTER — Encounter: Payer: Self-pay | Admitting: Family Medicine

## 2019-09-24 ENCOUNTER — Other Ambulatory Visit: Payer: Self-pay

## 2019-09-24 ENCOUNTER — Ambulatory Visit (INDEPENDENT_AMBULATORY_CARE_PROVIDER_SITE_OTHER): Payer: PPO | Admitting: Family Medicine

## 2019-09-24 VITALS — BP 150/80 | HR 86 | Temp 97.4°F | Resp 18 | Ht 62.0 in | Wt 113.8 lb

## 2019-09-24 DIAGNOSIS — R809 Proteinuria, unspecified: Secondary | ICD-10-CM | POA: Diagnosis not present

## 2019-09-24 DIAGNOSIS — I491 Atrial premature depolarization: Secondary | ICD-10-CM | POA: Diagnosis not present

## 2019-09-24 DIAGNOSIS — R0602 Shortness of breath: Secondary | ICD-10-CM

## 2019-09-24 DIAGNOSIS — R002 Palpitations: Secondary | ICD-10-CM | POA: Diagnosis not present

## 2019-09-24 LAB — CBC WITH DIFFERENTIAL/PLATELET
Basophils Absolute: 0.2 10*3/uL — ABNORMAL HIGH (ref 0.0–0.1)
Basophils Relative: 1 % (ref 0.0–3.0)
Eosinophils Absolute: 0.2 10*3/uL (ref 0.0–0.7)
Eosinophils Relative: 1.4 % (ref 0.0–5.0)
HCT: 42.8 % (ref 36.0–46.0)
Hemoglobin: 14.3 g/dL (ref 12.0–15.0)
Lymphocytes Relative: 20.1 % (ref 12.0–46.0)
Lymphs Abs: 3.2 10*3/uL (ref 0.7–4.0)
MCHC: 33.3 g/dL (ref 30.0–36.0)
MCV: 87.8 fl (ref 78.0–100.0)
Monocytes Absolute: 1.2 10*3/uL — ABNORMAL HIGH (ref 0.1–1.0)
Monocytes Relative: 7.8 % (ref 3.0–12.0)
Neutro Abs: 11 10*3/uL — ABNORMAL HIGH (ref 1.4–7.7)
Neutrophils Relative %: 69.7 % (ref 43.0–77.0)
Platelets: 461 10*3/uL — ABNORMAL HIGH (ref 150.0–400.0)
RBC: 4.87 Mil/uL (ref 3.87–5.11)
RDW: 14.5 % (ref 11.5–15.5)
WBC: 15.8 10*3/uL — ABNORMAL HIGH (ref 4.0–10.5)

## 2019-09-24 LAB — POC URINALSYSI DIPSTICK (AUTOMATED)
Bilirubin, UA: NEGATIVE
Blood, UA: NEGATIVE
Glucose, UA: NEGATIVE
Ketones, UA: NEGATIVE
Leukocytes, UA: NEGATIVE
Nitrite, UA: NEGATIVE
Protein, UA: POSITIVE — AB
Spec Grav, UA: 1.015 (ref 1.010–1.025)
Urobilinogen, UA: 0.2 E.U./dL
pH, UA: 6.5 (ref 5.0–8.0)

## 2019-09-24 LAB — COMPREHENSIVE METABOLIC PANEL
ALT: 18 U/L (ref 0–35)
AST: 20 U/L (ref 0–37)
Albumin: 4.2 g/dL (ref 3.5–5.2)
Alkaline Phosphatase: 87 U/L (ref 39–117)
BUN: 14 mg/dL (ref 6–23)
CO2: 27 mEq/L (ref 19–32)
Calcium: 9.9 mg/dL (ref 8.4–10.5)
Chloride: 104 mEq/L (ref 96–112)
Creatinine, Ser: 0.65 mg/dL (ref 0.40–1.20)
GFR: 91.24 mL/min (ref >60.00)
Glucose, Bld: 103 mg/dL — ABNORMAL HIGH (ref 70–99)
Potassium: 5.3 mEq/L — ABNORMAL HIGH (ref 3.5–5.1)
Sodium: 139 mEq/L (ref 135–145)
Total Bilirubin: 0.5 mg/dL (ref 0.2–1.2)
Total Protein: 7.5 g/dL (ref 6.0–8.3)

## 2019-09-24 LAB — LIPID PANEL
Cholesterol: 176 mg/dL (ref 0–200)
HDL: 44.9 mg/dL (ref >39.00)
LDL Cholesterol: 107 mg/dL — ABNORMAL HIGH (ref 0–99)
NonHDL: 131.08
Total CHOL/HDL Ratio: 4
Triglycerides: 120 mg/dL (ref 0.0–149.0)
VLDL: 24 mg/dL (ref 0.0–40.0)

## 2019-09-24 LAB — TSH: TSH: 3.21 u[IU]/mL (ref 0.35–4.50)

## 2019-09-24 LAB — MICROALBUMIN / CREATININE URINE RATIO
Creatinine,U: 66.2 mg/dL
Microalb Creat Ratio: 23.9 mg/g (ref 0.0–30.0)
Microalb, Ur: 15.8 mg/dL — ABNORMAL HIGH (ref 0.0–1.9)

## 2019-09-24 NOTE — Assessment & Plan Note (Signed)
Pac on ekg Check event monitor and echo

## 2019-09-24 NOTE — Assessment & Plan Note (Signed)
Event monitor Echo Refer to cardiology

## 2019-09-24 NOTE — Assessment & Plan Note (Signed)
?   Cardiac vs copd Check echo/ event monitor F/u pulm

## 2019-09-24 NOTE — Patient Instructions (Signed)

## 2019-09-24 NOTE — Progress Notes (Signed)
Patient ID: [name] [name], female    DOB: December 17, 1955  Age: 64 y.o. MRN: [id number]    Subjective:  Subjective  HPI [name] [name] presents for f/u home visit with nurse -----  She was found to have protein in her urine and had an abnormal heart rhythm.    Pt states she has some palpitations but no cp.  + inc sob.  She does have reg visits with pulm for copd.   Review of Systems  Constitutional: Negative for appetite change, chills, diaphoresis, fatigue, fever and unexpected weight change.  HENT: Negative for congestion and hearing loss.   Eyes: Negative for pain, discharge, redness and visual disturbance.  Respiratory: Negative for cough, chest tightness, shortness of breath and wheezing.   Cardiovascular: Positive for palpitations. Negative for chest pain and leg swelling.  Gastrointestinal: Negative for abdominal pain, blood in stool, constipation, diarrhea, nausea and vomiting.  Endocrine: Negative for cold intolerance, heat intolerance, polydipsia, polyphagia and polyuria.  Genitourinary: Negative for difficulty urinating, dysuria, frequency, hematuria and urgency.  Musculoskeletal: Negative for back pain and myalgias.  Skin: Negative for rash.  Allergic/Immunologic: Negative for environmental allergies.  Neurological: Negative for dizziness, weakness, light-headedness, numbness and headaches.  Hematological: Does not bruise/bleed easily.  Psychiatric/Behavioral: Negative for suicidal ideas. The patient is not nervous/anxious.     History Past Medical History:  Diagnosis Date  . Allergy   . 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 Carcinoid tumor of abdomen   . Cataract    both eyes  . Family history of adverse reaction to anesthesia    Mother had trouble waking up  . Lower back pain   . Pneumothorax on left   . Pneumothorax on right     She has a past surgical history that includes Lung surgery (2008?); Oophorectomy; Whipple procedure; LASIK; Tonsillectomy; Pars plana vitrectomy  (Left, 08/16/2014); Membrane peel (Left, 08/16/2014); Colonoscopy (2006); Cholecystectomy; Abdominal hysterectomy (04/23/1997); and Eye surgery (Left, 11/2015).   Her family history includes Cancer in her father, mother, and sister; Dementia in her sister; Diabetes in her brother; Heart disease in her father and sister.She reports that she has been smoking cigarettes. She has a 23.50 pack-year smoking history. She has never used smokeless tobacco. She reports current alcohol use. She reports that she does not use drugs.  Current Outpatient Medications on File Prior to Visit  Medication Sig Dispense Refill  . Cholecalciferol (D3) 50 MCG (2000 UT) TABS     . Multiple Vitamins-Minerals (MULTIVITAMINS THER. W/MINERALS) TABS Take 1 tablet by mouth daily.    . [DISCONTINUED] zolpidem (AMBIEN) 5 MG tablet Take 5 mg by mouth at bedtime as needed.       No current facility-administered medications on file prior to visit.     Objective:  Objective  Physical Exam Vitals and nursing note reviewed.  Constitutional:      Appearance: She is well-developed.  HENT:     Head: Normocephalic and atraumatic.  Eyes:     Conjunctiva/sclera: Conjunctivae normal.  Neck:     Thyroid: No thyromegaly.     Vascular: No carotid bruit or JVD.  Cardiovascular:     Rate and Rhythm: Normal rate. Rhythm irregular.     Heart sounds: Normal heart sounds. No murmur.  Pulmonary:     Effort: Pulmonary effort is normal. No respiratory distress.     Breath sounds: Normal breath sounds. No wheezing or rales.  Chest:     Chest wall: No tenderness.  Musculoskeletal:     Cervical back: Normal range of motion and neck supple.  Neurological:     Mental Status: She is alert and oriented to person, place, and time.    BP (!) 150/80 (BP Location: Left Arm, Patient Position: Sitting, Cuff Size: Normal)   Pulse 86   Temp (!) 97.4 F (36.3 C) (Temporal)   Resp 18   Ht 5\' 2"  (1.575 m)   Wt 113 lb 12.8 oz (51.6 kg)   SpO2 97%    BMI 20.81 kg/m  Wt Readings from Last 3 Encounters:  09/24/19 113 lb 12.8 oz (51.6 kg)  05/06/17 116 lb 6.4 oz (52.8 kg)  04/30/16 125 lb 12.8 oz (57.1 kg)     Lab Results  Component Value Date   WBC 13.7 (H) 05/06/2017   HGB 14.3 05/06/2017   HCT 43.9 05/06/2017   PLT 381.0 05/06/2017   GLUCOSE 104 (H) 05/06/2017   CHOL 189 05/06/2017   TRIG 110.0 05/06/2017   HDL 55.00 05/06/2017   LDLDIRECT 149.2 06/05/2013   LDLCALC 112 (H) 05/06/2017   ALT 15 05/06/2017   AST 18 05/06/2017   NA 141 05/06/2017   K 5.6 (H) 05/06/2017   CL 107 05/06/2017   CREATININE 0.71 05/06/2017   BUN 12 05/06/2017   CO2 29 05/06/2017   TSH 3.16 05/06/2017   INR 0.99 02/29/2012   EKG--PACs , sinus rhythm    MM 3D SCREEN BREAST BILATERAL  Result Date: 08/12/2019 CLINICAL DATA:  Screening. EXAM: DIGITAL SCREENING BILATERAL MAMMOGRAM WITH TOMO AND CAD COMPARISON:  Previous exam(s). ACR Breast Density Category b: There are scattered areas of fibroglandular density. FINDINGS: There are no findings suspicious for malignancy. Images were processed with CAD. IMPRESSION: No mammographic evidence of malignancy. A result letter of this screening mammogram will be mailed directly to the patient. RECOMMENDATION: Screening mammogram in one year. (Code:[id number]-B-01Y) BI-RADS CATEGORY  1: Negative. Electronically Signed   By: Lovey Newcomer M.D.   On: 08/12/2019 07:42     Assessment & Plan:  Plan  I have discontinued [name] [name]'s Calcium Carbonate-Vitamin D (CALTRATE 600+D PO), Vitamin D3, budesonide, albuterol, ipratropium-albuterol, and alendronate. I am also having her maintain her multivitamins ther. w/minerals and D3.  No orders of the defined types were placed in this encounter.   Problem List Items Addressed This Visit      Unprioritized   PAC (premature atrial contraction)    Event monitor Echo Refer to cardiology       Relevant Orders   Cardiac event monitor   Ambulatory referral to Cardiology    Palpitations    Pac on ekg Check event monitor and echo      Relevant Orders   EKG 12-Lead (Completed)   ECHOCARDIOGRAM COMPLETE   TSH   Lipid panel   CBC with Differential/Platelet   Comprehensive metabolic panel   Cardiac event monitor   Ambulatory referral to Cardiology   Proteinuria    Recheck today      Relevant Orders   Microalbumin / creatinine urine ratio   POCT Urinalysis Dipstick (Automated)   SOB (shortness of breath) - Primary    ? Cardiac vs copd Check echo/ event monitor F/u pulm      Relevant Orders   EKG 12-Lead (Completed)   ECHOCARDIOGRAM COMPLETE   TSH   Lipid panel   CBC with Differential/Platelet   Comprehensive metabolic panel   Cardiac event monitor   Ambulatory referral to Cardiology  Follow-up: Return if symptoms worsen or fail to improve, for bp check .  [name], DO

## 2019-09-24 NOTE — Assessment & Plan Note (Signed)
Recheck today. 

## 2019-09-27 DIAGNOSIS — Z7189 Other specified counseling: Secondary | ICD-10-CM | POA: Insufficient documentation

## 2019-09-27 NOTE — Progress Notes (Signed)
Cardiology Office Note   Date:  09/28/2019   ID:  RAMANDEEP ARINGTON, DOB 1955-11-21, MRN [id number]  PCP:  [name], DO  Cardiologist:   No primary care provider on file. Referring:  [name], DO  Chief Complaint  Patient presents with  . Palpitations      History of Present Illness: [name] [name] is a 64 y.o. female who is referred by Carollee Herter, Alferd Apa, DO for evaluation of SOB and palpitations     She did have an echo in 2017 and had NL LV function.   Patient has no prior cardiac history.  She says that for a couple of weeks she has felt her heart beating fast.  She feels it beating in her chest on the left side.  It can last for hours at a time.  It is irregular and strong.  She does not describe it as rapid beats.  It can last throughout the day.  She cannot bring it on.  There is no trigger.  She is not having it at night when she tries to go to sleep.  A monitor has been ordered and she is waiting for this.  I do see that her thyroid was checked a couple of years ago but I do not see that this has been reordered.  She has not had any chest pressure, neck or arm discomfort.  She does get short of breath with mild activity such as walking to the mailbox 25 yards and coming back 25 yards.  She describes this to her smoking and COPD.  She still smokes 1/2 pack/day.  Past Medical History:  Diagnosis Date  . Allergy   . 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 Carcinoid tumor of abdomen   . Cataract    both eyes  . Family history of adverse reaction to anesthesia    Mother had trouble waking up  . Lower back pain   . Neurofibromatosis, type I (von Recklinghausen's disease) ([place])   . Pneumothorax on left   . Pneumothorax on right     Past Surgical History:  Procedure Laterality Date  . ABDOMINAL HYSTERECTOMY  04/23/1997   fibroids  TAH/ BSO  . CHOLECYSTECTOMY    . COLONOSCOPY  2006  . EYE SURGERY Left 11/2015   cataract and astigmatism surgery with  Intraocular Lens left eye  . LASIK    . LUNG SURGERY  2008?   d/t pneumothroax  . MEMBRANE PEEL Left 08/16/2014   Procedure: MEMBRANE PEEL LEFT EYE ;  Surgeon: Sherlynn Stalls, MD;  Location: [facility];  Service: Ophthalmology;  Laterality: Left;  . OOPHORECTOMY    . PARS PLANA VITRECTOMY Left 08/16/2014   Procedure: PARS PLANA VITRECTOMY WITH 25 GAUGE;  Surgeon: Sherlynn Stalls, MD;  Location: [place];  Service: Ophthalmology;  Laterality: Left;  . TONSILLECTOMY    . WHIPPLE PROCEDURE       Current Outpatient Medications  Medication Sig Dispense Refill  . Cholecalciferol (D3) 50 MCG (2000 UT) TABS     . Multiple Vitamins-Minerals (MULTIVITAMINS THER. W/MINERALS) TABS Take 1 tablet by mouth daily.    . metoprolol tartrate (LOPRESSOR) 25 MG tablet Take 0.5 tablets (12.5 mg total) by mouth 2 (two) times daily. 90 tablet 3   No current facility-administered medications for this visit.    Allergies:   Patient has no known allergies.    Social History:  The patient  reports that she  has been smoking cigarettes. She has a 23.50 pack-year smoking history. She has never used smokeless tobacco. She reports current alcohol use. She reports that she does not use drugs.   Family History:  The patient's family history includes Cancer in her father, mother, and sister; Dementia in her sister; Diabetes in her brother; Heart disease in her father and sister.    ROS:  Please see the history of present illness.   Otherwise, review of systems are positive for none.   All other systems are reviewed and negative.    PHYSICAL EXAM: VS:  BP 138/84   Pulse 90   Temp (!) 97.2 F (36.2 C)   Ht 5' 2.5" (1.588 m)   Wt 114 lb (51.7 kg)   SpO2 95%   BMI 20.52 kg/m  , BMI Body mass index is 20.52 kg/m. GENERAL:  Well appearing HEENT:  Pupils equal round and reactive, fundi not visualized, oral mucosa unremarkable NECK:  No jugular venous distention, waveform within normal limits, carotid upstroke brisk and  symmetric, no bruits, no thyromegaly LYMPHATICS:  No cervical, inguinal adenopathy LUNGS:  Clear to auscultation bilaterally BACK:  No CVA tenderness CHEST:  Unremarkable HEART:  PMI not displaced or sustained,S1 and S2 within normal limits, no S3, no S4, no clicks, no rubs, no murmurs ABD:  Flat, positive bowel sounds normal in frequency in pitch, no bruits, no rebound, no guarding, no midline pulsatile mass, no hepatomegaly, no splenomegaly EXT:  2 plus pulses throughout, no edema, no cyanosis no clubbing SKIN:  No rashes no nodules NEURO:  Cranial nerves II through XII grossly intact, motor grossly intact throughout PSYCH:  Cognitively intact, oriented to person place and time    EKG:  EKG is ordered today. The ekg ordered today demonstrates sinus rhythm, rate 90, axis within normal limits, intervals within normal limits, poor anterior R wave progression, premature atrial contractions.   Recent Labs: 09/24/2019: ALT 18; BUN 14; Creatinine, Ser 0.65; Hemoglobin 14.3; Platelets 461.0; Potassium 5.3; Sodium 139; TSH 3.21    Lipid Panel    Component Value Date/Time   CHOL 176 09/24/2019 0921   TRIG 120.0 09/24/2019 0921   HDL 44.90 09/24/2019 0921   CHOLHDL 4 09/24/2019 0921   VLDL 24.0 09/24/2019 0921   LDLCALC 107 (H) 09/24/2019 0921   LDLDIRECT 149.2 06/05/2013 1104      Wt Readings from Last 3 Encounters:  09/28/19 114 lb (51.7 kg)  09/24/19 113 lb 12.8 oz (51.6 kg)  05/06/17 116 lb 6.4 oz (52.8 kg)      Other studies Reviewed: Additional studies/ records that were reviewed today include: None. Review of the above records demonstrates:  Please see elsewhere in the note.     ASSESSMENT AND PLAN:  CORONARY CALCIUM:   The patient did have coronary calcium noted on the CT.  I am going to screen her because of this with a POET (Plain Old Exercise Treadmill)  PALPITATION:    The patient has PACs which may be what she is describing.  I am going to give her metoprolol  12-1/2 mg twice daily.  She is having a monitor placed.  Apparently also an echo has been ordered.  COVID EDUCATION: The patient has had her vaccine.  Current medicines are reviewed at length with the patient today.  The patient does not have concerns regarding medicines.  The following changes have been made:  As above  Labs/ tests ordered today include:   Orders Placed This Encounter  Procedures  .  EXERCISE TOLERANCE TEST (ETT)  . EKG 12-Lead     Disposition:   FU with me as needed.      Signed, Minus Breeding, MD  09/28/2019 1:10 PM    [facility]

## 2019-09-28 ENCOUNTER — Encounter: Payer: Self-pay | Admitting: Cardiology

## 2019-09-28 ENCOUNTER — Ambulatory Visit: Payer: PPO | Admitting: Cardiology

## 2019-09-28 ENCOUNTER — Other Ambulatory Visit: Payer: Self-pay

## 2019-09-28 VITALS — BP 138/84 | HR 90 | Temp 97.2°F | Ht 62.5 in | Wt 114.0 lb

## 2019-09-28 DIAGNOSIS — R931 Abnormal findings on diagnostic imaging of heart and coronary circulation: Secondary | ICD-10-CM | POA: Diagnosis not present

## 2019-09-28 DIAGNOSIS — Z7189 Other specified counseling: Secondary | ICD-10-CM | POA: Diagnosis not present

## 2019-09-28 DIAGNOSIS — R002 Palpitations: Secondary | ICD-10-CM

## 2019-09-28 DIAGNOSIS — R0602 Shortness of breath: Secondary | ICD-10-CM | POA: Diagnosis not present

## 2019-09-28 MED ORDER — METOPROLOL TARTRATE 25 MG PO TABS: 12.5000 mg | ORAL_TABLET | Freq: Two times a day (BID) | ORAL | 3 refills | Status: AC

## 2019-09-28 NOTE — Patient Instructions (Signed)
Medication Instructions:  START METOPROLOL TARTRATE 12.5mg  TWICE A DAY *If you need a refill on your cardiac medications before your next appointment, please call your pharmacy*  Lab Work: None ordered this visit  Testing/Procedures: Your physician has requested that you have an exercise tolerance test. For further information please visit [email or web address]. Please also follow instruction sheet, as given.  You will need a Covid Screening 3 days prior to your exercise tolerance test. You will need to self quarantine after the screening until your procedure. This is a Drive Up Visit at the [facility] [address], [place]. Someone will direct you to the appropriate testing line. Stay in your car and someone will be with you shortly  Follow-Up: At [facility], you and your health needs are our priority.  As part of our continuing mission to provide you with exceptional heart care, we have created designated Provider Care Teams.  These Care Teams include your primary Cardiologist (physician) and Advanced Practice Providers (APPs -  Physician Assistants and Nurse Practitioners) who all work together to provide you with the care you need, when you need it.  Your next appointment:   FOLLOW UP AS NEEDED

## 2019-10-07 ENCOUNTER — Encounter (INDEPENDENT_AMBULATORY_CARE_PROVIDER_SITE_OTHER): Payer: PPO

## 2019-10-07 DIAGNOSIS — I491 Atrial premature depolarization: Secondary | ICD-10-CM

## 2019-10-07 DIAGNOSIS — R0602 Shortness of breath: Secondary | ICD-10-CM

## 2019-10-07 DIAGNOSIS — R002 Palpitations: Secondary | ICD-10-CM | POA: Diagnosis not present

## 2019-10-07 DIAGNOSIS — I493 Ventricular premature depolarization: Secondary | ICD-10-CM | POA: Diagnosis not present

## 2019-10-13 ENCOUNTER — Other Ambulatory Visit (HOSPITAL_COMMUNITY): Payer: PPO

## 2019-10-14 ENCOUNTER — Telehealth (HOSPITAL_COMMUNITY): Payer: Self-pay

## 2019-10-14 NOTE — Telephone Encounter (Signed)
Encounter complete. 

## 2019-10-15 ENCOUNTER — Other Ambulatory Visit: Payer: Self-pay

## 2019-10-15 ENCOUNTER — Telehealth (HOSPITAL_COMMUNITY): Payer: Self-pay

## 2019-10-15 ENCOUNTER — Ambulatory Visit (HOSPITAL_BASED_OUTPATIENT_CLINIC_OR_DEPARTMENT_OTHER): Payer: PPO

## 2019-10-15 DIAGNOSIS — R0602 Shortness of breath: Secondary | ICD-10-CM | POA: Diagnosis not present

## 2019-10-15 DIAGNOSIS — R002 Palpitations: Secondary | ICD-10-CM

## 2019-10-15 DIAGNOSIS — R931 Abnormal findings on diagnostic imaging of heart and coronary circulation: Secondary | ICD-10-CM | POA: Diagnosis not present

## 2019-10-15 NOTE — Telephone Encounter (Signed)
Encounter complete. 

## 2019-10-16 ENCOUNTER — Encounter (HOSPITAL_COMMUNITY): Payer: Self-pay | Admitting: *Deleted

## 2019-10-16 ENCOUNTER — Ambulatory Visit (HOSPITAL_COMMUNITY)
Admission: RE | Admit: 2019-10-16 | Discharge: 2019-10-16 | Disposition: A | Discharge status: Home / Self Care | Payer: PPO | Source: Ambulatory Visit | Attending: Cardiology | Admitting: Cardiology

## 2019-10-16 DIAGNOSIS — R931 Abnormal findings on diagnostic imaging of heart and coronary circulation: Secondary | ICD-10-CM | POA: Insufficient documentation

## 2019-10-16 DIAGNOSIS — R9431 Abnormal electrocardiogram [ECG] [EKG]: Secondary | ICD-10-CM

## 2019-10-16 DIAGNOSIS — R0602 Shortness of breath: Secondary | ICD-10-CM | POA: Insufficient documentation

## 2019-10-16 DIAGNOSIS — R002 Palpitations: Secondary | ICD-10-CM | POA: Insufficient documentation

## 2019-10-16 LAB — EXERCISE TOLERANCE TEST
Estimated workload: 2.8 METS
Exercise duration (min): 1 min
Exercise duration (sec): 0 s
MPHR: 155 {beats}/min
Peak HR: 151 {beats}/min
Percent HR: 97 %
Rest HR: 111 {beats}/min

## 2019-10-16 NOTE — Progress Notes (Unsigned)
Abnormal ETT was reviewed by Dr. Percival Spanish. Patient was given the ok to be discharged to go home.

## 2019-10-19 ENCOUNTER — Telehealth: Payer: Self-pay | Admitting: *Deleted

## 2019-10-19 DIAGNOSIS — Z8679 Personal history of other diseases of the circulatory system: Secondary | ICD-10-CM

## 2019-10-19 DIAGNOSIS — R0602 Shortness of breath: Secondary | ICD-10-CM

## 2019-10-19 NOTE — Telephone Encounter (Signed)
Advised patient, verbalized understanding  Message sent to schedulers to arrange

## 2019-10-19 NOTE — Telephone Encounter (Signed)
-----   Message from Minus Breeding, MD sent at 10/18/2019 12:32 PM EDT ----- She was not able to complete a POET (Plain Old Exercise Treadmill) to achieve a target heart rate.  Given the dyspnea and coronary calcium she needs a [employer].  Call Ms. Dunkel with the results and send results to Carollee Herter, Alferd Apa, DO

## 2019-10-20 ENCOUNTER — Telehealth (HOSPITAL_COMMUNITY): Payer: Self-pay

## 2019-10-20 NOTE — Telephone Encounter (Signed)
Encounter complete. 

## 2019-10-21 ENCOUNTER — Other Ambulatory Visit: Payer: Self-pay

## 2019-10-21 ENCOUNTER — Ambulatory Visit (HOSPITAL_COMMUNITY)
Admission: RE | Admit: 2019-10-21 | Discharge: 2019-10-21 | Disposition: A | Discharge status: Home / Self Care | Payer: PPO | Source: Ambulatory Visit | Attending: Cardiovascular Disease | Admitting: Cardiovascular Disease

## 2019-10-21 DIAGNOSIS — Z8679 Personal history of other diseases of the circulatory system: Secondary | ICD-10-CM | POA: Diagnosis not present

## 2019-10-21 DIAGNOSIS — R0602 Shortness of breath: Secondary | ICD-10-CM | POA: Diagnosis not present

## 2019-10-21 LAB — MYOCARDIAL PERFUSION IMAGING
LV dias vol: 50 mL (ref 46–106)
LV sys vol: 13 mL
Peak HR: 93 {beats}/min
Rest HR: 74 {beats}/min
SDS: 1
SRS: 1
SSS: 2
TID: 1.06

## 2019-10-21 MED ORDER — REGADENOSON 0.4 MG/5ML IV SOLN
0.4000 mg | Freq: Once | INTRAVENOUS | Status: AC
Start: 2019-10-21 — End: 2019-10-21
  Administered 2019-10-21: 0.4 mg via INTRAVENOUS

## 2019-10-21 MED ORDER — TECHNETIUM TC 99M TETROFOSMIN IV KIT
10.9000 | PACK | Freq: Once | INTRAVENOUS | Status: AC | PRN
  Administered 2019-10-21: 10.9 via INTRAVENOUS
  Filled 2019-10-21: qty 11

## 2019-10-21 MED ORDER — AMINOPHYLLINE 25 MG/ML IV SOLN
75.0000 mg | Freq: Once | INTRAVENOUS | Status: AC
Start: 2019-10-21 — End: 2019-10-21
  Administered 2019-10-21: 75 mg via INTRAVENOUS

## 2019-10-21 MED ORDER — TECHNETIUM TC 99M TETROFOSMIN IV KIT
30.8000 | PACK | Freq: Once | INTRAVENOUS | Status: AC | PRN
  Administered 2019-10-21: 30.8 via INTRAVENOUS
  Filled 2019-10-21: qty 31

## 2019-10-26 ENCOUNTER — Encounter: Payer: Self-pay | Admitting: Family Medicine

## 2019-10-27 NOTE — Telephone Encounter (Signed)
Cardiology put it on her (or sent it to her)    Call the cardiologist

## 2019-10-27 NOTE — Telephone Encounter (Signed)
See picture attached.

## 2019-11-11 ENCOUNTER — Other Ambulatory Visit: Payer: PPO

## 2019-11-11 ENCOUNTER — Encounter: Payer: Self-pay | Admitting: Internal Medicine

## 2019-11-11 ENCOUNTER — Ambulatory Visit (HOSPITAL_BASED_OUTPATIENT_CLINIC_OR_DEPARTMENT_OTHER)
Admission: RE | Admit: 2019-11-11 | Discharge: 2019-11-11 | Disposition: A | Discharge status: Home / Self Care | Payer: PPO | Source: Ambulatory Visit | Attending: Internal Medicine | Admitting: Internal Medicine

## 2019-11-11 ENCOUNTER — Other Ambulatory Visit: Payer: Self-pay

## 2019-11-11 ENCOUNTER — Ambulatory Visit (INDEPENDENT_AMBULATORY_CARE_PROVIDER_SITE_OTHER): Payer: PPO | Admitting: Internal Medicine

## 2019-11-11 VITALS — BP 138/82 | HR 110 | Temp 97.7°F | Resp 18 | Ht 62.5 in | Wt 108.2 lb

## 2019-11-11 DIAGNOSIS — M549 Dorsalgia, unspecified: Secondary | ICD-10-CM

## 2019-11-11 DIAGNOSIS — R1012 Left upper quadrant pain: Secondary | ICD-10-CM | POA: Insufficient documentation

## 2019-11-11 DIAGNOSIS — M545 Low back pain: Secondary | ICD-10-CM | POA: Diagnosis not present

## 2019-11-11 DIAGNOSIS — R918 Other nonspecific abnormal finding of lung field: Secondary | ICD-10-CM | POA: Diagnosis not present

## 2019-11-11 DIAGNOSIS — M47814 Spondylosis without myelopathy or radiculopathy, thoracic region: Secondary | ICD-10-CM | POA: Diagnosis not present

## 2019-11-11 DIAGNOSIS — M4186 Other forms of scoliosis, lumbar region: Secondary | ICD-10-CM | POA: Diagnosis not present

## 2019-11-11 DIAGNOSIS — M47816 Spondylosis without myelopathy or radiculopathy, lumbar region: Secondary | ICD-10-CM | POA: Diagnosis not present

## 2019-11-11 DIAGNOSIS — J438 Other emphysema: Secondary | ICD-10-CM | POA: Diagnosis not present

## 2019-11-11 LAB — URINALYSIS, ROUTINE W REFLEX MICROSCOPIC
Bilirubin Urine: NEGATIVE
Hgb urine dipstick: NEGATIVE
Ketones, ur: NEGATIVE
Leukocytes,Ua: NEGATIVE
Nitrite: NEGATIVE
RBC / HPF: NONE SEEN (ref 0–?)
Specific Gravity, Urine: <=1.005 — AB (ref 1.000–1.030)
Total Protein, Urine: NEGATIVE
Urine Glucose: NEGATIVE
Urobilinogen, UA: 0.2 (ref 0.0–1.0)
pH: 6.5 (ref 5.0–8.0)

## 2019-11-11 LAB — HEPATIC FUNCTION PANEL
ALT: 14 U/L (ref 0–35)
AST: 22 U/L (ref 0–37)
Albumin: 3.9 g/dL (ref 3.5–5.2)
Alkaline Phosphatase: 78 U/L (ref 39–117)
Bilirubin, Direct: 0.1 mg/dL (ref 0.0–0.3)
Total Bilirubin: 0.4 mg/dL (ref 0.2–1.2)
Total Protein: 7.6 g/dL (ref 6.0–8.3)

## 2019-11-11 LAB — CBC WITH DIFFERENTIAL/PLATELET
Basophils Absolute: 0.1 10*3/uL (ref 0.0–0.1)
Basophils Relative: 0.7 % (ref 0.0–3.0)
Eosinophils Absolute: 0.1 10*3/uL (ref 0.0–0.7)
Eosinophils Relative: 0.6 % (ref 0.0–5.0)
HCT: 44.8 % (ref 36.0–46.0)
Hemoglobin: 14.6 g/dL (ref 12.0–15.0)
Lymphocytes Relative: 16.5 % (ref 12.0–46.0)
Lymphs Abs: 2.7 10*3/uL (ref 0.7–4.0)
MCHC: 32.7 g/dL (ref 30.0–36.0)
MCV: 86.2 fl (ref 78.0–100.0)
Monocytes Absolute: 1.2 10*3/uL — ABNORMAL HIGH (ref 0.1–1.0)
Monocytes Relative: 7.1 % (ref 3.0–12.0)
Neutro Abs: 12.4 10*3/uL — ABNORMAL HIGH (ref 1.4–7.7)
Neutrophils Relative %: 75.1 % (ref 43.0–77.0)
Platelets: 480 10*3/uL — ABNORMAL HIGH (ref 150.0–400.0)
RBC: 5.2 Mil/uL — ABNORMAL HIGH (ref 3.87–5.11)
RDW: 14.1 % (ref 11.5–15.5)
WBC: 16.5 10*3/uL — ABNORMAL HIGH (ref 4.0–10.5)

## 2019-11-11 LAB — AMYLASE: Amylase: 19 U/L — ABNORMAL LOW (ref 27–131)

## 2019-11-11 LAB — LIPASE: Lipase: 6 U/L — ABNORMAL LOW (ref 11.0–59.0)

## 2019-11-11 MED ORDER — HYDROCODONE-ACETAMINOPHEN 5-325 MG PO TABS: 1.0000 | ORAL_TABLET | Freq: Every evening | ORAL | 0 refills | Status: DC | PRN

## 2019-11-11 NOTE — Telephone Encounter (Signed)
-----   Message from Colon Branch, MD sent at 11/11/2019  8:39 AM EDT ----- Vicodin 5/325 2 tablets p.o. nightly as needed pain #12.  No refills

## 2019-11-11 NOTE — Progress Notes (Signed)
Pre visit review using our clinic review tool, if applicable. No additional management support is needed unless otherwise documented below in the visit note. 

## 2019-11-11 NOTE — Progress Notes (Signed)
Subjective:    Patient ID: [name] [name], female    DOB: Nov 28, 1955, 64 y.o.   MRN: [id number]  DOS:  11/11/2019 Type of visit - description: Acute visit 2 weeks history of pain located at the left side of the lower anterior and posterior thorax, see graphic for location. The pain is a steady but increased with certain movements. Denies any rash. No injury. Symptoms of started rather suddenly "just started one day".    Review of Systems No fever chills Shortness of breath increased for the last few weeks. Having palpitations on and off History of COPD, cough and sputum production at baseline.  No hemoptysis No dysuria, gross hematuria or difficulty urinating. Denies paresthesias of the lower extremities, no bladder or bowel incontinence.  Past Medical History:  Diagnosis Date  . Allergy   . 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 Carcinoid tumor of abdomen   . Cataract    both eyes  . Family history of adverse reaction to anesthesia    Mother had trouble waking up  . Lower back pain   . Neurofibromatosis, type I (von Recklinghausen's disease) ([place])   . Pneumothorax on left   . Pneumothorax on right     Past Surgical History:  Procedure Laterality Date  . ABDOMINAL HYSTERECTOMY  04/23/1997   fibroids  TAH/ BSO  . CHOLECYSTECTOMY    . COLONOSCOPY  2006  . EYE SURGERY Left 11/2015   cataract and astigmatism surgery with Intraocular Lens left eye  . LASIK    . LUNG SURGERY  2008?   d/t pneumothroax  . MEMBRANE PEEL Left 08/16/2014   Procedure: MEMBRANE PEEL LEFT EYE ;  Surgeon: Sherlynn Stalls, MD;  Location: [place];  Service: Ophthalmology;  Laterality: Left;  . OOPHORECTOMY    . PARS PLANA VITRECTOMY Left 08/16/2014   Procedure: PARS PLANA VITRECTOMY WITH 25 GAUGE;  Surgeon: Sherlynn Stalls, MD;  Location: [place];  Service: Ophthalmology;  Laterality: Left;  . TONSILLECTOMY    . WHIPPLE PROCEDURE      Allergies as of 11/11/2019   No Known Allergies     Medication List         Accurate as of November 11, 2019  8:07 AM. If you have any questions, ask your nurse or doctor.        STOP taking these medications   zolpidem 5 MG tablet Commonly known as: AMBIEN Stopped by: Kathlene November, MD     TAKE these medications   D3 50 MCG (2000 UT) Tabs Generic drug: Cholecalciferol   metoprolol tartrate 25 MG tablet Commonly known as: LOPRESSOR Take 0.5 tablets (12.5 mg total) by mouth 2 (two) times daily.   multivitamins ther. w/minerals Tabs tablet Take 1 tablet by mouth daily.          Objective:   Physical Exam Skin:        BP 138/82 (BP Location: Left Arm, Patient Position: Sitting, Cuff Size: Small)   Pulse (!) 110   Temp 97.7 F (36.5 C) (Oral)   Resp 18   Ht 5' 2.5" (1.588 m)   Wt 108 lb 4 oz (49.1 kg)   SpO2 92%   BMI 19.48 kg/m  General:   Well developed, NAD, BMI noted.  HEENT:  Normocephalic . Face symmetric, atraumatic Lungs:  CTA B Normal respiratory effort, no intercostal retractions, no accessory muscle use. Heart: Occasional irregular beats,  no murmur.  Abdomen:  Not distended, soft, slightly tender at  the left side without mass or rebound Palpable nontender aorta without bruit MSK: No TTP of the thoracic spine. Skin: Findings consistent with neurofibromatosis Lower extremities: no pretibial edema bilaterally  Neurologic:  alert & oriented X3.  Speech normal, gait appropriate for age and unassisted.  Motor and DTR symmetric Psych--  Cognition and judgment appear intact.  Cooperative with normal attention span and concentration.  Behavior appropriate. No anxious or depressed appearing.     Assessment    64 year old female, PMH includes  COPD, GERD, neuroendocrine tumor of the duodenum, neurofibromatosis, osteopenia,  palpitations (recent negative Myoview and an echo with diastolic dysfunction)  presents with:  Left-sided  chest pain/abd pain ( location: See graphic) She has a history of neuro endocrine tumor status post  [facility], neurofibromatosis, abnormal MRI of the thoracic spine in 2015  (see full report, cystic lesion on the left of T8-9 with some suggestive of inflammatory process.  Also foraminal narrowing on the left T7-8), status post hysterectomy and cholecystectomy. Clinically pain seems MSK. GI, GU ROS negative. Exam is benign, did have a palpable nontender aorta. We will start a work-up with UA-UCX, CBC, amylase, lipase, LFTs. Also a x-ray of the thoracic and lumbar spine to rule out an acute process Abdominal ultrasound Further advised with results To let us know if pain suddenly increase or change.   This visit occurred during the SARS-CoV-2 public health emergency.  Safety protocols were in place, including screening questions prior to the visit, additional usage of staff PPE, and extensive cleaning of exam room while observing appropriate contact time as indicated for disinfecting solutions.

## 2019-11-11 NOTE — Patient Instructions (Addendum)
Happy Early Rudene Anda!    GO TO THE LAB : Get the blood work Administrator, [employer] a urine sample at your earliest [place], [place] back for a checkup in 10 days       STOP BY THE FIRST FLOOR:  get the XR and schedule ultrasound of your abdomen   Call or go to the ER if severe pain, fever, chills, rash, nausea, vomiting, change in the color of the stools

## 2019-11-11 NOTE — Telephone Encounter (Signed)
Dr Larose Kells asked me to refill her hydrocodone as his [employer] ID is not working right now- in the process of getting this fixed  Meds ordered this encounter  Medications  . HYDROcodone-acetaminophen (NORCO/VICODIN) 5-325 MG tablet    Sig: Take 1-2 tablets by mouth at bedtime as needed for up to 6 days.    Dispense:  12 tablet    Refill:  0

## 2019-11-12 ENCOUNTER — Other Ambulatory Visit: Payer: Self-pay | Admitting: Internal Medicine

## 2019-11-12 ENCOUNTER — Telehealth: Payer: Self-pay

## 2019-11-12 ENCOUNTER — Telehealth: Payer: Self-pay | Admitting: Acute Care

## 2019-11-12 ENCOUNTER — Ambulatory Visit (HOSPITAL_BASED_OUTPATIENT_CLINIC_OR_DEPARTMENT_OTHER)
Admission: RE | Admit: 2019-11-12 | Discharge: 2019-11-12 | Disposition: A | Discharge status: Home / Self Care | Payer: PPO | Source: Ambulatory Visit | Attending: Internal Medicine | Admitting: Internal Medicine

## 2019-11-12 ENCOUNTER — Other Ambulatory Visit (INDEPENDENT_AMBULATORY_CARE_PROVIDER_SITE_OTHER): Payer: PPO

## 2019-11-12 ENCOUNTER — Telehealth: Payer: Self-pay | Admitting: Internal Medicine

## 2019-11-12 ENCOUNTER — Encounter (HOSPITAL_BASED_OUTPATIENT_CLINIC_OR_DEPARTMENT_OTHER): Payer: Self-pay

## 2019-11-12 ENCOUNTER — Other Ambulatory Visit: Payer: Self-pay

## 2019-11-12 DIAGNOSIS — J181 Lobar pneumonia, unspecified organism: Secondary | ICD-10-CM | POA: Diagnosis not present

## 2019-11-12 DIAGNOSIS — J439 Emphysema, unspecified: Secondary | ICD-10-CM | POA: Diagnosis not present

## 2019-11-12 DIAGNOSIS — R937 Abnormal findings on diagnostic imaging of other parts of musculoskeletal system: Secondary | ICD-10-CM

## 2019-11-12 DIAGNOSIS — R918 Other nonspecific abnormal finding of lung field: Secondary | ICD-10-CM | POA: Diagnosis not present

## 2019-11-12 DIAGNOSIS — J984 Other disorders of lung: Secondary | ICD-10-CM | POA: Diagnosis not present

## 2019-11-12 DIAGNOSIS — M47814 Spondylosis without myelopathy or radiculopathy, thoracic region: Secondary | ICD-10-CM | POA: Diagnosis not present

## 2019-11-12 HISTORY — DX: Essential (primary) hypertension: I10

## 2019-11-12 LAB — URINE CULTURE
MICRO NUMBER:: 10732033
SPECIMEN QUALITY:: ADEQUATE

## 2019-11-12 LAB — BASIC METABOLIC PANEL
BUN: 13 mg/dL (ref 6–23)
CO2: 24 mEq/L (ref 19–32)
Calcium: 9.6 mg/dL (ref 8.4–10.5)
Chloride: 101 mEq/L (ref 96–112)
Creatinine, Ser: 0.63 mg/dL (ref 0.40–1.20)
GFR: 94.55 mL/min (ref >60.00)
Glucose, Bld: 105 mg/dL — ABNORMAL HIGH (ref 70–99)
Potassium: 4.1 mEq/L (ref 3.5–5.1)
Sodium: 136 mEq/L (ref 135–145)

## 2019-11-12 MED ORDER — IOHEXOL 300 MG/ML  SOLN
100.0000 mL | Freq: Once | INTRAMUSCULAR | Status: AC | PRN
  Administered 2019-11-12: 75 mL via INTRAVENOUS

## 2019-11-12 MED ORDER — HYDROCODONE-ACETAMINOPHEN 5-325 MG PO TABS: 1.0000 | ORAL_TABLET | Freq: Three times a day (TID) | ORAL | 0 refills | Status: DC | PRN

## 2019-11-12 NOTE — Telephone Encounter (Signed)
[name] [name] w/ [facility] called regarding thoracic spine x-ray results.   Dense consolidation and possible effusion at the left lung base, superimposed upon severe background emphysema. Dedicated chest imaging with either two-view chest x-ray or chest CT is recommended.  Questions can be called to [phone].

## 2019-11-12 NOTE — Telephone Encounter (Signed)
Dr. Melvyn Novas, Dr. Larose Kells would like you to call him. Dr. Larose Kells [phone]

## 2019-11-12 NOTE — Telephone Encounter (Signed)
Dr. Larose Kells updated and would like Dr. Melvyn Novas to call him back.

## 2019-11-12 NOTE — Telephone Encounter (Signed)
Spoke w/ Pt- informed of results and recommendations. Pt verbalized understanding. CT chest w/ placed.

## 2019-11-12 NOTE — Telephone Encounter (Signed)
Advise patient Blood work looks good except for elevated white count which is chronic. X-rays show shadow on the long, Arrange for a CT chest with contrast, DX abnormal chest x-ray.  (Discussed with radiology) Please proceed with CT within the next couple of days

## 2019-11-13 NOTE — Telephone Encounter (Signed)
Patient is aware of results. I discussed the results via message with pulmonary. They recommend first a PET scan and posteriorly a BX. 1.  Order PET scan diagnosis lung mass, rule out cancer 2.  Enter formal pulmonary referral urgent. Let the patient know

## 2019-11-13 NOTE — Telephone Encounter (Signed)
Scheduled for NM PET scan Tues 7/27 1pm, arrival at 12:30pm at [facility]. Pt needs to be NPO 6 hours prior. Spoke w/ Pt- informed of scheduled PET scan and location, informed she must be NPO for 6 hours prior. Pt verbalized understanding.

## 2019-11-13 NOTE — Addendum Note (Signed)
Addended byDamita Dunnings D on: 11/13/2019 07:37 AM   Modules accepted: Orders

## 2019-11-13 NOTE — Telephone Encounter (Signed)
Spoke w/ Pt- informed of plan and informed her to expect some phone calls regarding pulm referral and PET scan scheduling. Pt verbalized understanding.

## 2019-11-17 ENCOUNTER — Other Ambulatory Visit: Payer: Self-pay

## 2019-11-17 ENCOUNTER — Encounter (HOSPITAL_COMMUNITY)
Admission: RE | Admit: 2019-11-17 | Discharge: 2019-11-17 | Disposition: A | Discharge status: Home / Self Care | Payer: PPO | Source: Ambulatory Visit | Attending: Internal Medicine | Admitting: Internal Medicine

## 2019-11-17 DIAGNOSIS — I700 Atherosclerosis of aorta: Secondary | ICD-10-CM | POA: Diagnosis not present

## 2019-11-17 DIAGNOSIS — I2510 Atherosclerotic heart disease of native coronary artery without angina pectoris: Secondary | ICD-10-CM | POA: Diagnosis not present

## 2019-11-17 DIAGNOSIS — J910 Malignant pleural effusion: Secondary | ICD-10-CM | POA: Diagnosis not present

## 2019-11-17 DIAGNOSIS — C801 Malignant (primary) neoplasm, unspecified: Secondary | ICD-10-CM | POA: Diagnosis not present

## 2019-11-17 DIAGNOSIS — R918 Other nonspecific abnormal finding of lung field: Secondary | ICD-10-CM | POA: Insufficient documentation

## 2019-11-17 DIAGNOSIS — Z20822 Contact with and (suspected) exposure to covid-19: Secondary | ICD-10-CM | POA: Diagnosis not present

## 2019-11-17 DIAGNOSIS — C782 Secondary malignant neoplasm of pleura: Secondary | ICD-10-CM | POA: Insufficient documentation

## 2019-11-17 DIAGNOSIS — R590 Localized enlarged lymph nodes: Secondary | ICD-10-CM | POA: Insufficient documentation

## 2019-11-17 DIAGNOSIS — Z01812 Encounter for preprocedural laboratory examination: Secondary | ICD-10-CM | POA: Diagnosis not present

## 2019-11-17 LAB — GLUCOSE, CAPILLARY: Glucose-Capillary: 99 mg/dL (ref 70–99)

## 2019-11-17 MED ORDER — FLUDEOXYGLUCOSE F - 18 (FDG) INJECTION
5.6200 | Freq: Once | INTRAVENOUS | Status: AC | PRN
  Administered 2019-11-17: 5.62 via INTRAVENOUS

## 2019-11-17 NOTE — Telephone Encounter (Signed)
I had already addressed this in a separate message to Dr Larose Kells and left him a message today on his cell letting him know I will f/u   PET scheduled for 11/17/2019 when results are available

## 2019-11-17 NOTE — Telephone Encounter (Signed)
Pt had a cxr performed 7/21 and then had a CT performed 7/22 which was ordered by Dr. Larose Kells. Note from the CT is that he would like advice from Dr. Melvyn Novas about the results.  Dr. Larose Kells has requested to have Dr. Melvyn Novas contact him about this. Dr. Melvyn Novas, please advise.

## 2019-11-18 ENCOUNTER — Other Ambulatory Visit: Payer: Self-pay | Admitting: Internal Medicine

## 2019-11-18 ENCOUNTER — Telehealth: Payer: Self-pay | Admitting: Internal Medicine

## 2019-11-18 ENCOUNTER — Encounter: Payer: Self-pay | Admitting: Internal Medicine

## 2019-11-18 DIAGNOSIS — J90 Pleural effusion, not elsewhere classified: Secondary | ICD-10-CM

## 2019-11-18 NOTE — Telephone Encounter (Signed)
Thoracentesis ordered

## 2019-11-19 ENCOUNTER — Other Ambulatory Visit: Payer: Self-pay | Admitting: Family Medicine

## 2019-11-19 MED ORDER — HYDROCODONE-ACETAMINOPHEN 5-325 MG PO TABS: 1.0000 | ORAL_TABLET | Freq: Three times a day (TID) | ORAL | 0 refills | Status: DC | PRN

## 2019-11-19 NOTE — Telephone Encounter (Signed)
Hydrocodone refill. Pt is scheduled for pre-procedure covid testing tomorrow and then must quarantine- requesting refill early.   Last OV: 11/11/2019 Last Fill: 11/12/2019 #30 and 0RF Pt sig: 1-2 tab tid prn

## 2019-11-19 NOTE — Telephone Encounter (Addendum)
Prescription sent by PCP, will let the patient know.

## 2019-11-20 ENCOUNTER — Ambulatory Visit: Payer: PPO | Admitting: Family Medicine

## 2019-11-20 ENCOUNTER — Other Ambulatory Visit (HOSPITAL_COMMUNITY)
Admission: RE | Admit: 2019-11-20 | Discharge: 2019-11-20 | Disposition: A | Discharge status: Home / Self Care | Payer: PPO | Source: Ambulatory Visit | Attending: Internal Medicine | Admitting: Internal Medicine

## 2019-11-20 DIAGNOSIS — Z01812 Encounter for preprocedural laboratory examination: Secondary | ICD-10-CM | POA: Diagnosis not present

## 2019-11-20 LAB — SARS CORONAVIRUS 2 (TAT 6-24 HRS): SARS Coronavirus 2: NEGATIVE

## 2019-11-23 ENCOUNTER — Other Ambulatory Visit: Payer: Self-pay

## 2019-11-23 ENCOUNTER — Encounter: Payer: Self-pay | Admitting: Internal Medicine

## 2019-11-23 ENCOUNTER — Ambulatory Visit (HOSPITAL_COMMUNITY)
Admission: RE | Admit: 2019-11-23 | Discharge: 2019-11-23 | Disposition: A | Discharge status: Home / Self Care | Payer: PPO | Source: Ambulatory Visit | Attending: Radiology | Admitting: Radiology

## 2019-11-23 ENCOUNTER — Ambulatory Visit (HOSPITAL_COMMUNITY)
Admission: RE | Admit: 2019-11-23 | Discharge: 2019-11-23 | Disposition: A | Discharge status: Home / Self Care | Payer: PPO | Source: Ambulatory Visit | Attending: Internal Medicine | Admitting: Internal Medicine

## 2019-11-23 DIAGNOSIS — I10 Essential (primary) hypertension: Secondary | ICD-10-CM | POA: Diagnosis present

## 2019-11-23 DIAGNOSIS — C3492 Malignant neoplasm of unspecified part of left bronchus or lung: Secondary | ICD-10-CM | POA: Diagnosis present

## 2019-11-23 DIAGNOSIS — Z8249 Family history of ischemic heart disease and other diseases of the circulatory system: Secondary | ICD-10-CM | POA: Diagnosis not present

## 2019-11-23 DIAGNOSIS — Z806 Family history of leukemia: Secondary | ICD-10-CM | POA: Diagnosis not present

## 2019-11-23 DIAGNOSIS — Q048 Other specified congenital malformations of brain: Secondary | ICD-10-CM | POA: Diagnosis not present

## 2019-11-23 DIAGNOSIS — J90 Pleural effusion, not elsewhere classified: Secondary | ICD-10-CM | POA: Insufficient documentation

## 2019-11-23 DIAGNOSIS — J9601 Acute respiratory failure with hypoxia: Secondary | ICD-10-CM | POA: Diagnosis present

## 2019-11-23 DIAGNOSIS — C3412 Malignant neoplasm of upper lobe, left bronchus or lung: Secondary | ICD-10-CM | POA: Diagnosis not present

## 2019-11-23 DIAGNOSIS — G9389 Other specified disorders of brain: Secondary | ICD-10-CM | POA: Diagnosis not present

## 2019-11-23 DIAGNOSIS — R0602 Shortness of breath: Secondary | ICD-10-CM | POA: Diagnosis present

## 2019-11-23 DIAGNOSIS — R9082 White matter disease, unspecified: Secondary | ICD-10-CM | POA: Diagnosis not present

## 2019-11-23 DIAGNOSIS — Z8041 Family history of malignant neoplasm of ovary: Secondary | ICD-10-CM | POA: Diagnosis not present

## 2019-11-23 DIAGNOSIS — J984 Other disorders of lung: Secondary | ICD-10-CM | POA: Diagnosis not present

## 2019-11-23 DIAGNOSIS — C782 Secondary malignant neoplasm of pleura: Secondary | ICD-10-CM | POA: Diagnosis not present

## 2019-11-23 DIAGNOSIS — J439 Emphysema, unspecified: Secondary | ICD-10-CM | POA: Diagnosis present

## 2019-11-23 DIAGNOSIS — J189 Pneumonia, unspecified organism: Secondary | ICD-10-CM | POA: Diagnosis present

## 2019-11-23 DIAGNOSIS — Z833 Family history of diabetes mellitus: Secondary | ICD-10-CM | POA: Diagnosis not present

## 2019-11-23 DIAGNOSIS — Z8509 Personal history of malignant neoplasm of other digestive organs: Secondary | ICD-10-CM | POA: Diagnosis not present

## 2019-11-23 DIAGNOSIS — Z85831 Personal history of malignant neoplasm of soft tissue: Secondary | ICD-10-CM | POA: Diagnosis not present

## 2019-11-23 DIAGNOSIS — Z79899 Other long term (current) drug therapy: Secondary | ICD-10-CM | POA: Diagnosis not present

## 2019-11-23 DIAGNOSIS — Q8501 Neurofibromatosis, type 1: Secondary | ICD-10-CM | POA: Diagnosis not present

## 2019-11-23 DIAGNOSIS — R0902 Hypoxemia: Secondary | ICD-10-CM | POA: Diagnosis not present

## 2019-11-23 DIAGNOSIS — I4891 Unspecified atrial fibrillation: Secondary | ICD-10-CM | POA: Diagnosis not present

## 2019-11-23 DIAGNOSIS — Z20822 Contact with and (suspected) exposure to covid-19: Secondary | ICD-10-CM | POA: Diagnosis present

## 2019-11-23 DIAGNOSIS — Z961 Presence of intraocular lens: Secondary | ICD-10-CM | POA: Diagnosis present

## 2019-11-23 DIAGNOSIS — Z90722 Acquired absence of ovaries, bilateral: Secondary | ICD-10-CM | POA: Diagnosis not present

## 2019-11-23 DIAGNOSIS — J910 Malignant pleural effusion: Secondary | ICD-10-CM | POA: Diagnosis present

## 2019-11-23 DIAGNOSIS — Z90411 Acquired partial absence of pancreas: Secondary | ICD-10-CM | POA: Diagnosis not present

## 2019-11-23 DIAGNOSIS — F17200 Nicotine dependence, unspecified, uncomplicated: Secondary | ICD-10-CM | POA: Diagnosis not present

## 2019-11-23 DIAGNOSIS — Z9889 Other specified postprocedural states: Secondary | ICD-10-CM

## 2019-11-23 DIAGNOSIS — C384 Malignant neoplasm of pleura: Secondary | ICD-10-CM | POA: Diagnosis not present

## 2019-11-23 DIAGNOSIS — Z9049 Acquired absence of other specified parts of digestive tract: Secondary | ICD-10-CM | POA: Diagnosis not present

## 2019-11-23 DIAGNOSIS — R0689 Other abnormalities of breathing: Secondary | ICD-10-CM | POA: Diagnosis not present

## 2019-11-23 DIAGNOSIS — G4700 Insomnia, unspecified: Secondary | ICD-10-CM | POA: Diagnosis present

## 2019-11-23 DIAGNOSIS — R52 Pain, unspecified: Secondary | ICD-10-CM | POA: Diagnosis not present

## 2019-11-23 DIAGNOSIS — Q283 Other malformations of cerebral vessels: Secondary | ICD-10-CM | POA: Diagnosis not present

## 2019-11-23 DIAGNOSIS — R918 Other nonspecific abnormal finding of lung field: Secondary | ICD-10-CM | POA: Diagnosis not present

## 2019-11-23 DIAGNOSIS — C3490 Malignant neoplasm of unspecified part of unspecified bronchus or lung: Secondary | ICD-10-CM | POA: Diagnosis not present

## 2019-11-23 DIAGNOSIS — Z9842 Cataract extraction status, left eye: Secondary | ICD-10-CM | POA: Diagnosis not present

## 2019-11-23 DIAGNOSIS — Z90710 Acquired absence of both cervix and uterus: Secondary | ICD-10-CM | POA: Diagnosis not present

## 2019-11-23 DIAGNOSIS — F17210 Nicotine dependence, cigarettes, uncomplicated: Secondary | ICD-10-CM | POA: Diagnosis present

## 2019-11-23 LAB — BODY FLUID CELL COUNT WITH DIFFERENTIAL
Lymphs, Fluid: 7 %
Monocyte-Macrophage-Serous Fluid: 6 % — ABNORMAL LOW (ref 50–90)
Neutrophil Count, Fluid: 1 % (ref 0–25)
Other Cells, Fluid: 86 %
Total Nucleated Cell Count, Fluid: 3425 cu mm — ABNORMAL HIGH (ref 0–1000)

## 2019-11-23 LAB — LACTATE DEHYDROGENASE, PLEURAL OR PERITONEAL FLUID: LD, Fluid: 893 U/L — ABNORMAL HIGH (ref 3–23)

## 2019-11-23 LAB — GLUCOSE, PLEURAL OR PERITONEAL FLUID: Glucose, Fluid: 74 mg/dL

## 2019-11-23 LAB — PROTEIN, PLEURAL OR PERITONEAL FLUID: Total protein, fluid: 4.1 g/dL

## 2019-11-23 MED ORDER — LIDOCAINE HCL 1 % IJ SOLN
INTRAMUSCULAR | Status: AC
  Filled 2019-11-23: qty 20

## 2019-11-23 NOTE — Procedures (Signed)
Ultrasound-guided diagnostic and therapeutic left thoracentesis performed yielding 1.1 liters of slightly hazy, yellow fluid. No immediate complications. Follow-up chest x-ray pending. EBL <1 cc.

## 2019-11-24 ENCOUNTER — Encounter (HOSPITAL_COMMUNITY): Payer: Self-pay

## 2019-11-24 ENCOUNTER — Emergency Department (HOSPITAL_COMMUNITY): Payer: PPO

## 2019-11-24 ENCOUNTER — Inpatient Hospital Stay (HOSPITAL_COMMUNITY)
Admission: EM | Admit: 2019-11-24 | Discharge: 2019-11-27 | DRG: 180 | Disposition: A | Discharge status: Home / Self Care | Payer: PPO | Attending: Family Medicine | Admitting: Family Medicine

## 2019-11-24 ENCOUNTER — Other Ambulatory Visit: Payer: Self-pay

## 2019-11-24 DIAGNOSIS — J910 Malignant pleural effusion: Secondary | ICD-10-CM | POA: Diagnosis present

## 2019-11-24 DIAGNOSIS — J9601 Acute respiratory failure with hypoxia: Secondary | ICD-10-CM | POA: Diagnosis present

## 2019-11-24 DIAGNOSIS — Z9049 Acquired absence of other specified parts of digestive tract: Secondary | ICD-10-CM

## 2019-11-24 DIAGNOSIS — Q8501 Neurofibromatosis, type 1: Secondary | ICD-10-CM | POA: Diagnosis present

## 2019-11-24 DIAGNOSIS — F17200 Nicotine dependence, unspecified, uncomplicated: Secondary | ICD-10-CM | POA: Diagnosis present

## 2019-11-24 DIAGNOSIS — G4700 Insomnia, unspecified: Secondary | ICD-10-CM | POA: Diagnosis present

## 2019-11-24 DIAGNOSIS — Z85831 Personal history of malignant neoplasm of soft tissue: Secondary | ICD-10-CM

## 2019-11-24 DIAGNOSIS — Z961 Presence of intraocular lens: Secondary | ICD-10-CM | POA: Diagnosis present

## 2019-11-24 DIAGNOSIS — Z9842 Cataract extraction status, left eye: Secondary | ICD-10-CM

## 2019-11-24 DIAGNOSIS — J439 Emphysema, unspecified: Secondary | ICD-10-CM | POA: Diagnosis present

## 2019-11-24 DIAGNOSIS — Z90722 Acquired absence of ovaries, bilateral: Secondary | ICD-10-CM

## 2019-11-24 DIAGNOSIS — Z833 Family history of diabetes mellitus: Secondary | ICD-10-CM

## 2019-11-24 DIAGNOSIS — I10 Essential (primary) hypertension: Secondary | ICD-10-CM | POA: Diagnosis present

## 2019-11-24 DIAGNOSIS — Z8509 Personal history of malignant neoplasm of other digestive organs: Secondary | ICD-10-CM | POA: Diagnosis present

## 2019-11-24 DIAGNOSIS — F17210 Nicotine dependence, cigarettes, uncomplicated: Secondary | ICD-10-CM | POA: Diagnosis present

## 2019-11-24 DIAGNOSIS — Z8041 Family history of malignant neoplasm of ovary: Secondary | ICD-10-CM

## 2019-11-24 DIAGNOSIS — Z90710 Acquired absence of both cervix and uterus: Secondary | ICD-10-CM

## 2019-11-24 DIAGNOSIS — Z90411 Acquired partial absence of pancreas: Secondary | ICD-10-CM

## 2019-11-24 DIAGNOSIS — C3492 Malignant neoplasm of unspecified part of left bronchus or lung: Principal | ICD-10-CM

## 2019-11-24 DIAGNOSIS — Z79899 Other long term (current) drug therapy: Secondary | ICD-10-CM

## 2019-11-24 DIAGNOSIS — Z8249 Family history of ischemic heart disease and other diseases of the circulatory system: Secondary | ICD-10-CM

## 2019-11-24 DIAGNOSIS — Z20822 Contact with and (suspected) exposure to covid-19: Secondary | ICD-10-CM | POA: Diagnosis present

## 2019-11-24 DIAGNOSIS — J189 Pneumonia, unspecified organism: Secondary | ICD-10-CM | POA: Diagnosis present

## 2019-11-24 DIAGNOSIS — R0902 Hypoxemia: Secondary | ICD-10-CM

## 2019-11-24 DIAGNOSIS — R000 Tachycardia, unspecified: Secondary | ICD-10-CM

## 2019-11-24 DIAGNOSIS — R0602 Shortness of breath: Secondary | ICD-10-CM

## 2019-11-24 DIAGNOSIS — Z806 Family history of leukemia: Secondary | ICD-10-CM

## 2019-11-24 LAB — BASIC METABOLIC PANEL
Anion gap: 15 (ref 5–15)
BUN: 9 mg/dL (ref 8–23)
CO2: 21 mmol/L — ABNORMAL LOW (ref 22–32)
Calcium: 9 mg/dL (ref 8.9–10.3)
Chloride: 98 mmol/L (ref 98–111)
Creatinine, Ser: 0.6 mg/dL (ref 0.44–1.00)
GFR calc Af Amer: >60 mL/min (ref >60)
GFR calc non Af Amer: >60 mL/min (ref >60)
Glucose, Bld: 117 mg/dL — ABNORMAL HIGH (ref 70–99)
Potassium: 3.9 mmol/L (ref 3.5–5.1)
Sodium: 134 mmol/L — ABNORMAL LOW (ref 135–145)

## 2019-11-24 LAB — CBC WITH DIFFERENTIAL/PLATELET
Abs Immature Granulocytes: 0.08 10*3/uL — ABNORMAL HIGH (ref 0.00–0.07)
Basophils Absolute: 0.1 10*3/uL (ref 0.0–0.1)
Basophils Relative: 0 %
Eosinophils Absolute: 0.1 10*3/uL (ref 0.0–0.5)
Eosinophils Relative: 0 %
HCT: 40.8 % (ref 36.0–46.0)
Hemoglobin: 13.3 g/dL (ref 12.0–15.0)
Immature Granulocytes: 0 %
Lymphocytes Relative: 19 %
Lymphs Abs: 3.3 10*3/uL (ref 0.7–4.0)
MCH: 27.7 pg (ref 26.0–34.0)
MCHC: 32.6 g/dL (ref 30.0–36.0)
MCV: 84.8 fL (ref 80.0–100.0)
Monocytes Absolute: 2 10*3/uL — ABNORMAL HIGH (ref 0.1–1.0)
Monocytes Relative: 11 %
Neutro Abs: 12.3 10*3/uL — ABNORMAL HIGH (ref 1.7–7.7)
Neutrophils Relative %: 70 %
Platelets: 447 10*3/uL — ABNORMAL HIGH (ref 150–400)
RBC: 4.81 MIL/uL (ref 3.87–5.11)
RDW: 14 % (ref 11.5–15.5)
WBC: 17.8 10*3/uL — ABNORMAL HIGH (ref 4.0–10.5)
nRBC: 0 % (ref 0.0–0.2)

## 2019-11-24 MED ORDER — IOHEXOL 350 MG/ML SOLN
50.0000 mL | Freq: Once | INTRAVENOUS | Status: AC | PRN
  Administered 2019-11-24: 50 mL via INTRAVENOUS

## 2019-11-24 NOTE — ED Provider Notes (Signed)
7:04 AM Assumed care from Dr. Maryan Rued, please see their note for full history, physical and decision making until this point. In brief this is a 64 y.o. year old female who presented to the ED tonight with Shortness of Breath and Tachycardia     Requiring multiple liters oxygen. Had 1.1L removed yesterday. Reexpansion vs infection vs post obstructive. Will cover with abx.  Plan for admission  Discussed with medicine for admit.   Labs, studies and imaging reviewed by myself and considered in medical decision making if ordered. Imaging interpreted by radiology.  Labs Reviewed  CBC WITH DIFFERENTIAL/PLATELET - Abnormal; Notable for the following components:      Result Value   WBC 17.8 (*)    Platelets 447 (*)    Neutro Abs 12.3 (*)    Monocytes Absolute 2.0 (*)    Abs Immature Granulocytes 0.08 (*)    All other components within normal limits  BASIC METABOLIC PANEL - Abnormal; Notable for the following components:   Sodium 134 (*)    CO2 21 (*)    Glucose, Bld 117 (*)    All other components within normal limits  CBC - Abnormal; Notable for the following components:   WBC 16.2 (*)    Platelets 431 (*)    All other components within normal limits  SARS CORONAVIRUS 2 BY RT PCR (HOSPITAL ORDER, [place] [facility])  CULTURE, BLOOD (ROUTINE X 2)  CULTURE, BLOOD (ROUTINE X 2)  PROCALCITONIN  HIV ANTIBODY (ROUTINE TESTING W REFLEX)  COMPREHENSIVE METABOLIC PANEL    CT Angio Chest PE W and/or Wo Contrast  Final Result    DG Chest Port 1 View  Final Result      No follow-ups on file.    Ellason Segar, Corene Cornea, MD 11/25/19 [phone]

## 2019-11-24 NOTE — ED Provider Notes (Signed)
[name] [name] Provider Note   CSN: [id number] Arrival date & time: 11/24/19  1937     History Chief Complaint  Patient presents with  . Shortness of Breath  . Tachycardia    [name] [name] is a 64 y.o. female.  Patient is a 64 year old female with a history of neurofibromatosis, new lesions in her left lung that she is told are cancer with pleural effusion and bilateral pneumothoraxes in the past who is presenting today with significant shortness of breath.  Patient reports that she had a thoracentesis done yesterday where 1.1 L of fluid was removed from her left lung.  She was feeling well when she went home yesterday however reports this morning when she woke up she has been significantly short of breath.  Any movement, walking or exertion makes her significantly short of breath.  She has had a dry cough which is unchanged.  She denies any chest pain, abdominal pain, nausea or vomiting.  She has not had a fever.  She spoke with her pulmonologist who recommended she come to the emergency room.  She does not wear home oxygen and she still uses tobacco daily.  The history is provided by the patient.  Shortness of Breath Severity:  Severe Onset quality:  Sudden Duration:  12 hours Timing:  Constant Progression:  Unchanged Chronicity:  New      Past Medical History:  Diagnosis Date  . Allergy   . 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 Carcinoid tumor of abdomen   . Cataract    both eyes  . Family history of adverse reaction to anesthesia    Mother had trouble waking up  . Hypertension   . Lower back pain   . 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 Pneumothorax on left   . Pneumothorax on right     Patient Active Problem List   Diagnosis Date Noted  . Pleural effusion on left 11/23/2019  . Educated about COVID-19 virus infection 09/27/2019  . SOB (shortness of breath) 09/24/2019  . Palpitations 09/24/2019  .  Proteinuria 09/24/2019  . PAC (premature atrial contraction) 09/24/2019  . COPD (chronic obstructive pulmonary disease) with emphysema ([place]) 08/02/2015  . Cough 08/02/2015  . GERD (gastroesophageal reflux disease) 08/02/2015  . Tobacco user 08/02/2015  . Exertional dyspnea 08/02/2015  . Cervical lymphadenopathy 12/29/2014  . Tension headache 03/15/2014  . Trapezius muscle spasm 03/15/2014  . Screening for malignant neoplasm of the cervix 06/05/2013  . Exocrine pancreatic insufficiency 07/10/2011  . General medical examination 01/29/2011  . Neuroendocrine tumor of duodenum [id number], s/p whipple 01/04/2010 12/18/2010  . 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 INSOMNIA-SLEEP DISORDER-UNSPEC 11/22/2009  . NONSPECIFIC ABN FINDING RAD & OTH EXAM GI TRACT 11/15/2009  . TRANSAMINASES, SERUM, ELEVATED 11/03/2009  . Neurofibromatosis, type 1 (von Recklinghausen's disease) ([place]) 11/01/2009  . TOBACCO USE 11/01/2009  . OSTEOPENIA 11/01/2009  . ABDOMINAL PAIN, RIGHT UPPER QUADRANT 11/01/2009    Past Surgical History:  Procedure Laterality Date  . ABDOMINAL HYSTERECTOMY  04/23/1997   fibroids  TAH/ BSO  . CHOLECYSTECTOMY    . COLONOSCOPY  2006  . EYE SURGERY Left 11/2015   cataract and astigmatism surgery with Intraocular Lens left eye  . LASIK    . LUNG SURGERY  2008?   d/t pneumothroax  . MEMBRANE PEEL Left 08/16/2014   Procedure: MEMBRANE PEEL LEFT EYE ;  Surgeon: Sherlynn Stalls,  MD;  Location: [facility];  Service: Ophthalmology;  Laterality: Left;  . OOPHORECTOMY    . PARS PLANA VITRECTOMY Left 08/16/2014   Procedure: PARS PLANA VITRECTOMY WITH 25 GAUGE;  Surgeon: Sherlynn Stalls, MD;  Location: [facility];  Service: Ophthalmology;  Laterality: Left;  . TONSILLECTOMY    . WHIPPLE PROCEDURE       OB History   No obstetric history on file.     Family History  Problem Relation Age of Onset  . Cancer Mother   . Cancer Father   . Heart disease Father    . Cancer Sister   . Heart disease Sister   . Dementia Sister   . Diabetes Brother   . Colon cancer Neg Hx     Social History   Tobacco Use  . Smoking status: Current Every Day Smoker    Packs/day: 0.50    Years: 47.00    Pack years: 23.50    Types: Cigarettes  . Smokeless tobacco: Never Used  . Tobacco comment: Down to 6 cigarettes daily, working on decreaseing even more  Substance Use Topics  . Alcohol use: Yes    Alcohol/week: 0.0 standard drinks    Comment: RARE 1-2 drinks year  . Drug use: No    Home Medications Prior to Admission medications   Medication Sig Start Date End Date Taking? Authorizing Provider  Cholecalciferol (D3) 50 MCG (2000 UT) TABS     [provider]  HYDROcodone-acetaminophen (NORCO/VICODIN) 5-325 MG tablet Take 1-2 tablets by mouth 3 (three) times daily as needed for up to 6 days. 11/19/19 11/25/19  [name], DO  metoprolol tartrate (LOPRESSOR) 25 MG tablet Take 0.5 tablets (12.5 mg total) by mouth 2 (two) times daily. 09/28/19 12/27/19  Minus Breeding, MD  Multiple Vitamins-Minerals (MULTIVITAMINS THER. W/MINERALS) TABS Take 1 tablet by mouth daily.    [provider]    Allergies    Patient has no known allergies.  Review of Systems   Review of Systems  Respiratory: Positive for shortness of breath.   All other systems reviewed and are negative.   Physical Exam Updated Vital Signs BP (!) 159/80   Pulse 94   Temp 98.1 F (36.7 C) (Oral)   Resp (!) 28   Ht 5' 2.5" (1.588 m)   Wt 49 kg   SpO2 96%   BMI 19.44 kg/m   Physical Exam Vitals and nursing note reviewed.  Constitutional:      General: She is in acute distress.     Appearance: She is well-developed and normal weight.  HENT:     Head: Normocephalic and atraumatic.     Mouth/Throat:     Mouth: Mucous membranes are moist.  Cardiovascular:     Rate and Rhythm: Normal rate and regular rhythm.     Pulses: Normal pulses.  Pulmonary:     Effort:  Tachypnea present.     Breath sounds: Examination of the left-middle field reveals decreased breath sounds. Examination of the left-lower field reveals decreased breath sounds. Decreased breath sounds present.  Abdominal:     General: Abdomen is flat. Bowel sounds are normal. There is no distension.     Palpations: Abdomen is soft.     Tenderness: There is no abdominal tenderness. There is no right CVA tenderness or left CVA tenderness.     Comments: No splenomegaly  Musculoskeletal:     Right lower leg: No tenderness. No edema.     Left lower leg: No tenderness.  No edema.  Skin:    Comments: Lesions consistent with neurofibromatosis diffusely over the body  Neurological:     General: No focal deficit present.     Mental Status: She is alert and oriented to person, place, and time. Mental status is at baseline.  Psychiatric:        Mood and Affect: Mood normal.        Behavior: Behavior normal.        Thought Content: Thought content normal.     ED Results / Procedures / Treatments   Labs (all labs ordered are listed, but only abnormal results are displayed) Labs Reviewed  CBC WITH DIFFERENTIAL/PLATELET - Abnormal; Notable for the following components:      Result Value   WBC 17.8 (*)    Platelets 447 (*)    Neutro Abs 12.3 (*)    Monocytes Absolute 2.0 (*)    Abs Immature Granulocytes 0.08 (*)    All other components within normal limits  BASIC METABOLIC PANEL - Abnormal; Notable for the following components:   Sodium 134 (*)    CO2 21 (*)    Glucose, Bld 117 (*)    All other components within normal limits    EKG EKG Interpretation  Date/Time:  Tuesday November 24 2019 19:46:52 EDT Ventricular Rate:  90 PR Interval:    QRS Duration: 72 QT Interval:  336 QTC Calculation: 412 R Axis:   57 Text Interpretation: Sinus rhythm Left atrial enlargement Anteroseptal infarct, age indeterminate Artifact in lead(s) I II aVR aVL aVF Confirmed by Blanchie Dessert [phone]) on  11/24/2019 7:48:34 PM   Radiology DG Chest 1 View  Result Date: 11/23/2019 CLINICAL DATA:  Post left thoracentesis EXAM: CHEST  1 VIEW COMPARISON:  Chest CT 11/12/2019 FINDINGS: No pneumothorax following left thoracentesis. Airspace disease in the left lower lobe. Underlying apical emphysematous changes and scarring. Heart is normal size. IMPRESSION: No pneumothorax following thoracentesis. Electronically Signed   By: Rolm Baptise M.D.   On: 11/23/2019 10:25   CT Angio Chest PE W and/or Wo Contrast  Result Date: 11/24/2019 CLINICAL DATA:  Shortness of breath and tachycardia, thoracentesis yesterday EXAM: CT ANGIOGRAPHY CHEST WITH CONTRAST TECHNIQUE: Multidetector CT imaging of the chest was performed using the standard protocol during bolus administration of intravenous contrast. Multiplanar CT image reconstructions and MIPs were obtained to evaluate the vascular anatomy. CONTRAST:  33mL OMNIPAQUE IOHEXOL 350 MG/ML SOLN COMPARISON:  PET-CT 11/17/2019, radiograph 11/24/2019, CT chest 11/12/2019 FINDINGS: Cardiovascular: Satisfactory opacification the pulmonary arteries to the segmental level. No pulmonary artery filling defects are identified. Tapering of the pulmonary arteries as they enter the region of the left lower lobe mass and hilar invasion detailed below. Central pulmonary arteries are otherwise normal caliber. The aortic root is suboptimally assessed given cardiac pulsation artifact. Atherosclerotic plaque within the normal caliber aorta. No definite acute luminal abnormality within limitations of this non tailored evaluation. Atherosclerotic calcification at the origins of the great vessels including a shared origin of the left common carotid and brachiocephalic artery. Normal cardiac size. No visible pericardial effusion. For the discernible fat plane between the left ventricular free wall and the adjacent mediastinal mass seen on comparison CT and PET though this can be partially attributable to  cardiac motion and volume averaging. Mediastinum/Nodes: There is extensive direct invasion of a left lower lobe mass extending throughout the soft tissues of the left hilum and mediastinum demonstrating hypermetabolism on the comparison PET-CT compatible with a primary lung neoplasm. Previously FDG avid  enlarged AP window lymph nodes measuring up to 2.3 cm (6/71) are similar to mildly increased in size from comparison. No acute abnormality of the trachea. Loss of discernible fat plane between the mediastinal mass and the thoracic esophagus. No concerning thyroid nodules. Lungs/Pleura: Redemonstration the heterogeneous Lea enhancing lesion seen centrally in the left lower lobe and extending into the medial aspect of the lingula/left upper lobe and invading the left hilum and extending along the mediastinal borders. Additional extensive FDG avid pleural nodularity is present as well. There is a persistent moderate size loculated malignant effusion. The central airways, pulmonary arteries appear mildly tapered with truncation of the pulmonary veins with narrowing and segmental occlusions of the central airways the lingula and left lower lobe. Additional consolidative opacity and ground-glass within the aerated portions of the left lower lobe and lingula likely reflect some postobstructive infection or inflammation. There are remote postsurgical changes in the bilateral lung apices with evidence of prior thoracotomy. Findings are on a background of diffuse moderate centrilobular and severe paraseptal emphysematous change. Chronic biapical areas of scarring are noted. Upper Abdomen: Pneumobilia noted in the liver. No other acute upper abdominal abnormality is evident. Musculoskeletal: Evidence of prior bilateral thoracotomy widening of the second interspaces bilaterally. No evidence of direct chest wall invasion at this time. Body wall edema is noted. No evidence of osseous metastatic disease. Review of the MIP images  confirms the above findings. IMPRESSION: 1. No evidence of acute pulmonary artery filling defects. 2. Redemonstration of the heterogeneous enhancing lesion centrally in the left lower lobe and extending into the medial aspect of the lingula/left upper lobe as well as directly invading the left hilum and extending along the mediastinal borders. Findings compatible with primary lung neoplasm similar to slightly increased in size. 3. Redemonstration of the previously FDG avid pleural nodularity with a moderate persistent left malignant effusion with loculation. 4. Previously FDG avid enlarged AP window lymph nodes are similar to mildly increased in size from comparison PET-CT. 5. Left lung lesion with hilar invasion results in some narrowing of the pulmonary arteries, occlusion of the pulmonary veins and segmental occlusions of the central airways of the left lower lobe and lingula. Consolidative opacity and ground-glass within the aerated portions of the left lower lobe and lingula likely reflect some postobstructive infection or inflammation. 6. Evidence of prior bilateral thoracotomy widening of the second interspaces and postsurgical changes in the lung apices. 7. Pneumobilia in the liver, correlate for prior sphincterotomy. 8. Aortic Atherosclerosis (ICD10-I70.0) 9. Emphysema (ICD10-J43.9). Electronically Signed   By: Lovena Le M.D.   On: 11/24/2019 23:10   DG Chest Port 1 View  Result Date: 11/24/2019 CLINICAL DATA:  Shortness of breath. EXAM: PORTABLE CHEST 1 VIEW COMPARISON:  November 23, 2019 FINDINGS: Marked severity atelectasis and/or infiltrate is seen within the left lung base. This is increased in severity when compared to the prior study. There is a small left pleural effusion which is also increased in size. No pneumothorax is identified. The heart size and mediastinal contours are within normal limits. A small deformity is seen along the lateral aspect of the upper left chest wall, with subsequent  herniation of a small amount of left upper lobe. This is present on the prior study. There is mild dextroscoliosis of the midthoracic spine. IMPRESSION: 1. Marked severity left basilar atelectasis and/or infiltrate, increased in severity when compared to the prior study. 2. Small left pleural effusion which is increased in size. Electronically Signed   By: Hoover Browns  [place] M.D.   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 Request received for diagnostic and therapeutic left thoracentesis. EXAM: ULTRASOUND GUIDED DIAGNOSTIC AND THERAPEUTIC LEFT THORACENTESIS MEDICATIONS: 1% lidocaine to skin and subcutaneous tissue COMPLICATIONS: None immediate. PROCEDURE: An ultrasound guided thoracentesis was thoroughly discussed with the patient and questions answered. The benefits, risks, alternatives and complications were also discussed. The patient understands and wishes to proceed with the procedure. Written consent was obtained. Ultrasound was performed to localize and mark an adequate pocket of fluid in the left chest. The area was then prepped and draped in the normal sterile fashion. 1% Lidocaine was used for local anesthesia. Under ultrasound guidance a 6 Fr Safe-T-Centesis catheter was introduced. Thoracentesis was performed. The catheter was removed and a dressing applied. FINDINGS: A total of approximately 1.1 liters of slightly hazy, yellow fluid was removed. Samples were sent to the laboratory as requested by the clinical team. IMPRESSION: Successful ultrasound guided diagnostic and therapeutic left thoracentesis yielding 1.1 liters of pleural fluid. Read by: Rowe Robert, PA-C Electronically Signed   By: Jacqulynn Cadet M.D.   On: 11/23/2019 11:13    Procedures Procedures (including critical care  time)  Medications Ordered in ED Medications - No data to display  ED Course  I have reviewed the triage vital signs and the nursing notes.  Pertinent labs & imaging results that were available during my care of the patient were reviewed by me and considered in my medical decision making (see chart for details).    MDM Rules/Calculators/A&P                          64 year old female presenting today with worsening shortness of breath that started when she woke up this morning.  Significant history of thoracentesis yesterday where 1.1 L was removed on the left side due to new tumors that are thought to be cancerous.  Patient states she still undergoing testing and evaluation to determine exactly what it is.  She does smoke daily but does not wear oxygen at home.  She is significantly winded with any activity.  Patient found to be tachypneic and does improve with nasal cannula oxygen.  Currently on 4 L and sats are 97% with rate of breathing now in the 20s.  She denies any infectious symptoms has no evidence of systemic fluid overload.  She denies any Covid-like symptoms.  She denies any chest pain and EKG shows a sinus rhythm with significant artifact but no findings consistent with ST elevation or ACS.  Discern for possible pneumothorax given recent procedure versus fluid reaccumulation.  Pt would also be a risk for PE.  11:55 PM CT shows no evidence of PE and redemonstration of known lesions also evidence of consolidative opacity and groundglass within the aerated portions of the left lower lobe and lingula which may be postobstructive infection or inflammation.  Patient has denied fever and suspect more inflammation.  She does have a leukocytosis of 17,000 today but often will have leukocytosis of 14-15,000 fairly regularly.  Patient is still requiring 4 L of oxygen and with any movement becomes significantly tachypneic.  Will admit for further care.  MDM Number of Diagnoses or Management  Options   Amount and/or Complexity of Data Reviewed Clinical lab tests: ordered and reviewed Tests  in the radiology section of CPT: ordered and reviewed Tests in the medicine section of CPT: ordered and reviewed Decide to obtain previous medical records or to obtain history from someone other than the patient: yes Obtain history from someone other than the patient: yes Review and summarize past medical records: yes Independent visualization of images, tracings, or specimens: yes  Risk of Complications, Morbidity, and/or Mortality Presenting problems: moderate Diagnostic procedures: low Management options: low  Patient Progress Patient progress: stable    Final Clinical Impression(s) / ED Diagnoses Final diagnoses:  Hypoxia  Tachycardia  Shortness of breath    Rx / DC Orders ED Discharge Orders    None       Blanchie Dessert, MD 11/25/19 0001

## 2019-11-24 NOTE — ED Triage Notes (Signed)
Patient arrives EMS for SOB and AFIB. EMS reported afib 110-170, on oxygen. Yesterday fluid was drained off of lung 1.1 liter. FX to left ribs.   134/86 HR 92 RR 32 T: 97.4 CBG 108

## 2019-11-25 ENCOUNTER — Other Ambulatory Visit (HOSPITAL_BASED_OUTPATIENT_CLINIC_OR_DEPARTMENT_OTHER): Payer: PPO

## 2019-11-25 ENCOUNTER — Telehealth: Payer: Self-pay | Admitting: Adult Health

## 2019-11-25 DIAGNOSIS — J439 Emphysema, unspecified: Secondary | ICD-10-CM | POA: Diagnosis present

## 2019-11-25 DIAGNOSIS — G4700 Insomnia, unspecified: Secondary | ICD-10-CM | POA: Diagnosis present

## 2019-11-25 DIAGNOSIS — J910 Malignant pleural effusion: Secondary | ICD-10-CM | POA: Diagnosis present

## 2019-11-25 DIAGNOSIS — Z8509 Personal history of malignant neoplasm of other digestive organs: Secondary | ICD-10-CM | POA: Diagnosis not present

## 2019-11-25 DIAGNOSIS — Q8501 Neurofibromatosis, type 1: Secondary | ICD-10-CM

## 2019-11-25 DIAGNOSIS — Z90411 Acquired partial absence of pancreas: Secondary | ICD-10-CM | POA: Diagnosis not present

## 2019-11-25 DIAGNOSIS — C782 Secondary malignant neoplasm of pleura: Secondary | ICD-10-CM | POA: Diagnosis not present

## 2019-11-25 DIAGNOSIS — Z8249 Family history of ischemic heart disease and other diseases of the circulatory system: Secondary | ICD-10-CM | POA: Diagnosis not present

## 2019-11-25 DIAGNOSIS — Z833 Family history of diabetes mellitus: Secondary | ICD-10-CM | POA: Diagnosis not present

## 2019-11-25 DIAGNOSIS — R0602 Shortness of breath: Secondary | ICD-10-CM | POA: Diagnosis present

## 2019-11-25 DIAGNOSIS — C3490 Malignant neoplasm of unspecified part of unspecified bronchus or lung: Secondary | ICD-10-CM | POA: Diagnosis not present

## 2019-11-25 DIAGNOSIS — Z9842 Cataract extraction status, left eye: Secondary | ICD-10-CM | POA: Diagnosis not present

## 2019-11-25 DIAGNOSIS — Q283 Other malformations of cerebral vessels: Secondary | ICD-10-CM | POA: Diagnosis not present

## 2019-11-25 DIAGNOSIS — R9082 White matter disease, unspecified: Secondary | ICD-10-CM | POA: Diagnosis not present

## 2019-11-25 DIAGNOSIS — R918 Other nonspecific abnormal finding of lung field: Secondary | ICD-10-CM | POA: Diagnosis not present

## 2019-11-25 DIAGNOSIS — Z90722 Acquired absence of ovaries, bilateral: Secondary | ICD-10-CM | POA: Diagnosis not present

## 2019-11-25 DIAGNOSIS — Z90710 Acquired absence of both cervix and uterus: Secondary | ICD-10-CM | POA: Diagnosis not present

## 2019-11-25 DIAGNOSIS — J9601 Acute respiratory failure with hypoxia: Secondary | ICD-10-CM | POA: Diagnosis present

## 2019-11-25 DIAGNOSIS — I10 Essential (primary) hypertension: Secondary | ICD-10-CM | POA: Diagnosis present

## 2019-11-25 DIAGNOSIS — F17210 Nicotine dependence, cigarettes, uncomplicated: Secondary | ICD-10-CM | POA: Diagnosis present

## 2019-11-25 DIAGNOSIS — Z9049 Acquired absence of other specified parts of digestive tract: Secondary | ICD-10-CM | POA: Diagnosis not present

## 2019-11-25 DIAGNOSIS — F17200 Nicotine dependence, unspecified, uncomplicated: Secondary | ICD-10-CM

## 2019-11-25 DIAGNOSIS — C3412 Malignant neoplasm of upper lobe, left bronchus or lung: Secondary | ICD-10-CM | POA: Diagnosis not present

## 2019-11-25 DIAGNOSIS — Z806 Family history of leukemia: Secondary | ICD-10-CM | POA: Diagnosis not present

## 2019-11-25 DIAGNOSIS — Q048 Other specified congenital malformations of brain: Secondary | ICD-10-CM | POA: Diagnosis not present

## 2019-11-25 DIAGNOSIS — Z79899 Other long term (current) drug therapy: Secondary | ICD-10-CM | POA: Diagnosis not present

## 2019-11-25 DIAGNOSIS — C3492 Malignant neoplasm of unspecified part of left bronchus or lung: Secondary | ICD-10-CM | POA: Diagnosis present

## 2019-11-25 DIAGNOSIS — Z961 Presence of intraocular lens: Secondary | ICD-10-CM | POA: Diagnosis present

## 2019-11-25 DIAGNOSIS — J189 Pneumonia, unspecified organism: Secondary | ICD-10-CM | POA: Diagnosis present

## 2019-11-25 DIAGNOSIS — Z8041 Family history of malignant neoplasm of ovary: Secondary | ICD-10-CM | POA: Diagnosis not present

## 2019-11-25 DIAGNOSIS — Z20822 Contact with and (suspected) exposure to covid-19: Secondary | ICD-10-CM | POA: Diagnosis present

## 2019-11-25 DIAGNOSIS — Z85831 Personal history of malignant neoplasm of soft tissue: Secondary | ICD-10-CM | POA: Diagnosis not present

## 2019-11-25 DIAGNOSIS — G9389 Other specified disorders of brain: Secondary | ICD-10-CM | POA: Diagnosis not present

## 2019-11-25 LAB — PROCALCITONIN: Procalcitonin: <0.1 ng/mL

## 2019-11-25 LAB — COMPREHENSIVE METABOLIC PANEL
ALT: 16 U/L (ref 0–44)
AST: 19 U/L (ref 15–41)
Albumin: 2.4 g/dL — ABNORMAL LOW (ref 3.5–5.0)
Alkaline Phosphatase: 47 U/L (ref 38–126)
Anion gap: 10 (ref 5–15)
BUN: 10 mg/dL (ref 8–23)
CO2: 25 mmol/L (ref 22–32)
Calcium: 8.8 mg/dL — ABNORMAL LOW (ref 8.9–10.3)
Chloride: 100 mmol/L (ref 98–111)
Creatinine, Ser: 0.67 mg/dL (ref 0.44–1.00)
GFR calc Af Amer: >60 mL/min (ref >60)
GFR calc non Af Amer: >60 mL/min (ref >60)
Glucose, Bld: 97 mg/dL (ref 70–99)
Potassium: 4.4 mmol/L (ref 3.5–5.1)
Sodium: 135 mmol/L (ref 135–145)
Total Bilirubin: 0.6 mg/dL (ref 0.3–1.2)
Total Protein: 6 g/dL — ABNORMAL LOW (ref 6.5–8.1)

## 2019-11-25 LAB — CBC
HCT: 38.2 % (ref 36.0–46.0)
Hemoglobin: 12 g/dL (ref 12.0–15.0)
MCH: 27.3 pg (ref 26.0–34.0)
MCHC: 31.4 g/dL (ref 30.0–36.0)
MCV: 86.8 fL (ref 80.0–100.0)
Platelets: 431 10*3/uL — ABNORMAL HIGH (ref 150–400)
RBC: 4.4 MIL/uL (ref 3.87–5.11)
RDW: 14.3 % (ref 11.5–15.5)
WBC: 16.2 10*3/uL — ABNORMAL HIGH (ref 4.0–10.5)
nRBC: 0 % (ref 0.0–0.2)

## 2019-11-25 LAB — SARS CORONAVIRUS 2 BY RT PCR (HOSPITAL ORDER, PERFORMED IN ~~LOC~~ HOSPITAL LAB): SARS Coronavirus 2: NEGATIVE

## 2019-11-25 LAB — HIV ANTIBODY (ROUTINE TESTING W REFLEX): HIV Screen 4th Generation wRfx: NONREACTIVE

## 2019-11-25 MED ORDER — IPRATROPIUM-ALBUTEROL 0.5-2.5 (3) MG/3ML IN SOLN
3.0000 mL | Freq: Four times a day (QID) | RESPIRATORY_TRACT | Status: DC
  Administered 2019-11-25 – 2019-11-27 (×9): 3 mL via RESPIRATORY_TRACT
  Filled 2019-11-25 (×4): qty 3

## 2019-11-25 MED ORDER — TRAZODONE HCL 50 MG PO TABS
50.0000 mg | ORAL_TABLET | Freq: Every evening | ORAL | Status: DC | PRN
  Administered 2019-11-25 – 2019-11-26 (×2): 50 mg via ORAL
  Filled 2019-11-25 (×2): qty 1

## 2019-11-25 MED ORDER — VANCOMYCIN HCL IN DEXTROSE 1-5 GM/200ML-% IV SOLN
1000.0000 mg | Freq: Once | INTRAVENOUS | Status: AC
  Administered 2019-11-25: 1000 mg via INTRAVENOUS
  Filled 2019-11-25: qty 200

## 2019-11-25 MED ORDER — HYDROCODONE-ACETAMINOPHEN 5-325 MG PO TABS
1.0000 | ORAL_TABLET | ORAL | Status: DC | PRN
  Administered 2019-11-26 – 2019-11-27 (×5): 2 via ORAL
  Filled 2019-11-25 (×5): qty 2

## 2019-11-25 MED ORDER — KETOROLAC TROMETHAMINE 15 MG/ML IJ SOLN
15.0000 mg | Freq: Four times a day (QID) | INTRAMUSCULAR | Status: DC | PRN
  Administered 2019-11-25 – 2019-11-27 (×4): 15 mg via INTRAVENOUS
  Filled 2019-11-25 (×4): qty 1

## 2019-11-25 MED ORDER — AZITHROMYCIN 250 MG PO TABS
500.0000 mg | ORAL_TABLET | Freq: Every day | ORAL | Status: DC
  Administered 2019-11-25 – 2019-11-27 (×3): 500 mg via ORAL
  Filled 2019-11-25 (×3): qty 2

## 2019-11-25 MED ORDER — POLYETHYLENE GLYCOL 3350 17 G PO PACK: 17.0000 g | PACK | Freq: Every day | ORAL | Status: DC | PRN

## 2019-11-25 MED ORDER — ONDANSETRON HCL 4 MG PO TABS: 4.0000 mg | ORAL_TABLET | Freq: Four times a day (QID) | ORAL | Status: DC | PRN

## 2019-11-25 MED ORDER — SODIUM CHLORIDE 0.9 % IV SOLN
2.0000 g | Freq: Once | INTRAVENOUS | Status: AC
  Administered 2019-11-25: 2 g via INTRAVENOUS
  Filled 2019-11-25: qty 2

## 2019-11-25 MED ORDER — SODIUM CHLORIDE 0.9 % IV SOLN
1.0000 g | INTRAVENOUS | Status: DC
  Administered 2019-11-25 – 2019-11-27 (×2): 1 g via INTRAVENOUS
  Filled 2019-11-25 (×2): qty 10

## 2019-11-25 MED ORDER — ENSURE ENLIVE PO LIQD
237.0000 mL | Freq: Two times a day (BID) | ORAL | Status: DC
  Administered 2019-11-26 – 2019-11-27 (×4): 237 mL via ORAL

## 2019-11-25 MED ORDER — ONDANSETRON HCL 4 MG/2ML IJ SOLN: 4.0000 mg | Freq: Four times a day (QID) | INTRAMUSCULAR | Status: DC | PRN

## 2019-11-25 MED ORDER — VANCOMYCIN HCL IN DEXTROSE 1-5 GM/200ML-% IV SOLN: 1000.0000 mg | INTRAVENOUS | Status: DC

## 2019-11-25 MED ORDER — IPRATROPIUM-ALBUTEROL 0.5-2.5 (3) MG/3ML IN SOLN: 3.0000 mL | RESPIRATORY_TRACT | Status: DC | PRN

## 2019-11-25 MED ORDER — SODIUM CHLORIDE 0.9 % IV SOLN
2.0000 g | Freq: Two times a day (BID) | INTRAVENOUS | Status: DC
  Administered 2019-11-25: 2 g via INTRAVENOUS
  Filled 2019-11-25: qty 2

## 2019-11-25 MED ORDER — IBUPROFEN 200 MG PO TABS: 400.0000 mg | ORAL_TABLET | Freq: Four times a day (QID) | ORAL | Status: DC | PRN

## 2019-11-25 NOTE — Telephone Encounter (Signed)
Patient with left pleural effusion and hypermetabolic lung mass. Underwent thoracentesis on 8/2 with 1.1 L removed.  Patient calling with progressive dyspnea to point cant walk to bathroom, tightness and fatigue. Not on oxygen, has not checked O2 sats (no pulse ox) . No fever, hemoptysis or syncope.  Symptoms getting progressively worse as day has went on.   Concern for acute process , ie PTX, etc . Need urgent evaluation , advised to go to ER for evaluation.  Patient verbalized understanding .   Rexene Edison NP

## 2019-11-25 NOTE — ED Notes (Signed)
Breakfast Ordered 

## 2019-11-25 NOTE — H&P (Signed)
[facility] History and Physical  LOUCILLE TAKACH OHY:[id number] DOB: 1955/06/05 DOA: 11/24/2019  Referring physician: Dr. Dayna Barker PCP: Carollee Herter, Alferd Apa, DO   Chief Complaint: shortness of breath  HPI: [name] [name] is a 64 y.o. female with hx of neurofibromatosis, prior thoractomy, COPD, GIST of small intestine s/p resection,  current tobacco use, and recently found new L lung lesions currently undergoing workup for suspected malignancy who presents with shortness of breath.  Patient found to have new L lung mass recently along with L pleural effusion Yesterday morning underwent thoracentesis to further evaluate and reported feeling much better after the fluid was drained This morning she woke up and became extremely short of breath and was unable to do basic things without becoming winded She reports having a chronic cough which has gotten dryer recently with less sputum production She denies any fevers She and her husband called EMS and were brought to [facility] ED  On arrival she was found to be extremely tachypneic and only mildly hypoxic to low-mid 90's, with both of these VS improving after being placed on 4L O2 (not on oxygen at home at baseline. Workup initiated with elevated WBC 17.8 (though chronically mildly elevated), unremarkable BMP. Labs from thoracentesis day prior most c/w exudative effusion though all results not yet back. CTPA pursued which showed no PE, some re-accumulation of pleural effusion with loculations and "Consolidative opacity and ground-glass within the aerated portions of the left lower lobe and lingula likely reflect some postobstructive infection or inflammation."  Patient admitted for hypoxemic respiratory failure.    Review of Systems:  Pertinent positives and negative per HPI, all others reviewed and negative   Past Medical History:  Diagnosis Date  . Allergy   . 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 Carcinoid tumor of abdomen   . Cataract    both  eyes  . Family history of adverse reaction to anesthesia    Mother had trouble waking up  . Hypertension   . Lower back pain   . Neurofibromatosis, type I (von Recklinghausen's disease) ([place])   . Pneumothorax on left   . Pneumothorax on right    Past Surgical History:  Procedure Laterality Date  . ABDOMINAL HYSTERECTOMY  04/23/1997   fibroids  TAH/ BSO  . CHOLECYSTECTOMY    . COLONOSCOPY  2006  . EYE SURGERY Left 11/2015   cataract and astigmatism surgery with Intraocular Lens left eye  . LASIK    . LUNG SURGERY  2008?   d/t pneumothroax  . MEMBRANE PEEL Left 08/16/2014   Procedure: MEMBRANE PEEL LEFT EYE ;  Surgeon: Sherlynn Stalls, MD;  Location: [place];  Service: Ophthalmology;  Laterality: Left;  . OOPHORECTOMY    . PARS PLANA VITRECTOMY Left 08/16/2014   Procedure: PARS PLANA VITRECTOMY WITH 25 GAUGE;  Surgeon: Sherlynn Stalls, MD;  Location: [place];  Service: Ophthalmology;  Laterality: Left;  . TONSILLECTOMY    . WHIPPLE PROCEDURE     Social History:  reports that she has been smoking cigarettes. She has a 23.50 pack-year smoking history. She has never used smokeless tobacco. She reports current alcohol use. She reports that she does not use drugs.  No Known Allergies  Family History  Problem Relation Age of Onset  . Cancer Mother   . Cancer Father   . Heart disease Father   . Cancer Sister   . Heart disease Sister   . Dementia Sister   . Diabetes Brother   .  Colon cancer Neg Hx      Prior to Admission medications   Medication Sig Start Date End Date Taking? Authorizing Provider  acetaminophen (TYLENOL) 500 MG tablet Take 500-1,000 mg by mouth every 6 (six) hours as needed for mild pain or headache.   Yes [provider]  Cholecalciferol (D3) 50 MCG (2000 UT) TABS Take 2,000 Units by mouth daily with breakfast.    Yes [provider]  HYDROcodone-acetaminophen (NORCO/VICODIN) 5-325 MG tablet Take 1-2 tablets by mouth 3 (three) times daily as needed for  up to 6 days. 11/19/19 11/25/19 Yes Roma Schanz R, DO  metoprolol tartrate (LOPRESSOR) 25 MG tablet Take 0.5 tablets (12.5 mg total) by mouth 2 (two) times daily. 09/28/19 12/27/19 Yes Minus Breeding, MD  Multiple Vitamins-Minerals (MULTIVITAMINS THER. W/MINERALS) TABS Take 1 tablet by mouth daily.   Yes [provider]   Physical Exam: Vitals:   11/24/19 1946 11/24/19 2000 11/24/19 2215 11/24/19 2230  BP:  (!) 120/104 137/79 135/71  Pulse:  88 83 74  Resp:  20 18 (!) 27  Temp:      TempSrc:      SpO2:  97% 98% 97%  Weight: 49 kg     Height: 5' 2.5" (1.588 m)       Wt Readings from Last 3 Encounters:  11/24/19 49 kg  11/11/19 49.1 kg  10/21/19 51.7 kg    General:  Appears calm and comfortable Eyes: PERRL, normal lids, irises & conjunctiva ENT: grossly normal hearing, lips & tongue Cardiovascular: RRR, no m/r/g. No LE edema. Respiratory: Comfortable on 4L [place]. Normal respiratory effort. Rales in LLL. Abdomen: soft, mild ttp in LUQ Skin: innumerable skin lesions of NF Musculoskeletal: grossly normal tone BUE/BLE Psychiatric: grossly normal mood and affect, speech fluent and appropriate Neurologic: grossly non-focal.          Labs on Admission:  Basic Metabolic Panel: Recent Labs  Lab 11/24/19 1958  NA 134*  K 3.9  CL 98  CO2 21*  GLUCOSE 117*  BUN 9  CREATININE 0.60  CALCIUM 9.0   Liver Function Tests: No results for input(s): AST, ALT, ALKPHOS, BILITOT, PROT, ALBUMIN in the last 168 hours. No results for input(s): LIPASE, AMYLASE in the last 168 hours. No results for input(s): AMMONIA in the last 168 hours. CBC: Recent Labs  Lab 11/24/19 1958  WBC 17.8*  NEUTROABS 12.3*  HGB 13.3  HCT 40.8  MCV 84.8  PLT 447*   Cardiac Enzymes: No results for input(s): CKTOTAL, CKMB, CKMBINDEX, TROPONINI in the last 168 hours.  BNP (last 3 results) No results for input(s): BNP in the last 8760 hours.  ProBNP (last 3 results) No results for input(s):  PROBNP in the last 8760 hours.  CBG: No results for input(s): GLUCAP in the last 168 hours.  Radiological Exams on Admission: DG Chest 1 View  Result Date: 11/23/2019 CLINICAL DATA:  Post left thoracentesis EXAM: CHEST  1 VIEW COMPARISON:  Chest CT 11/12/2019 FINDINGS: No pneumothorax following left thoracentesis. Airspace disease in the left lower lobe. Underlying apical emphysematous changes and scarring. Heart is normal size. IMPRESSION: No pneumothorax following thoracentesis. Electronically Signed   By: Rolm Baptise M.D.   On: 11/23/2019 10:25   CT Angio Chest PE W and/or Wo Contrast  Result Date: 11/24/2019 CLINICAL DATA:  Shortness of breath and tachycardia, thoracentesis yesterday EXAM: CT ANGIOGRAPHY CHEST WITH CONTRAST TECHNIQUE: Multidetector CT imaging of the chest was performed using the standard protocol during bolus administration  of intravenous contrast. Multiplanar CT image reconstructions and MIPs were obtained to evaluate the vascular anatomy. CONTRAST:  86mL OMNIPAQUE IOHEXOL 350 MG/ML SOLN COMPARISON:  PET-CT 11/17/2019, radiograph 11/24/2019, CT chest 11/12/2019 FINDINGS: Cardiovascular: Satisfactory opacification the pulmonary arteries to the segmental level. No pulmonary artery filling defects are identified. Tapering of the pulmonary arteries as they enter the region of the left lower lobe mass and hilar invasion detailed below. Central pulmonary arteries are otherwise normal caliber. The aortic root is suboptimally assessed given cardiac pulsation artifact. Atherosclerotic plaque within the normal caliber aorta. No definite acute luminal abnormality within limitations of this non tailored evaluation. Atherosclerotic calcification at the origins of the great vessels including a shared origin of the left common carotid and brachiocephalic artery. Normal cardiac size. No visible pericardial effusion. For the discernible fat plane between the left ventricular free wall and the  adjacent mediastinal mass seen on comparison CT and PET though this can be partially attributable to cardiac motion and volume averaging. Mediastinum/Nodes: There is extensive direct invasion of a left lower lobe mass extending throughout the soft tissues of the left hilum and mediastinum demonstrating hypermetabolism on the comparison PET-CT compatible with a primary lung neoplasm. Previously FDG avid enlarged AP window lymph nodes measuring up to 2.3 cm (6/71) are similar to mildly increased in size from comparison. No acute abnormality of the trachea. Loss of discernible fat plane between the mediastinal mass and the thoracic esophagus. No concerning thyroid nodules. Lungs/Pleura: Redemonstration the heterogeneous Lea enhancing lesion seen centrally in the left lower lobe and extending into the medial aspect of the lingula/left upper lobe and invading the left hilum and extending along the mediastinal borders. Additional extensive FDG avid pleural nodularity is present as well. There is a persistent moderate size loculated malignant effusion. The central airways, pulmonary arteries appear mildly tapered with truncation of the pulmonary veins with narrowing and segmental occlusions of the central airways the lingula and left lower lobe. Additional consolidative opacity and ground-glass within the aerated portions of the left lower lobe and lingula likely reflect some postobstructive infection or inflammation. There are remote postsurgical changes in the bilateral lung apices with evidence of prior thoracotomy. Findings are on a background of diffuse moderate centrilobular and severe paraseptal emphysematous change. Chronic biapical areas of scarring are noted. Upper Abdomen: Pneumobilia noted in the liver. No other acute upper abdominal abnormality is evident. Musculoskeletal: Evidence of prior bilateral thoracotomy widening of the second interspaces bilaterally. No evidence of direct chest wall invasion at this  time. Body wall edema is noted. No evidence of osseous metastatic disease. Review of the MIP images confirms the above findings. IMPRESSION: 1. No evidence of acute pulmonary artery filling defects. 2. Redemonstration of the heterogeneous enhancing lesion centrally in the left lower lobe and extending into the medial aspect of the lingula/left upper lobe as well as directly invading the left hilum and extending along the mediastinal borders. Findings compatible with primary lung neoplasm similar to slightly increased in size. 3. Redemonstration of the previously FDG avid pleural nodularity with a moderate persistent left malignant effusion with loculation. 4. Previously FDG avid enlarged AP window lymph nodes are similar to mildly increased in size from comparison PET-CT. 5. Left lung lesion with hilar invasion results in some narrowing of the pulmonary arteries, occlusion of the pulmonary veins and segmental occlusions of the central airways of the left lower lobe and lingula. Consolidative opacity and ground-glass within the aerated portions of the left lower lobe and lingula likely  reflect some postobstructive infection or inflammation. 6. Evidence of prior bilateral thoracotomy widening of the second interspaces and postsurgical changes in the lung apices. 7. Pneumobilia in the liver, correlate for prior sphincterotomy. 8. Aortic Atherosclerosis (ICD10-I70.0) 9. Emphysema (ICD10-J43.9). Electronically Signed   By: Lovena Le M.D.   On: 11/24/2019 23:10   DG Chest Port 1 View  Result Date: 11/24/2019 CLINICAL DATA:  Shortness of breath. EXAM: PORTABLE CHEST 1 VIEW COMPARISON:  November 23, 2019 FINDINGS: Marked severity atelectasis and/or infiltrate is seen within the left lung base. This is increased in severity when compared to the prior study. There is a small left pleural effusion which is also increased in size. No pneumothorax is identified. The heart size and mediastinal contours are within normal  limits. A small deformity is seen along the lateral aspect of the upper left chest wall, with subsequent herniation of a small amount of left upper lobe. This is present on the prior study. There is mild dextroscoliosis of the midthoracic spine. IMPRESSION: 1. Marked severity left basilar atelectasis and/or infiltrate, increased in severity when compared to the prior study. 2. Small left pleural effusion which is increased in size. Electronically Signed   By: Virgina Norfolk M.D.   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 Request received for diagnostic and therapeutic left thoracentesis. EXAM: ULTRASOUND GUIDED DIAGNOSTIC AND THERAPEUTIC LEFT THORACENTESIS MEDICATIONS: 1% lidocaine to skin and subcutaneous tissue COMPLICATIONS: None immediate. PROCEDURE: An ultrasound guided thoracentesis was thoroughly discussed with the patient and questions answered. The benefits, risks, alternatives and complications were also discussed. The patient understands and wishes to proceed with the procedure. Written consent was obtained. Ultrasound was performed to localize and mark an adequate pocket of fluid in the left chest. The area was then prepped and draped in the normal sterile fashion. 1% Lidocaine was used for local anesthesia. Under ultrasound guidance a 6 Fr Safe-T-Centesis catheter was introduced. Thoracentesis was performed. The catheter was removed and a dressing applied. FINDINGS: A total of approximately 1.1 liters of slightly hazy, yellow fluid was removed. Samples were sent to the laboratory as requested by the clinical team. IMPRESSION: Successful ultrasound guided diagnostic and therapeutic left thoracentesis yielding 1.1 liters of pleural fluid. Read by: Rowe Robert, PA-C Electronically Signed   By:  Jacqulynn Cadet M.D.   On: 11/23/2019 11:13    EKG: Independently reviewed. Poor quality ECG. Sinus rhythm. Normal axis. No ischemic changes.  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 Given timing of symptoms c/f possible re-expansion pulmonary edema given that her white count is not significant elevated and not having other systemic signs of infection; imaging findings would also be consistent. That being said given high suspicion for malignancy she is high risk for infection and I will cover her empirically at this time, I have ordered a procal to see if this will help determine etiology as well. She has not yet been evaluated by  Pulm or Onc and may benefit from consultation while she is admitted as well as she does not yet have tissue diagnosis.  - Vanc/Cefepime - BCx x2 - follow up procalcitonin - follow up results of thoracentesis fluid analysis - cont supplemental O2 PRN - Pulmonology consult in AM to help determine etiology of symptoms as well as consideration for repeat thoracentesis/chest tube vs pleurex given rapid reaccumulation of pleural effusion - consider expedited workup of lung mass while inpatient with IR consultation for biopsy/Oncology consult   Code Status: Full Code confirmed DVT Prophylaxis: SCD's Family Communication: husband at bedside Disposition Plan: Inpatient  Time spent: 20 min  Clarnce Flock MD/MPH [facility]

## 2019-11-25 NOTE — Progress Notes (Signed)
Pharmacy Antibiotic Note   [name] [name] is a 64 y.o. female admitted on 11/24/2019 with sepsis.  Pharmacy has been consulted for Vancomycin/Cefepime dosing. ?pulmonary source. WBC elevated. New lung mass.   Plan: Vancomycin 1000 mg IV q24h Cefepime 2g IV q12h Trend WBC, temp, renal function  F/U infectious work-up Drug levels as indicated  Height: 5' 2.5" (158.8 cm) Weight: 49 kg (108 lb) IBW/kg (Calculated) : 51.25  Temp (24hrs), Avg:98.1 F (36.7 C), Min:98.1 F (36.7 C), Max:98.1 F (36.7 C)  Recent Labs  Lab 11/24/19 1958  WBC 17.8*  CREATININE 0.60    Estimated Creatinine Clearance: 53.5 mL/min (by C-G formula based on SCr of 0.6 mg/dL).    No Known Allergies  Narda Bonds, PharmD, BCPS Clinical Pharmacist Phone: [phone]

## 2019-11-25 NOTE — Progress Notes (Signed)
PROGRESS NOTE    [name] [name]  VQQ:[id number] DOB: Jul 14, 1955 DOA: 11/24/2019 PCP: [name], DO    Brief Narrative:  Patient admitted to the hospital working diagnosis of acute hypoxic respiratory failure due to pleural effusion and lung mass.  64 year old female past medical history for neurofibromatosis, COPD, tobacco abuse recently diagnosed left lung lesions who presented with worsening dyspnea.  24 hours prior to hospitalization patient underwent a diagnostic/therapeutic thoracentesis with improvement of her symptoms.  The following day, she had recurrent severe dyspnea that prompted her to come to the hospital.  On her initial physical examination her oximetry was 90% on room air, her lungs had Rales at the left lower lobe, no wheezing, heart S1-S2, present rhythm, soft abdomen, no lower extremity edema. Sodium 134, potassium 3.9, chloride 98, bicarb 21, glucose 117, BUN 9, creatinine 0.60, white count 17.8, hemoglobin 13.3, hematocrit 40.8, platelets 447. 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 Acute hypoxic respiratory failure due to left lower lobe post-obstructive pneumonia with left para-pneumonic effusion (present on admission). Patient continue to have dyspnea, not yet back to baseline. Oxymetry today 96% on 4 L per [place]. Continue elevation in wbc at 16,2. Cultures continue with no growth.   Will continue antibiotic therapy with ceftriaxone and azithromycin, I doubt patient has multidrug resistant bacteria at this point.  Add bronchodilator therapy, along with antitussive agents and airway  clearing techniques with flutter valve and incentive spirometer.    2. COPD. Extensive emphysema with blebs, personally reviewed chest CT, no significant wheezing or increase mucous production. No clinical signs of exacerbation.  Will continue bronchodilator therapy and airway clearing techniques, will hold on steroids for now.  3. Left lung mass/ sp left thoracentesis. Patient follows with pulmonary as outpatient. Will let primary pulmonary team know patient is in the hospital, in case further workup needed, like inpatient bronchoscopy.   Pleural fluid, consistent with exudate, high LDH, positive wbc 3,425, PMN 1 Lymph 7, other 86% Follow up on cytology possible malignancy.   4. Insomnia. Continue trazodone at night.   Patient continue to be at high risk for worsening hypoxic respiratory failure   Status is: Inpatient  Remains inpatient appropriate because:Inpatient level of care appropriate due to severity of illness   Dispo: The patient is from: Home              Anticipated d/c is to: Home              Anticipated d/c date is: 3 days              Patient currently is not medically stable to d/c.   DVT prophylaxis: Enoxaparin   Code Status:   full  Family Communication:  I spoke with patient's brohter at the bedside, we talked in detail about patient's condition, plan of care and prognosis and all questions were addressed.     Antimicrobials:   Ceftriaxone   Azithromycin   Dc cefepime and vancomycin     Subjective: Patient continue to have dyspnea, not back to baseline, no chest pain or increase cough.   Objective: Vitals:   11/25/19  0300 11/25/19 0400 11/25/19 0500 11/25/19 0600  BP: 138/69 (!) 144/77 (!) 145/63 126/70  Pulse: 75 81 64 69  Resp: (!) 22 (!) 22 (!) 21 (!) 22  Temp:      TempSrc:      SpO2: 97% 96% 96% 99%  Weight:      Height:       No intake or output data in the 24 hours ending 11/25/19 1008 Filed Weights   11/24/19 1946  Weight: 49 kg      Examination:   General: deconditioned and ill looking appearing  Neurology: Awake and alert, non focal  E ENT: mild pallor, no icterus, oral mucosa dry.  Cardiovascular: No JVD. S1-S2 present, rhythmic, no gallops, rubs, or murmurs. No lower extremity edema. Pulmonary: positive breath sounds bilaterally, positive rales at the left base with decrease breath sounds Gastrointestinal. Abdomen soft and non tender Skin. No rashes Musculoskeletal: no joint deformities     Data Reviewed: I have personally reviewed following labs and imaging studies  CBC: Recent Labs  Lab 11/24/19 1958 11/25/19 0408  WBC 17.8* 16.2*  NEUTROABS 12.3*  --   HGB 13.3 12.0  HCT 40.8 38.2  MCV 84.8 86.8  PLT 447* 810*   Basic Metabolic Panel: Recent Labs  Lab 11/24/19 1958 11/25/19 0408  NA 134* 135  K 3.9 4.4  CL 98 100  CO2 21* 25  GLUCOSE 117* 97  BUN 9 10  CREATININE 0.60 0.67  CALCIUM 9.0 8.8*   GFR: Estimated Creatinine Clearance: 53.5 mL/min (by C-G formula based on SCr of 0.67 mg/dL). Liver Function Tests: Recent Labs  Lab 11/25/19 0408  AST 19  ALT 16  ALKPHOS 47  BILITOT 0.6  PROT 6.0*  ALBUMIN 2.4*   No results for input(s): LIPASE, AMYLASE in the last 168 hours. No results for input(s): AMMONIA in the last 168 hours. Coagulation Profile: No results for input(s): INR, PROTIME in the last 168 hours. Cardiac Enzymes: No results for input(s): CKTOTAL, CKMB, CKMBINDEX, TROPONINI in the last 168 hours. BNP (last 3 results) No results for input(s): PROBNP in the last 8760 hours. HbA1C: No results for input(s): HGBA1C in the last 72 hours. CBG: No results for input(s): GLUCAP in the last 168 hours. Lipid Profile: No results for input(s): CHOL, HDL, LDLCALC, TRIG, CHOLHDL, LDLDIRECT in the last 72 hours. Thyroid Function Tests: No results for input(s): TSH, T4TOTAL, FREET4, T3FREE, THYROIDAB in the last 72 hours. Anemia Panel: No results for input(s): VITAMINB12,  FOLATE, FERRITIN, TIBC, IRON, RETICCTPCT in the last 72 hours.    Radiology Studies: I have reviewed all of the imaging during this hospital visit personally     Scheduled Meds: Continuous Infusions: . ceFEPime (MAXIPIME) IV    . vancomycin       LOS: 0 days        Paxten Appelt Gerome Apley, MD

## 2019-11-25 NOTE — ED Notes (Signed)
Lunch Tray Ordered @ 1059.  

## 2019-11-26 ENCOUNTER — Encounter (HOSPITAL_COMMUNITY): Payer: Self-pay | Admitting: Family Medicine

## 2019-11-26 ENCOUNTER — Telehealth: Payer: Self-pay | Admitting: *Deleted

## 2019-11-26 DIAGNOSIS — R918 Other nonspecific abnormal finding of lung field: Secondary | ICD-10-CM

## 2019-11-26 LAB — BODY FLUID CULTURE: Culture: NO GROWTH

## 2019-11-26 MED ORDER — ADULT MULTIVITAMIN W/MINERALS CH
1.0000 | ORAL_TABLET | Freq: Every day | ORAL | Status: DC
  Administered 2019-11-26 – 2019-11-27 (×2): 1 via ORAL
  Filled 2019-11-26 (×2): qty 1

## 2019-11-26 NOTE — Progress Notes (Signed)
PROGRESS NOTE    TREAZURE NERY  TDD:[id number] DOB: Mar 31, 1956 DOA: 11/24/2019 PCP: [name], DO    Brief Narrative:  Patient admitted to the hospital working diagnosis of acute hypoxic respiratory failure due to pleural effusion and lung mass.  64 year old female past medical history for neurofibromatosis, COPD, tobacco abuse recently diagnosed left lung lesions who presented with worsening dyspnea.  24 hours prior to hospitalization patient underwent a diagnostic/therapeutic thoracentesis with improvement of her symptoms.  The following day, she had recurrent severe dyspnea that prompted her to come to the hospital.  On her initial physical examination her oximetry was 90% on room air, her lungs had Rales at the left lower lobe, no wheezing, heart S1-S2, present rhythm, soft abdomen, no lower extremity edema. Sodium 134, potassium 3.9, chloride 98, bicarb 21, glucose 117, BUN 9, creatinine 0.60, white count 17.8, hemoglobin 13.3, hematocrit 40.8, platelets 447. 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 Acute hypoxic respiratory failure due to left lower lobe post-obstructive pneumonia with left para-pneumonic effusion (present on admission). Persistent dyspnea on exertion, her oxymetry today 97% on 2 L per [place]. Cultures remain with no growth.  Tolerating well antibiotic therapy with ceftriaxone and azithromycin. Continue bronchodilator therapy, antitussive agents and airway clearing techniques with flutter valve and incentive spirometer.    2. COPD. Extensive emphysema with blebs, personally reviewed  chest CT, Continue with no clinical signs of acute exacerbation.  On bronchodilator therapy and airway clearing techniques.  3. Left lung mass/ sp left thoracentesis.  Pleural fluid, consistent with exudate, high LDH, positive wbc 3,425, PMN 1 Lymph 7, other 86%  Cytology with malignant cells, possible small cell lung cancer.    Case discussed with Dr. Lamonte Sakai from pulmonary, with recommendations for further oncology evaluation.   4. Insomnia. On trazodone at night    Status is: Inpatient  Remains inpatient appropriate because:IV treatments appropriate due to intensity of illness or inability to take PO   Dispo: The patient is from: Home              Anticipated d/c is to: Home              Anticipated d/c date is: 2 days              Patient currently is not medically stable to d/c.    DVT prophylaxis: Enoxaparin   Code Status:   full  Family Communication:  I spoke with patient's brother at the bedside, we talked in detail about patient's condition, plan of care and prognosis and all questions were addressed.     Consultants:   Pulmonary   Oncology      Antimicrobials:   Ceftriaxone and azithromycin     Subjective: Patient continue to have dyspnea on exertion, no nausea or vomiting, no diarrhea.   Objective: Vitals:   11/25/19 1846 11/25/19 2004 11/25/19 2116 11/26/19 0738  BP: (!) 158/81  (!) 150/73 138/70  Pulse: 88  86 92  Resp: (!) 22  16 16   Temp: 97.9 F (36.6 C)  97.7 F (36.5 C) 98.3 F (36.8 C)  TempSrc: Oral  Oral  Oral  SpO2: 98% 95% 97% 97%  Weight:   79.2 kg   Height:        Intake/Output Summary (Last 24 hours) at 11/26/2019 0951 Last data filed at 11/25/2019 2305 Gross per 24 hour  Intake 100 ml  Output --  Net 100 ml   Filed Weights   11/24/19 1946 11/25/19 2116  Weight: 49 kg 79.2 kg    Examination:   General: deconditioned  Neurology: Awake and alert, non focal  E ENT: mild pallor, no icterus, oral mucosa  moist Cardiovascular: No JVD. S1-S2 present, rhythmic, no gallops, rubs, or murmurs. No lower extremity edema. Pulmonary: positive breath sounds bilaterally, decreased bilateral air movement, Gastrointestinal. Abdomen  Soft and non tender Skin. No rashes Musculoskeletal: no joint deformities     Data Reviewed: I have personally reviewed following labs and imaging studies  CBC: Recent Labs  Lab 11/24/19 1958 11/25/19 0408  WBC 17.8* 16.2*  NEUTROABS 12.3*  --   HGB 13.3 12.0  HCT 40.8 38.2  MCV 84.8 86.8  PLT 447* 275*   Basic Metabolic Panel: Recent Labs  Lab 11/24/19 1958 11/25/19 0408  NA 134* 135  K 3.9 4.4  CL 98 100  CO2 21* 25  GLUCOSE 117* 97  BUN 9 10  CREATININE 0.60 0.67  CALCIUM 9.0 8.8*   GFR: Estimated Creatinine Clearance: 68.3 mL/min (by C-G formula based on SCr of 0.67 mg/dL). Liver Function Tests: Recent Labs  Lab 11/25/19 0408  AST 19  ALT 16  ALKPHOS 47  BILITOT 0.6  PROT 6.0*  ALBUMIN 2.4*   No results for input(s): LIPASE, AMYLASE in the last 168 hours. No results for input(s): AMMONIA in the last 168 hours. Coagulation Profile: No results for input(s): INR, PROTIME in the last 168 hours. Cardiac Enzymes: No results for input(s): CKTOTAL, CKMB, CKMBINDEX, TROPONINI in the last 168 hours. BNP (last 3 results) No results for input(s): PROBNP in the last 8760 hours. HbA1C: No results for input(s): HGBA1C in the last 72 hours. CBG: No results for input(s): GLUCAP in the last 168 hours. Lipid Profile: No results for input(s): CHOL, HDL, LDLCALC, TRIG, CHOLHDL, LDLDIRECT in the last 72 hours. Thyroid Function Tests: No results for input(s): TSH, T4TOTAL, FREET4, T3FREE, THYROIDAB in the last 72 hours. Anemia Panel: No results for input(s): VITAMINB12, FOLATE, FERRITIN, TIBC, IRON, RETICCTPCT in the last 72 hours.    Radiology Studies: I have reviewed all of the imaging during this hospital visit personally     Scheduled  Meds: . azithromycin  500 mg Oral Daily  . feeding supplement (ENSURE ENLIVE)  237 mL Oral BID BM  . ipratropium-albuterol  3 mL Nebulization QID   Continuous Infusions: . cefTRIAXone (ROCEPHIN)  IV 1 g (11/25/19 2305)     LOS: 1 day        Sakiyah Shur Gerome Apley, MD

## 2019-11-26 NOTE — Progress Notes (Addendum)
Initial Nutrition Assessment  DOCUMENTATION CODES:   Not applicable  INTERVENTION:    Ensure Enlive po BID, each supplement provides 350 kcal and 20 grams of protein  MVI daily   NUTRITION DIAGNOSIS:   Increased nutrient needs related to acute illness as evidenced by estimated needs.  GOAL:   Patient will meet greater than or equal to 90% of their needs  MONITOR:   PO intake, Supplement acceptance, Weight trends, Labs, I & O's  REASON FOR ASSESSMENT:   Malnutrition Screening Tool    ASSESSMENT:   Patient with PMH significant for neurofibromatosis, prior thoracotomy, COPD, GIST of small intestine s/p resection, and new found L lung lesion. Presents this admission with pleural effusion.   Pt endorses a slight decline in appetite since undergoing [facility] in 2011. States she sticks to small more frequent meals that consist of shrimp, chicken, rice, pudding, donuts, and apple sauce. Does not use supplementation at home but has been taking them this admission. Discussed the importance of protein intake for preservation of lean body mass. Pt willing to add protein rich sources and continue with supplementation.   Reports a UBW of 115 lb and an unintentional wt loss of 7 lb in the last month. Records indicate pt weighed 49.1 kg on 7/21 and show an actual weight of 79.4 kg this admission. Suspect this is a bed weighing error. Will need to obtain standing weight for accurate measurement of weight loss.   Pt likely at risk for malnutrition. Will need to obtain NFPE if possible.   Medications: reviewed  Labs: reviewed   Diet Order:   Diet Order            Diet regular Room service appropriate? Yes; Fluid consistency: Thin  Diet effective now                 EDUCATION NEEDS:   Education needs have been addressed  Skin:  Skin Assessment: Reviewed RN Assessment  Last BM:  PTA  Height:   Ht Readings from Last 1 Encounters:  11/24/19 5' 2.5" (1.588 m)    Weight:    Wt Readings from Last 1 Encounters:  11/25/19 79.2 kg    BMI:  Body mass index is 31.43 kg/m.  Estimated Nutritional Needs:   Kcal:  1600-1800 kcal  Protein:  80-95 grams  Fluid:  >/= 1.6 L/day   Mariana Single RD, LDN Clinical Nutrition Pager listed in [place]

## 2019-11-26 NOTE — Consult Note (Signed)
NAME:  [name] [name], MRN:  [id number], DOB:  Jul 12, 1955, LOS: 1 ADMISSION DATE:  11/24/2019, CONSULTATION DATE:  8/5 REFERRING MD:  Dr. Cathlean Sauer, CHIEF COMPLAINT:  Lung mass   Brief History   64 year old female with COPD presented to [facility] ED 8/4 with dyspnea. Lung mass with postobstructive pneumonia and pleural effusion identified on CT.   History of present illness   64 year old female with PMH as below, which is significant for current everyday smoker, COPD, bullous emphysema with history of PTX, HTN, and neurofibromatosis. She has not been on her scheduled nebulizers since 2018 due to insurance issues. She has remained in her usual state of health until about May of this year when she began to develop palpitations. She sought cardiology consultation and she was started on metoprolol. Course then complicated by progressive dyspnea and weakness. She developed L flank/abdominal pain and CT scan was ordered for 7/22, which showed several soft tissue masses in the lungs and effusion on the L. She was sent for thoracentesis on 8/2 with near resolution of her symptoms. Symptoms rapidly returned prompting [facility] admission on 8/4. PCCM consulted 8/5 for lung mass.   Past Medical History   has a past medical history of Allergy, Benign tumor, Carcinoid tumor of abdomen, Cataract, Family history of adverse reaction to anesthesia, Hypertension, Lower back pain, Neurofibromatosis, type I (von Recklinghausen's disease) ([place]), Pneumothorax on left, and Pneumothorax on right.   Significant Hospital Events     Consults:    Procedures:    Significant Diagnostic Tests:  8/2 Thoracentesis >>> 8/3 CT chest > No pulmonary artery filing defects. Re-demonstration of enhancing lesions in the LLL. Left lung lesion with hilar invasion results in some narrowing of the pulmonary arteries, occlusion of the pulmonary veins and segmental occlusions of the central airways of the left lower lobe and lingula.  Consolidative opacity and ground-glass within the aerated portions of the left lower lobe and lingula likely reflect some postobstructive infection or inflammation.   Micro Data:  Blood 8/4 >  Antimicrobials:  CTX 8/4 >  Azithromycin 8/4>  Interim history/subjective:    Objective   Blood pressure 138/70, pulse 92, temperature 98.3 F (36.8 C), temperature source Oral, resp. rate 16, height 5' 2.5" (1.588 m), weight 79.2 kg, SpO2 97 %.        Intake/Output Summary (Last 24 hours) at 11/26/2019 1326 Last data filed at 11/25/2019 2305 Gross per 24 hour  Intake 100 ml  Output --  Net 100 ml   Filed Weights   11/24/19 1946 11/25/19 2116  Weight: 49 kg 79.2 kg    Examination: General: thin adult female in NAD HENT: [place]/AT, PERRL, no JVD, neurofibromatosis associated lesions.  Lungs: Clear bilateral breath sounds Cardiovascular: RRR, no MRG Abdomen: Soft, non-tender, non-distended Extremities: No acute deformity or ROM limitation Neuro: Alert, oriented, nonfocal.   Resolved Hospital Problem list     Assessment & Plan:   Left lung mass: multiple masses with hilar involvement and some narrowing of the pulmonary artery. Complicated by postobstructive pneumonia and left sided pleural effusion, which is exudative by LDH and almost certainly malignant in nature.  - Await cytology results from 8/2. Expect results in next 24-48 hours.  - If cytology negative we can either repeat thoracentesis or arrange for bronchoscopy with EBUS for tissue diagnosis.  - She is wondering if this can be done as an outpatient, which seems reasonable. Ideally she will remain inpatient until cytology results so  next steps can be planned prior to discharge.  - PRN ketorolac for pain   COPD without acute exacerbation - continue home duonebs - add back home budesonide nebs  Active smoker - Cessation counseling given.    Labs   CBC: Recent Labs  Lab 11/24/19 1958 11/25/19 0408  WBC 17.8* 16.2*   NEUTROABS 12.3*  --   HGB 13.3 12.0  HCT 40.8 38.2  MCV 84.8 86.8  PLT 447* 431*    Basic Metabolic Panel: Recent Labs  Lab 11/24/19 1958 11/25/19 0408  NA 134* 135  K 3.9 4.4  CL 98 100  CO2 21* 25  GLUCOSE 117* 97  BUN 9 10  CREATININE 0.60 0.67  CALCIUM 9.0 8.8*   GFR: Estimated Creatinine Clearance: 68.3 mL/min (by C-G formula based on SCr of 0.67 mg/dL). Recent Labs  Lab 11/24/19 1958 11/25/19 0038 11/25/19 0408  PROCALCITON  --  <0.10  --   WBC 17.8*  --  16.2*    Liver Function Tests: Recent Labs  Lab 11/25/19 0408  AST 19  ALT 16  ALKPHOS 47  BILITOT 0.6  PROT 6.0*  ALBUMIN 2.4*   No results for input(s): LIPASE, AMYLASE in the last 168 hours. No results for input(s): AMMONIA in the last 168 hours.  ABG    Component Value Date/Time   PHART 7.414 10/10/2015 0930   PCO2ART 35.4 10/10/2015 0930   PO2ART 83.2 10/10/2015 0930   HCO3 22.2 10/10/2015 0930   TCO2 19.7 10/10/2015 0930   ACIDBASEDEF 1.3 10/10/2015 0930   O2SAT 96.4 10/10/2015 0930     Coagulation Profile: No results for input(s): INR, PROTIME in the last 168 hours.  Cardiac Enzymes: No results for input(s): CKTOTAL, CKMB, CKMBINDEX, TROPONINI in the last 168 hours.  HbA1C: No results found for: HGBA1C  CBG: No results for input(s): GLUCAP in the last 168 hours.  Review of Systems:   Bolds are positive  Constitutional: weight loss, gain, night sweats, Fevers, chills, fatigue .  HEENT: headaches, Sore throat, sneezing, nasal congestion, post nasal drip, Difficulty swallowing, Tooth/dental problems, visual complaints visual changes, ear ache CV:  chest pain, radiates:,Orthopnea, PND, swelling in lower extremities, dizziness, palpitations, syncope.  GI  heartburn, indigestion, abdominal pain, nausea, vomiting, diarrhea, change in bowel habits, loss of appetite, bloody stools.  Resp: cough, productive:, hemoptysis, dyspnea, chest pain, pleuritic.  Skin: rash or itching or  icterus GU: dysuria, change in color of urine, urgency or frequency. flank pain, hematuria  MS: joint pain or swelling. decreased range of motion  Psych: change in mood or affect. depression or anxiety.  Neuro: difficulty with speech, weakness, numbness, ataxia    Past Medical History  She,  has a past medical history of Allergy, Benign tumor, Carcinoid tumor of abdomen, Cataract, Family history of adverse reaction to anesthesia, Hypertension, Lower back pain, Neurofibromatosis, type I (von Recklinghausen's disease) ([place]), Pneumothorax on left, and Pneumothorax on right.   Surgical History    Past Surgical History:  Procedure Laterality Date  . ABDOMINAL HYSTERECTOMY  04/23/1997   fibroids  TAH/ BSO  . CHOLECYSTECTOMY    . COLONOSCOPY  2006  . EYE SURGERY Left 11/2015   cataract and astigmatism surgery with Intraocular Lens left eye  . LASIK    . LUNG SURGERY  2008?   d/t pneumothroax  . MEMBRANE PEEL Left 08/16/2014   Procedure: MEMBRANE PEEL LEFT EYE ;  Surgeon: Sherlynn Stalls, MD;  Location: [facility];  Service: Ophthalmology;  Laterality: Left;  .  OOPHORECTOMY    . PARS PLANA VITRECTOMY Left 08/16/2014   Procedure: PARS PLANA VITRECTOMY WITH 25 GAUGE;  Surgeon: Sherlynn Stalls, MD;  Location: [facility];  Service: Ophthalmology;  Laterality: Left;  . TONSILLECTOMY    . WHIPPLE PROCEDURE       Social History   reports that she has been smoking cigarettes. She has a 23.50 pack-year smoking history. She has never used smokeless tobacco. She reports current alcohol use. She reports that she does not use drugs.   Family History   Her family history includes Cancer in her father, mother, and sister; Dementia in her sister; Diabetes in her brother; Heart disease in her father and sister. There is no history of Colon cancer.   Allergies No Known Allergies   Home Medications  Prior to Admission medications   Medication Sig Start Date End Date Taking? Authorizing Provider  acetaminophen  (TYLENOL) 500 MG tablet Take 500-1,000 mg by mouth every 6 (six) hours as needed for mild pain or headache.   Yes [provider]  Cholecalciferol (D3) 50 MCG (2000 UT) TABS Take 2,000 Units by mouth daily with breakfast.    Yes [provider]  metoprolol tartrate (LOPRESSOR) 25 MG tablet Take 0.5 tablets (12.5 mg total) by mouth 2 (two) times daily. 09/28/19 12/27/19 Yes Minus Breeding, MD  Multiple Vitamins-Minerals (MULTIVITAMINS THER. W/MINERALS) TABS Take 1 tablet by mouth daily.   Yes [provider]         Georgann Housekeeper, AGACNP-BC [place]  See [place] for personal pager PCCM on call pager [phone]  11/26/2019 1:26 PM

## 2019-11-26 NOTE — Consult Note (Addendum)
[place]  [phone] [phone]   MEDICAL ONCOLOGY - INITIAL CONSULTATION  Referral MD: Dr. 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  [name] [name] was found to recently have a new left lung lesion and was undergoing work-up as an outpatient for suspected malignancy.  She presented to [name] emergency room with shortness of breath.  [name] [name] had undergone thoracentesis as an outpatient on [name] day prior to admission and reported feeling much better once [name] fluid was drained.  However, she woke up [name] next morning feeling extremely short of breath and was unable to do basic things without becoming very winded.  On admission, WBC was 17.8 and platelets were 447,000.  CT of [name] chest showed no PE, redemonstration of [name] heterogeneous enhancing lesion centrally in [name] left lower lobe and extending into [name] medial aspect of [name] lingula/left upper lobe as well as directly invading [name] left hilum and extending along [name] mediastinal borders.  Findings consistent with primary lung neoplasm and is similar to slightly increased in size.  Additionally there was redemonstration of [name] previously FDG avid pleural nodularity with moderate persistent left malignant effusion and loculation, previously FDG avid enlarged AP window lymph nodes are similar to mildly increased in size when compared to prior PET scan, left lung lesion with hilar invasion results in some narrowing of [name] pulmonary arteries, occlusion of pulmonary veins and segmental occlusions of [name] central airways of [name] left lower lobe and lingula.  [name] [name] reports that she had been undergoing routine annual lung cancer  screening surveillance.  Last low-dose CT scan was performed on 03/06/2019 which did not show any findings concerning for malignancy.  She developed palpitations and worsening shortness of breath in July and saw her primary care provider who ordered a CT of [name] chest.  CT scan showed findings concerning for underlying malignancy.  This prompted a PET scan which was performed on 11/17/2019 which showed a large hypermetabolic central left lower lobe mass invading [name] left hilum and mediastinum and is consistent with primary lung neoplasm, extensive left-sided pleural metastatic disease, and malignant left pleural effusion, malignant upper abdominal lymphadenopathy but no hepatic or adrenal gland metastasis demonstrated.  No findings for metastatic pulmonary nodules on [name] right side and no findings suspicious for osseous metastatic disease.  She underwent ultrasound-guided thoracentesis as an outpatient on 11/23/2019 with 1.1 L of fluid removed.  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  She has been seen by pulmonology who recommends medical oncology consultation.  [name] [name]'s brother was at [name] bedside at [name] time of my visit today.  [name] [name] reports that her shortness of breath has improved since admission.  She reports that her appetite has always been low and that she has lost about 7 pounds over [name] past month.  She denies headaches but reports dizziness when initially standing.  She denies chest pain and hemoptysis but reports a nonproductive cough.  Denies abdominal pain, nausea, vomiting.  She reports pain to her left upper quadrant/left lower rib area  which radiates around to her back.  Denies lower extremity edema.  Has multiple neurofibromatosis over her  body.  [name] [name] is single.  She lives with her daughter.  [name] [name] has ongoing tobacco use and reports that she currently only smokes 4 cigarettes/day.  Previously, she smoked 2 packs of cigarettes per day.  Denies alcohol use.  Reports family history of her father who had brain metastases, mother who had metastatic cancer, maternal uncle with leukemia, sister with GYN malignancy, paternal aunt with ovarian cancer, and maternal grandmother with metastatic cancer.  Medical oncology was asked see [name] [name] to make recommendations regarding highly suspicious metastatic lung cancer.   Past Medical History:  Diagnosis Date  . Allergy   . 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 Carcinoid tumor of abdomen   . Cataract    both eyes  . Family history of adverse reaction to anesthesia    Mother had trouble waking up  . Hypertension   . Lower back pain   . Neurofibromatosis, type I (von Recklinghausen's disease) ([place])   . Pneumothorax on left   . Pneumothorax on right   :  Past Surgical History:  Procedure Laterality Date  . ABDOMINAL HYSTERECTOMY  04/23/1997   fibroids  TAH/ BSO  . CHOLECYSTECTOMY    . COLONOSCOPY  2006  . EYE SURGERY Left 11/2015   cataract and astigmatism surgery with Intraocular Lens left eye  . LASIK    . LUNG SURGERY  2008?   d/t pneumothroax  . MEMBRANE PEEL Left 08/16/2014   Procedure: MEMBRANE PEEL LEFT EYE ;  Surgeon: Sherlynn Stalls, MD;  Location: [facility];  Service: Ophthalmology;  Laterality: Left;  . OOPHORECTOMY    . PARS PLANA VITRECTOMY Left 08/16/2014   Procedure: PARS PLANA VITRECTOMY WITH 25 GAUGE;  Surgeon: Sherlynn Stalls, MD;  Location: [facility];  Service: Ophthalmology;  Laterality: Left;  . TONSILLECTOMY    . WHIPPLE PROCEDURE    :  Current Facility-Administered Medications  Medication Dose Route Frequency Provider Last Rate Last Admin  . azithromycin (ZITHROMAX) tablet 500 mg  500 mg Oral Daily Arrien, Jimmy Picket, MD   500 mg at 11/26/19 0946  .  cefTRIAXone (ROCEPHIN) 1 g in sodium chloride 0.9 % 100 mL IVPB  1 g Intravenous Q24H Tawni Millers, MD 200 mL/hr at 11/25/19 2305 1 g at 11/25/19 2305  . feeding supplement (ENSURE ENLIVE) (ENSURE ENLIVE) liquid 237 mL  237 mL Oral BID BM Arrien, Jimmy Picket, MD   237 mL at 11/26/19 0946  . HYDROcodone-acetaminophen (NORCO/VICODIN) 5-325 MG per tablet 1-2 tablet  1-2 tablet Oral Q4H PRN Clarnce Flock, MD   2 tablet at 11/26/19 1014  . ibuprofen (ADVIL) tablet 400 mg  400 mg Oral Q6H PRN Clarnce Flock, MD      . ipratropium-albuterol (DUONEB) 0.5-2.5 (3) MG/3ML nebulizer solution 3 mL  3 mL Nebulization Q2H PRN Arrien, Jimmy Picket, MD      . ipratropium-albuterol (DUONEB) 0.5-2.5 (3) MG/3ML nebulizer solution 3 mL  3 mL Nebulization QID Arrien, Jimmy Picket, MD   3 mL at 11/26/19 1205  . ketorolac (TORADOL) 15 MG/ML injection 15 mg  15 mg Intravenous Q6H PRN Clarnce Flock, MD   15 mg at 11/25/19 1958  . multivitamin with minerals tablet 1 tablet  1 tablet Oral Daily Arrien, Jimmy Picket, MD      . ondansetron [facility]) tablet 4 mg  4 mg Oral Q6H PRN Dione Plover,  Annice Needy, MD       Or  . ondansetron [facility]) injection 4 mg  4 mg Intravenous Q6H PRN Clarnce Flock, MD      . polyethylene glycol (MIRALAX / GLYCOLAX) packet 17 g  17 g Oral Daily PRN Clarnce Flock, MD      . traZODone (DESYREL) tablet 50 mg  50 mg Oral QHS PRN Clarnce Flock, MD   50 mg at 11/25/19 2043     No Known Allergies:  Family History  Problem Relation Age of Onset  . Cancer Mother   . Cancer Father   . Heart disease Father   . Cancer Sister   . Heart disease Sister   . Dementia Sister   . Diabetes Brother   . Colon cancer Neg Hx   :  Social History   Socioeconomic History  . Marital status: Single    Spouse name: Not on file  . Number of children: 1  . Years of education: Not on file  . Highest education level: Not on file  Occupational History  . Occupation:  retired    Comment: disabled  Tobacco Use  . Smoking status: Current Every Day Smoker    Packs/day: 0.50    Years: 47.00    Pack years: 23.50    Types: Cigarettes  . Smokeless tobacco: Never Used  . Tobacco comment: Down to 6 cigarettes daily, working on decreaseing even more  Substance and Sexual Activity  . Alcohol use: Yes    Alcohol/week: 0.0 standard drinks    Comment: RARE 1-2 drinks year  . Drug use: No  . Sexual activity: Never  Other Topics Concern  . Not on file  Social History Narrative   Single   Adopted daughter   2 Dogs      Exercise-no   Social Determinants of Health   Financial Resource Strain:   . Difficulty of Paying Living Expenses:   Food Insecurity:   . Worried About Charity fundraiser in [name] Last Year:   . Arboriculturist in [name] Last Year:   Transportation Needs:   . Film/video editor (Medical):   Marland Kitchen Lack of Transportation (Non-Medical):   Physical Activity:   . Days of Exercise per Week:   . Minutes of Exercise per Session:   Stress:   . Feeling of Stress :   Social Connections:   . Frequency of Communication with Friends and Family:   . Frequency of Social Gatherings with Friends and Family:   . Attends Religious Services:   . Active Member of Clubs or Organizations:   . Attends Archivist Meetings:   Marland Kitchen Marital Status:   Intimate Partner Violence:   . Fear of Current or Ex-Partner:   . Emotionally Abused:   Marland Kitchen Physically Abused:   . Sexually Abused:   :  Review of Systems: A comprehensive 14 point review of systems was negative except as noted in [name] HPI.  Exam: [name] Vitals for [name] past 24 hrs:  BP Temp Temp src Pulse Resp SpO2 Weight  11/26/19 0738 138/70 98.3 F (36.8 C) Oral 92 16 97 % -  11/25/19 2116 (!) 150/73 97.7 F (36.5 C) Oral 86 16 97 % 79.2 kg  11/25/19 2004 - - - - - 95 % -  11/25/19 1846 (!) 158/81 97.9 F (36.6 C) Oral 88 (!) 22 98 % -  11/25/19 1800 (!) 151/76 - - 80 19 99 % -  General:  Thin [name], no acute distress.   Eyes:  no scleral icterus.   ENT:  There were no oropharyngeal lesions.     Lymphatics:  Negative cervical, supraclavicular or axillary adenopathy.   Respiratory: Clear on [name] right, distant breath sounds on [name] left Cardiovascular:  Regular rate and rhythm, S1/S2, without murmur, rub or gallop.  There was no pedal edema.   GI:  abdomen was soft, flat, nontender, nondistended, without organomegaly.   Musculoskeletal:  no spinal tenderness of palpation of vertebral spine.   Skin: Multiple neurofibromatosis lesions noted on her back, arms, chest. Neuro exam was nonfocal. [name] was alert and oriented.  Attention was good.   Language was appropriate.  Mood was normal without depression.  Speech was not pressured.  Thought content was not tangential.     Lab Results  Component Value Date   WBC 16.2 (H) 11/25/2019   HGB 12.0 11/25/2019   HCT 38.2 11/25/2019   PLT 431 (H) 11/25/2019   GLUCOSE 97 11/25/2019   CHOL 176 09/24/2019   TRIG 120.0 09/24/2019   HDL 44.90 09/24/2019   LDLDIRECT 149.2 06/05/2013   LDLCALC 107 (H) 09/24/2019   ALT 16 11/25/2019   AST 19 11/25/2019   NA 135 11/25/2019   K 4.4 11/25/2019   CL 100 11/25/2019   CREATININE 0.67 11/25/2019   BUN 10 11/25/2019   CO2 25 11/25/2019    DG Chest 1 View  Result Date: 11/23/2019 CLINICAL DATA:  Post left thoracentesis EXAM: CHEST  1 VIEW COMPARISON:  Chest CT 11/12/2019 FINDINGS: No pneumothorax following left thoracentesis. Airspace disease in [name] left lower lobe. Underlying apical emphysematous changes and scarring. Heart is normal size. IMPRESSION: No pneumothorax following thoracentesis. Electronically Signed   By: Rolm Baptise M.D.   On: 11/23/2019 10:25   DG Thoracic Spine W/Swimmers  Result Date: 11/12/2019 CLINICAL DATA:  Left back pain for 2 weeks EXAM: LUMBAR SPINE - COMPLETE 4+ VIEW; THORACIC SPINE - 3 VIEWS COMPARISON:  03/06/2019 FINDINGS: Thoracic spine: Frontal and lateral  views of [name] thoracic spine demonstrate rotatory scoliosis of [name] thoracolumbar spine, with reversal of normal lordosis at [name] thoracolumbar junction. No acute or destructive bony lesions. There is mild lower thoracic spondylosis. Incidental note is made of dense left basilar consolidation and possible left effusion, dedicated chest imaging with two view chest x-ray or chest CT is recommended. There is extensive background emphysema and scarring. Lumbar spine: Frontal, bilateral oblique, lateral views of [name] lumbar spine demonstrate mild rotatory scoliosis, right convex at [name] thoracolumbar junction. There is extensive multilevel spondylosis greatest at [name] L1/L2 and L2/L3 levels. No acute fractures. Sacroiliac joints are normal. IMPRESSION: 1. Dense consolidation and possible effusion at [name] left lung base, superimposed upon severe background emphysema. Dedicated chest imaging with either two-view chest x-ray or chest CT is recommended. 2. Rotatory scoliosis of [name] thoracolumbar spine. 3. Prominent spondylosis at [name] thoracolumbar junction. 4. No acute bony abnormality. These results will be called to [name] ordering clinician or representative by [name] Radiologist Assistant, and communication documented in [name] PACS or [employer]. Electronically Signed   By: Randa Ngo M.D.   On: 11/12/2019 00:07   DG Lumbar Spine Complete  Result Date: 11/12/2019 CLINICAL DATA:  Left back pain for 2 weeks EXAM: LUMBAR SPINE - COMPLETE 4+ VIEW; THORACIC SPINE - 3 VIEWS COMPARISON:  03/06/2019 FINDINGS: Thoracic spine: Frontal and lateral views of [name] thoracic spine demonstrate rotatory scoliosis of [name] thoracolumbar spine, with reversal of normal lordosis  at [name] thoracolumbar junction. No acute or destructive bony lesions. There is mild lower thoracic spondylosis. Incidental note is made of dense left basilar consolidation and possible left effusion, dedicated chest imaging with two view chest x-ray or chest CT is  recommended. There is extensive background emphysema and scarring. Lumbar spine: Frontal, bilateral oblique, lateral views of [name] lumbar spine demonstrate mild rotatory scoliosis, right convex at [name] thoracolumbar junction. There is extensive multilevel spondylosis greatest at [name] L1/L2 and L2/L3 levels. No acute fractures. Sacroiliac joints are normal. IMPRESSION: 1. Dense consolidation and possible effusion at [name] left lung base, superimposed upon severe background emphysema. Dedicated chest imaging with either two-view chest x-ray or chest CT is recommended. 2. Rotatory scoliosis of [name] thoracolumbar spine. 3. Prominent spondylosis at [name] thoracolumbar junction. 4. No acute bony abnormality. These results will be called to [name] ordering clinician or representative by [name] Radiologist Assistant, and communication documented in [name] PACS or [employer]. Electronically Signed   By: Randa Ngo M.D.   On: 11/12/2019 00:07   CT Chest W Contrast  Result Date: 11/12/2019 CLINICAL DATA:  Abnormal finding on recent x-ray. EXAM: CT CHEST WITH CONTRAST TECHNIQUE: Multidetector CT imaging of [name] chest was performed during intravenous contrast administration. CONTRAST:  76m OMNIPAQUE IOHEXOL 300 MG/ML  SOLN COMPARISON:  March 06, 2019 FINDINGS: Cardiovascular: Mediastinum/Nodes: There is marked severity left hilar lymphadenopathy. Thyroid gland, trachea, and esophagus demonstrate no significant findings. Lungs/Pleura: There is marked severity emphysematous lung disease with stable calcified biapical cysts/bullae noted. Moderate to marked severity atelectasis and/or infiltrate is seen within [name] left lung base. A 3.1 cm x 4.0 cm heterogeneous low-attenuation mass is seen along [name] medial aspect of [name] left lower lobe (axial CT image 97, CT series number 2). An additional 2.1 cm x 1.6 cm low-attenuation soft tissue mass is seen within [name] posteromedial aspect of [name] left lower lobe (axial CT images 93 through 103,  CT series number 2). A 3.6 cm x 2.2 cm lobulated low-attenuation soft tissue mass is seen along [name] medial aspect of [name] left upper lobe, just above [name] level of [name] aortic arch. This represents a new finding when compared to [name] prior study. Adjacent 2.0 cm x 1.4 cm and 1.6 cm x 1.5 cm low-attenuation soft tissue masses are seen along [name] anteromedial aspect of [name] left upper lobe (axial CT image 69, CT series number 4). This also represents a new finding. Multiple lobulated pleural based soft tissue masses, of various sizes, are seen along [name] periphery of [name] left lung base. This represents a new finding when compared to [name] prior study. Upper Abdomen: There is a stable 2.7 cm x 2.0 cm low-attenuation left adrenal mass. An adjacent, inferior medial 1.6 cm x 1.7 cm lymph node is noted. This represents a new finding. Musculoskeletal: Multiple chronic anterior left-sided rib fractures are seen. Multilevel degenerative changes are seen throughout [name] thoracic spine IMPRESSION: 1. Heterogeneous soft tissue masses within [name] anteromedial and medial aspects of [name] left upper lobe and left lower lobe consistent with an underlying neoplastic process. 2. Moderate to marked severity atelectasis and/or infiltrate within [name] left lung base. 3. Multiple new low-attenuation soft tissue masses, of various sizes, along [name] periphery of [name] left lung base, consistent with metastatic disease. 4. Stable low-attenuation left adrenal mass with an adjacent, inferior medial 1.6 cm x 1.7 cm lymph node. 5. Multiple chronic anterior left-sided rib fractures. 6. Marked severity emphysematous lung disease with stable calcified biapical cysts/bullae. Emphysema (ICD10-J43.9). Electronically  Signed   By: Virgina Norfolk M.D.   On: 11/12/2019 16:20   CT Angio Chest PE W and/or Wo Contrast  Result Date: 11/24/2019 CLINICAL DATA:  Shortness of breath and tachycardia, thoracentesis yesterday EXAM: CT ANGIOGRAPHY CHEST WITH CONTRAST TECHNIQUE:  Multidetector CT imaging of [name] chest was performed using [name] standard protocol during bolus administration of intravenous contrast. Multiplanar CT image reconstructions and MIPs were obtained to evaluate [name] vascular anatomy. CONTRAST:  47m OMNIPAQUE IOHEXOL 350 MG/ML SOLN COMPARISON:  PET-CT 11/17/2019, radiograph 11/24/2019, CT chest 11/12/2019 FINDINGS: Cardiovascular: Satisfactory opacification [name] pulmonary arteries to [name] segmental level. No pulmonary artery filling defects are identified. Tapering of [name] pulmonary arteries as they enter [name] region of [name] left lower lobe mass and hilar invasion detailed below. Central pulmonary arteries are otherwise normal caliber. [name] aortic root is suboptimally assessed given cardiac pulsation artifact. Atherosclerotic plaque within [name] normal caliber aorta. No definite acute luminal abnormality within limitations of this non tailored evaluation. Atherosclerotic calcification at [name] origins of [name] great vessels including a shared origin of [name] left common carotid and brachiocephalic artery. Normal cardiac size. No visible pericardial effusion. For [name] discernible fat plane between [name] left ventricular free wall and [name] adjacent mediastinal mass seen on comparison CT and PET though this can be partially attributable to cardiac motion and volume averaging. Mediastinum/Nodes: There is extensive direct invasion of a left lower lobe mass extending throughout [name] soft tissues of [name] left hilum and mediastinum demonstrating hypermetabolism on [name] comparison PET-CT compatible with a primary lung neoplasm. Previously FDG avid enlarged AP window lymph nodes measuring up to 2.3 cm (6/71) are similar to mildly increased in size from comparison. No acute abnormality of [name] trachea. Loss of discernible fat plane between [name] mediastinal mass and [name] thoracic esophagus. No concerning thyroid nodules. Lungs/Pleura: Redemonstration [name] heterogeneous Lea enhancing lesion seen centrally in  [name] left lower lobe and extending into [name] medial aspect of [name] lingula/left upper lobe and invading [name] left hilum and extending along [name] mediastinal borders. Additional extensive FDG avid pleural nodularity is present as well. There is a persistent moderate size loculated malignant effusion. [name] central airways, pulmonary arteries appear mildly tapered with truncation of [name] pulmonary veins with narrowing and segmental occlusions of [name] central airways [name] lingula and left lower lobe. Additional consolidative opacity and ground-glass within [name] aerated portions of [name] left lower lobe and lingula likely reflect some postobstructive infection or inflammation. There are remote postsurgical changes in [name] bilateral lung apices with evidence of prior thoracotomy. Findings are on a background of diffuse moderate centrilobular and severe paraseptal emphysematous change. Chronic biapical areas of scarring are noted. Upper Abdomen: Pneumobilia noted in [name] liver. No other acute upper abdominal abnormality is evident. Musculoskeletal: Evidence of prior bilateral thoracotomy widening of [name] second interspaces bilaterally. No evidence of direct chest wall invasion at this time. Body wall edema is noted. No evidence of osseous metastatic disease. Review of [name] MIP images confirms [name] above findings. IMPRESSION: 1. No evidence of acute pulmonary artery filling defects. 2. Redemonstration of [name] heterogeneous enhancing lesion centrally in [name] left lower lobe and extending into [name] medial aspect of [name] lingula/left upper lobe as well as directly invading [name] left hilum and extending along [name] mediastinal borders. Findings compatible with primary lung neoplasm similar to slightly increased in size. 3. Redemonstration of [name] previously FDG avid pleural nodularity with a moderate persistent left malignant effusion with loculation. 4. Previously FDG avid enlarged AP window lymph nodes are  similar to mildly increased in size from  comparison PET-CT. 5. Left lung lesion with hilar invasion results in some narrowing of [name] pulmonary arteries, occlusion of [name] pulmonary veins and segmental occlusions of [name] central airways of [name] left lower lobe and lingula. Consolidative opacity and ground-glass within [name] aerated portions of [name] left lower lobe and lingula likely reflect some postobstructive infection or inflammation. 6. Evidence of prior bilateral thoracotomy widening of [name] second interspaces and postsurgical changes in [name] lung apices. 7. Pneumobilia in [name] liver, correlate for prior sphincterotomy. 8. Aortic Atherosclerosis (ICD10-I70.0) 9. Emphysema (ICD10-J43.9). Electronically Signed   By: Lovena Le M.D.   On: 11/24/2019 23:10   Cardiac event monitor  Result Date: 11/22/2019 Predominant rhythm is normal sinus Runs of NSVT with [name] longest being 15 beats. Runs of SVT with [name] longest being 9 beats.  Frequent supraventricular ectopy Infrequent ventricular ectopy.   NM PET Image Restage (PS) Skull Base to Thigh  Result Date: 11/17/2019 CLINICAL DATA:  Initial treatment strategy for left lung mass. EXAM: NUCLEAR MEDICINE PET SKULL BASE TO THIGH TECHNIQUE: 5.62 mCi F-18 FDG was injected intravenously. Full-ring PET imaging was performed from [name] skull base to thigh after [name] radiotracer. CT data was obtained and used for attenuation correction and anatomic localization. Fasting blood glucose: 99 mg/dl COMPARISON:  Chest CT 11/12/2019 FINDINGS: Mediastinal blood pool activity: SUV max 2.23 Liver activity: SUV max NA NECK: No hypermetabolic lymph nodes in [name] neck. Incidental CT findings: none CHEST: Large central left lower lobe mass invading [name] left hilum and mediastinum is markedly hypermetabolic with SUV max of 30.16. Findings consistent with a primary lung neoplasm. There is also extensive pleural metastatic disease involving [name] left hemithorax with numerous large hypermetabolic pleural nodules. SUV max in these lesions ranges  from 17.3 to 19.8. Associated malignant left pleural effusion. No breast masses, supraclavicular or axillary adenopathy. Severe underlying emphysematous changes. I do not see any definite metastatic pulmonary nodules in [name] right lung. Incidental CT findings: Age advanced vascular disease with aortic and coronary artery calcifications. ABDOMEN/PELVIS: Enlarged gastrohepatic ligament lymph node measures 19.5 mm on image 102/4. SUV max is 17.33. There is also a 19 mm left-sided retroperitoneal lymph node on image 109/4 which has an SUV max of 15.36. No findings for hepatic metastatic disease or adrenal gland metastatic disease. Left adrenal gland lesion measures 10 Hounsfield units and is not show hypermetabolism consistent with a benign adenoma. No metastatic disease involving [name] pelvis. Incidental CT findings: Aortic calcifications. Evidence of prior bowel surgery. Advanced vascular calcifications. SKELETON: No focal hypermetabolic activity to suggest skeletal metastasis. Incidental CT findings: none IMPRESSION: 1. Large hypermetabolic central left lower lobe mass invading [name] left hilum and mediastinum and consistent with primary lung neoplasm. There is also extensive left-sided pleural metastatic disease and a malignant left pleural effusion. 2. Malignant upper abdominal lymphadenopathy. No hepatic or adrenal gland metastasis are demonstrated. 3. No findings for metastatic pulmonary nodules in [name] right lung. 4. No findings suspicious for osseous metastatic disease. Electronically Signed   By: Marijo Sanes M.D.   On: 11/17/2019 15:19   DG Chest Port 1 View  Result Date: 11/24/2019 CLINICAL DATA:  Shortness of breath. EXAM: PORTABLE CHEST 1 VIEW COMPARISON:  November 23, 2019 FINDINGS: Marked severity atelectasis and/or infiltrate is seen within [name] left lung base. This is increased in severity when compared to [name] prior study. There is a small left pleural effusion which is also increased in size. No  pneumothorax is identified.  [name] heart size and mediastinal contours are within normal limits. A small deformity is seen along [name] lateral aspect of [name] upper left chest wall, with subsequent herniation of a small amount of left upper lobe. This is present on [name] prior study. There is mild dextroscoliosis of [name] midthoracic spine. IMPRESSION: 1. Marked severity left basilar atelectasis and/or infiltrate, increased in severity when compared to [name] prior study. 2. Small left pleural effusion which is increased in size. Electronically Signed   By: Virgina Norfolk M.D.   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 Request received for diagnostic and therapeutic left thoracentesis. EXAM: ULTRASOUND GUIDED DIAGNOSTIC AND THERAPEUTIC LEFT THORACENTESIS MEDICATIONS: 1% lidocaine to skin and subcutaneous tissue COMPLICATIONS: None immediate. PROCEDURE: An ultrasound guided thoracentesis was thoroughly discussed with [name] [name] and questions answered. [name] benefits, risks, alternatives and complications were also discussed. [name] [name] understands and wishes to proceed with [name] procedure. Written consent was obtained. Ultrasound was performed to localize and mark an adequate pocket of fluid in [name] left chest. [name] area was then prepped and draped in [name] normal sterile fashion. 1% Lidocaine was used for local anesthesia. Under ultrasound guidance a 6 Fr Safe-T-Centesis catheter was introduced. Thoracentesis was performed. [name] catheter was removed and a dressing applied. FINDINGS: A total of approximately 1.1 liters of slightly hazy, yellow fluid was removed. Samples were sent to [name] laboratory as requested by [name] clinical team. IMPRESSION: Successful ultrasound guided diagnostic and therapeutic left thoracentesis yielding  1.1 liters of pleural fluid. Read by: Rowe Robert, PA-C Electronically Signed   By: Jacqulynn Cadet M.D.   On: 11/23/2019 11:13     DG Chest 1 View  Result Date: 11/23/2019 CLINICAL DATA:  Post left thoracentesis EXAM: CHEST  1 VIEW COMPARISON:  Chest CT 11/12/2019 FINDINGS: No pneumothorax following left thoracentesis. Airspace disease in [name] left lower lobe. Underlying apical emphysematous changes and scarring. Heart is normal size. IMPRESSION: No pneumothorax following thoracentesis. Electronically Signed   By: Rolm Baptise M.D.   On: 11/23/2019 10:25   DG Thoracic Spine W/Swimmers  Result Date: 11/12/2019 CLINICAL DATA:  Left back pain for 2 weeks EXAM: LUMBAR SPINE - COMPLETE 4+ VIEW; THORACIC SPINE - 3 VIEWS COMPARISON:  03/06/2019 FINDINGS: Thoracic spine: Frontal and lateral views of [name] thoracic spine demonstrate rotatory scoliosis of [name] thoracolumbar spine, with reversal of normal lordosis at [name] thoracolumbar junction. No acute or destructive bony lesions. There is mild lower thoracic spondylosis. Incidental note is made of dense left basilar consolidation and possible left effusion, dedicated chest imaging with two view chest x-ray or chest CT is recommended. There is extensive background emphysema and scarring. Lumbar spine: Frontal, bilateral oblique, lateral views of [name] lumbar spine demonstrate mild rotatory scoliosis, right convex at [name] thoracolumbar junction. There is extensive multilevel spondylosis greatest at [name] L1/L2 and L2/L3 levels. No acute fractures. Sacroiliac joints are normal. IMPRESSION: 1. Dense consolidation and possible effusion at [name] left lung base, superimposed upon severe background emphysema. Dedicated chest imaging with either two-view chest x-ray or chest CT is recommended. 2. Rotatory scoliosis of [name] thoracolumbar spine. 3. Prominent spondylosis at [name] thoracolumbar junction. 4. No acute bony abnormality. These results will be called to [name] ordering clinician  or representative by [name] Radiologist Assistant, and communication documented in [name] PACS or [employer]. Electronically  Signed   By: Randa Ngo M.D.   On: 11/12/2019 00:07   DG Lumbar Spine Complete  Result Date: 11/12/2019 CLINICAL DATA:  Left back pain for 2 weeks EXAM: LUMBAR SPINE - COMPLETE 4+ VIEW; THORACIC SPINE - 3 VIEWS COMPARISON:  03/06/2019 FINDINGS: Thoracic spine: Frontal and lateral views of [name] thoracic spine demonstrate rotatory scoliosis of [name] thoracolumbar spine, with reversal of normal lordosis at [name] thoracolumbar junction. No acute or destructive bony lesions. There is mild lower thoracic spondylosis. Incidental note is made of dense left basilar consolidation and possible left effusion, dedicated chest imaging with two view chest x-ray or chest CT is recommended. There is extensive background emphysema and scarring. Lumbar spine: Frontal, bilateral oblique, lateral views of [name] lumbar spine demonstrate mild rotatory scoliosis, right convex at [name] thoracolumbar junction. There is extensive multilevel spondylosis greatest at [name] L1/L2 and L2/L3 levels. No acute fractures. Sacroiliac joints are normal. IMPRESSION: 1. Dense consolidation and possible effusion at [name] left lung base, superimposed upon severe background emphysema. Dedicated chest imaging with either two-view chest x-ray or chest CT is recommended. 2. Rotatory scoliosis of [name] thoracolumbar spine. 3. Prominent spondylosis at [name] thoracolumbar junction. 4. No acute bony abnormality. These results will be called to [name] ordering clinician or representative by [name] Radiologist Assistant, and communication documented in [name] PACS or [employer]. Electronically Signed   By: Randa Ngo M.D.   On: 11/12/2019 00:07   CT Chest W Contrast  Result Date: 11/12/2019 CLINICAL DATA:  Abnormal finding on recent x-ray. EXAM: CT CHEST WITH CONTRAST TECHNIQUE: Multidetector CT imaging of [name] chest was performed during intravenous  contrast administration. CONTRAST:  2m OMNIPAQUE IOHEXOL 300 MG/ML  SOLN COMPARISON:  March 06, 2019 FINDINGS: Cardiovascular: Mediastinum/Nodes: There is marked severity left hilar lymphadenopathy. Thyroid gland, trachea, and esophagus demonstrate no significant findings. Lungs/Pleura: There is marked severity emphysematous lung disease with stable calcified biapical cysts/bullae noted. Moderate to marked severity atelectasis and/or infiltrate is seen within [name] left lung base. A 3.1 cm x 4.0 cm heterogeneous low-attenuation mass is seen along [name] medial aspect of [name] left lower lobe (axial CT image 97, CT series number 2). An additional 2.1 cm x 1.6 cm low-attenuation soft tissue mass is seen within [name] posteromedial aspect of [name] left lower lobe (axial CT images 93 through 103, CT series number 2). A 3.6 cm x 2.2 cm lobulated low-attenuation soft tissue mass is seen along [name] medial aspect of [name] left upper lobe, just above [name] level of [name] aortic arch. This represents a new finding when compared to [name] prior study. Adjacent 2.0 cm x 1.4 cm and 1.6 cm x 1.5 cm low-attenuation soft tissue masses are seen along [name] anteromedial aspect of [name] left upper lobe (axial CT image 69, CT series number 4). This also represents a new finding. Multiple lobulated pleural based soft tissue masses, of various sizes, are seen along [name] periphery of [name] left lung base. This represents a new finding when compared to [name] prior study. Upper Abdomen: There is a stable 2.7 cm x 2.0 cm low-attenuation left adrenal mass. An adjacent, inferior medial 1.6 cm x 1.7 cm lymph node is noted. This represents a new finding. Musculoskeletal: Multiple chronic anterior left-sided rib fractures are seen. Multilevel degenerative changes are seen throughout [name] thoracic spine IMPRESSION: 1. Heterogeneous soft tissue masses within [name] anteromedial and medial aspects of [name] left upper lobe and left lower lobe consistent with an underlying  neoplastic process. 2. Moderate to marked  severity atelectasis and/or infiltrate within [name] left lung base. 3. Multiple new low-attenuation soft tissue masses, of various sizes, along [name] periphery of [name] left lung base, consistent with metastatic disease. 4. Stable low-attenuation left adrenal mass with an adjacent, inferior medial 1.6 cm x 1.7 cm lymph node. 5. Multiple chronic anterior left-sided rib fractures. 6. Marked severity emphysematous lung disease with stable calcified biapical cysts/bullae. Emphysema (ICD10-J43.9). Electronically Signed   By: Virgina Norfolk M.D.   On: 11/12/2019 16:20   CT Angio Chest PE W and/or Wo Contrast  Result Date: 11/24/2019 CLINICAL DATA:  Shortness of breath and tachycardia, thoracentesis yesterday EXAM: CT ANGIOGRAPHY CHEST WITH CONTRAST TECHNIQUE: Multidetector CT imaging of [name] chest was performed using [name] standard protocol during bolus administration of intravenous contrast. Multiplanar CT image reconstructions and MIPs were obtained to evaluate [name] vascular anatomy. CONTRAST:  46m OMNIPAQUE IOHEXOL 350 MG/ML SOLN COMPARISON:  PET-CT 11/17/2019, radiograph 11/24/2019, CT chest 11/12/2019 FINDINGS: Cardiovascular: Satisfactory opacification [name] pulmonary arteries to [name] segmental level. No pulmonary artery filling defects are identified. Tapering of [name] pulmonary arteries as they enter [name] region of [name] left lower lobe mass and hilar invasion detailed below. Central pulmonary arteries are otherwise normal caliber. [name] aortic root is suboptimally assessed given cardiac pulsation artifact. Atherosclerotic plaque within [name] normal caliber aorta. No definite acute luminal abnormality within limitations of this non tailored evaluation. Atherosclerotic calcification at [name] origins of [name] great vessels including a shared origin of [name] left common carotid and brachiocephalic artery. Normal cardiac size. No visible pericardial effusion. For [name] discernible fat plane between  [name] left ventricular free wall and [name] adjacent mediastinal mass seen on comparison CT and PET though this can be partially attributable to cardiac motion and volume averaging. Mediastinum/Nodes: There is extensive direct invasion of a left lower lobe mass extending throughout [name] soft tissues of [name] left hilum and mediastinum demonstrating hypermetabolism on [name] comparison PET-CT compatible with a primary lung neoplasm. Previously FDG avid enlarged AP window lymph nodes measuring up to 2.3 cm (6/71) are similar to mildly increased in size from comparison. No acute abnormality of [name] trachea. Loss of discernible fat plane between [name] mediastinal mass and [name] thoracic esophagus. No concerning thyroid nodules. Lungs/Pleura: Redemonstration [name] heterogeneous Lea enhancing lesion seen centrally in [name] left lower lobe and extending into [name] medial aspect of [name] lingula/left upper lobe and invading [name] left hilum and extending along [name] mediastinal borders. Additional extensive FDG avid pleural nodularity is present as well. There is a persistent moderate size loculated malignant effusion. [name] central airways, pulmonary arteries appear mildly tapered with truncation of [name] pulmonary veins with narrowing and segmental occlusions of [name] central airways [name] lingula and left lower lobe. Additional consolidative opacity and ground-glass within [name] aerated portions of [name] left lower lobe and lingula likely reflect some postobstructive infection or inflammation. There are remote postsurgical changes in [name] bilateral lung apices with evidence of prior thoracotomy. Findings are on a background of diffuse moderate centrilobular and severe paraseptal emphysematous change. Chronic biapical areas of scarring are noted. Upper Abdomen: Pneumobilia noted in [name] liver. No other acute upper abdominal abnormality is evident. Musculoskeletal: Evidence of prior bilateral thoracotomy widening of [name] second interspaces bilaterally. No evidence  of direct chest wall invasion at this time. Body wall edema is noted. No evidence of osseous metastatic disease. Review of [name] MIP images confirms [name] above findings. IMPRESSION: 1. No evidence of acute pulmonary artery filling defects. 2. Redemonstration of [name] heterogeneous enhancing lesion centrally  in [name] left lower lobe and extending into [name] medial aspect of [name] lingula/left upper lobe as well as directly invading [name] left hilum and extending along [name] mediastinal borders. Findings compatible with primary lung neoplasm similar to slightly increased in size. 3. Redemonstration of [name] previously FDG avid pleural nodularity with a moderate persistent left malignant effusion with loculation. 4. Previously FDG avid enlarged AP window lymph nodes are similar to mildly increased in size from comparison PET-CT. 5. Left lung lesion with hilar invasion results in some narrowing of [name] pulmonary arteries, occlusion of [name] pulmonary veins and segmental occlusions of [name] central airways of [name] left lower lobe and lingula. Consolidative opacity and ground-glass within [name] aerated portions of [name] left lower lobe and lingula likely reflect some postobstructive infection or inflammation. 6. Evidence of prior bilateral thoracotomy widening of [name] second interspaces and postsurgical changes in [name] lung apices. 7. Pneumobilia in [name] liver, correlate for prior sphincterotomy. 8. Aortic Atherosclerosis (ICD10-I70.0) 9. Emphysema (ICD10-J43.9). Electronically Signed   By: Lovena Le M.D.   On: 11/24/2019 23:10   Cardiac event monitor  Result Date: 11/22/2019 Predominant rhythm is normal sinus Runs of NSVT with [name] longest being 15 beats. Runs of SVT with [name] longest being 9 beats.  Frequent supraventricular ectopy Infrequent ventricular ectopy.   NM PET Image Restage (PS) Skull Base to Thigh  Result Date: 11/17/2019 CLINICAL DATA:  Initial treatment strategy for left lung mass. EXAM: NUCLEAR MEDICINE PET SKULL BASE TO THIGH  TECHNIQUE: 5.62 mCi F-18 FDG was injected intravenously. Full-ring PET imaging was performed from [name] skull base to thigh after [name] radiotracer. CT data was obtained and used for attenuation correction and anatomic localization. Fasting blood glucose: 99 mg/dl COMPARISON:  Chest CT 11/12/2019 FINDINGS: Mediastinal blood pool activity: SUV max 2.23 Liver activity: SUV max NA NECK: No hypermetabolic lymph nodes in [name] neck. Incidental CT findings: none CHEST: Large central left lower lobe mass invading [name] left hilum and mediastinum is markedly hypermetabolic with SUV max of 15.72. Findings consistent with a primary lung neoplasm. There is also extensive pleural metastatic disease involving [name] left hemithorax with numerous large hypermetabolic pleural nodules. SUV max in these lesions ranges from 17.3 to 19.8. Associated malignant left pleural effusion. No breast masses, supraclavicular or axillary adenopathy. Severe underlying emphysematous changes. I do not see any definite metastatic pulmonary nodules in [name] right lung. Incidental CT findings: Age advanced vascular disease with aortic and coronary artery calcifications. ABDOMEN/PELVIS: Enlarged gastrohepatic ligament lymph node measures 19.5 mm on image 102/4. SUV max is 17.33. There is also a 19 mm left-sided retroperitoneal lymph node on image 109/4 which has an SUV max of 15.36. No findings for hepatic metastatic disease or adrenal gland metastatic disease. Left adrenal gland lesion measures 10 Hounsfield units and is not show hypermetabolism consistent with a benign adenoma. No metastatic disease involving [name] pelvis. Incidental CT findings: Aortic calcifications. Evidence of prior bowel surgery. Advanced vascular calcifications. SKELETON: No focal hypermetabolic activity to suggest skeletal metastasis. Incidental CT findings: none IMPRESSION: 1. Large hypermetabolic central left lower lobe mass invading [name] left hilum and mediastinum and consistent with  primary lung neoplasm. There is also extensive left-sided pleural metastatic disease and a malignant left pleural effusion. 2. Malignant upper abdominal lymphadenopathy. No hepatic or adrenal gland metastasis are demonstrated. 3. No findings for metastatic pulmonary nodules in [name] right lung. 4. No findings suspicious for osseous metastatic disease. Electronically Signed   By: Marijo Sanes M.D.   On: 11/17/2019  15:19   DG Chest Port 1 View  Result Date: 11/24/2019 CLINICAL DATA:  Shortness of breath. EXAM: PORTABLE CHEST 1 VIEW COMPARISON:  November 23, 2019 FINDINGS: Marked severity atelectasis and/or infiltrate is seen within [name] left lung base. This is increased in severity when compared to [name] prior study. There is a small left pleural effusion which is also increased in size. No pneumothorax is identified. [name] heart size and mediastinal contours are within normal limits. A small deformity is seen along [name] lateral aspect of [name] upper left chest wall, with subsequent herniation of a small amount of left upper lobe. This is present on [name] prior study. There is mild dextroscoliosis of [name] midthoracic spine. IMPRESSION: 1. Marked severity left basilar atelectasis and/or infiltrate, increased in severity when compared to [name] prior study. 2. Small left pleural effusion which is increased in size. Electronically Signed   By: Virgina Norfolk M.D.   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 Request received for diagnostic and therapeutic left thoracentesis. EXAM: ULTRASOUND GUIDED DIAGNOSTIC AND THERAPEUTIC LEFT THORACENTESIS MEDICATIONS: 1% lidocaine to skin and subcutaneous tissue COMPLICATIONS: None immediate. PROCEDURE: An ultrasound guided thoracentesis was thoroughly discussed with [name] [name] and  questions answered. [name] benefits, risks, alternatives and complications were also discussed. [name] [name] understands and wishes to proceed with [name] procedure. Written consent was obtained. Ultrasound was performed to localize and mark an adequate pocket of fluid in [name] left chest. [name] area was then prepped and draped in [name] normal sterile fashion. 1% Lidocaine was used for local anesthesia. Under ultrasound guidance a 6 Fr Safe-T-Centesis catheter was introduced. Thoracentesis was performed. [name] catheter was removed and a dressing applied. FINDINGS: A total of approximately 1.1 liters of slightly hazy, yellow fluid was removed. Samples were sent to [name] laboratory as requested by [name] clinical team. IMPRESSION: Successful ultrasound guided diagnostic and therapeutic left thoracentesis yielding 1.1 liters of pleural fluid. Read by: Rowe Robert, PA-C Electronically Signed   By: Jacqulynn Cadet M.D.   On: 11/23/2019 11:13    Assessment and Plan:  This is a very pleasant 64 year old Caucasian [name] with highly suspicious metastatic lung cancer.  She presented with a left upper lobe lung mass and malignant pleural effusion.  She underwent thoracentesis on 11/23/2019 and cytology is currently pending.  Discussed scan findings with [name] [name] and her brother.  We also discussed preliminary report per Dr. Agustina Caroli note which indicated that there are malignant cells found in [name] pleural fluid.  Pathology indicates initial evaluation concerning for neuroendocrine features suggestive of small cell lung cancer, but final immunohistochemical staining is pending.  Will await final pathology to determine definitive treatment plan. [name] [name] will need an MRI of [name] brain with and without contrast to complete her staging work-up.    Thank you for this referral.   Mikey Bussing, DNP, AGPCNP-BC, AOCNP  ADDENDUM: Hematology/Oncology Attending: I had a face-to-face encounter with [name] [name] today I recommended her care  plan and I agree with [name] above note.  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  [name] [name] was found on outpatient work for evaluation of her COPD and shortness of breath to have left pleural effusion.  She had CT scan of [name] chest on 11/12/2019 that showed heterogeneous soft tissue masses within [name] anterior medial and medial aspect of [name] left upper lobe and left lower lobe consistent with an underlying neoplastic process.  There was moderate to marked severity atelectasis and/or infiltrate within [name] left lung base and multiple new low-attenuation soft tissue masses of various sizes along [name] periphery of [name] left lung base consistent with metastatic disease.  There was also stable low-attenuation left adrenal mass with an adjacent inferior medial 1.6 x 1.7 cm lymph node and multiple chronic anterior left sided rib fractures.  [name] [name] had a PET scan on 11/17/2019 and it showed large hypermetabolic central left lower lobe mass invading [name] left hilum and mediastinum consistent with primary lung neoplasm.  There was also extensive left-sided pleural metastatic disease and a malignant left pleural effusion.  There was malignant upper abdominal lymphadenopathy but no hepatic or adrenal gland metastasis were demonstrated.  No findings of metastatic pulmonary nodules in [name] right lung and no findings suspicious for osseous metastatic disease.  On 11/23/2019 [name] [name] had successful ultrasound-guided diagnostic and therapeutic left thoracentesis with drainage of 1.1 L of pleural fluid.  [name] final cytology ([id number]) showed malignant cells. [name] cellblock material has ample cells with apoptotic debris and mitotic activity. By immunohistochemistry, these neoplastic cells are positive for cytokeratin AE1/3 (dot-like), CD56, chromogranin (weak), synaptophysin  (weak) but negative for TTF-1. [name] proliferative rate by Ki-67 is elevated. 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  After this thoracentesis [name] [name] complaint of worsening shortness of breath and tachycardia and she was admitted to [facility] for evaluation.  CT angiogram of [name] chest on 11/24/2019 showed no evidence of acute pulmonary embolism and demonstrated [name] previous abnormality seen on [name] chest on [name] previous CT scan as well as a PET scan. I was consulted to see [name] [name] today for evaluation and recommendation regarding treatment of her condition. I had a lengthy discussion with [name] [name] and her brother who was at [name] bedside today. I explained to [name] [name] that she has extensive stage small cell lung cancer and all [name] treatment will be of palliative nature.  She understand that this is incurable condition. I recommended for [name] [name] to complete [name] staging work-up by ordering MRI of [name] brain to rule out brain metastasis. [name] [name] is interested in treatment and she will be considered for systemic chemotherapy with carboplatin for AUC of 5 on day 1, etoposide 100 mg/M2 on days 1, 2 and 3 in addition to Imfinzi 1500 mg IV every 3 weeks with chemotherapy followed by maintenance treatment every 4 weeks if she has no evidence for disease progression.  I will also add treatment with Cosela for Myeloprotection with her chemotherapy. I will arrange for [name] [name] to have a Port-A-Cath placed before starting her treatment next week. I will arrange for [name] [name] to receive [name] first dose of her treatment next week on outpatient basis because of [name] urgency of her condition. I discussed with [name] [name] and her brother [name] adverse effect of this treatment including but not limited to alopecia, myelosuppression, nausea and vomiting, peripheral neuropathy, liver or renal dysfunction as well as immunotherapy adverse effects. [name]  [name] will come back for follow-up visit with [name] first day of her treatment for evaluation. I will arrange for [name] [name] to have a chemotherapy education class before [name] first dose of her treatment and I will send  prescription of Compazine to her pharmacy. She was advised to call immediately if she has any concerning symptoms in [name] interval. Thank you so much for allowing me to participate in [name] care of Ms. Brummell.  Please call if you have any questions.  Disclaimer: This note was dictated with voice recognition software. Similar sounding words can inadvertently be transcribed and may be missed upon review. Eilleen Kempf, MD

## 2019-11-26 NOTE — Telephone Encounter (Signed)
-----   Message from Minus Breeding, MD sent at 11/22/2019 12:27 PM EDT ----- Regarding: Monitor This patient has a monitor with NSVT and SVT.  I read it but results did not come into my basket to send a result note.  Please call her with results.   I should see her sometime this month to review all of her recent studies.  Thanks.

## 2019-11-27 ENCOUNTER — Other Ambulatory Visit: Payer: Self-pay | Admitting: Oncology

## 2019-11-27 ENCOUNTER — Encounter: Payer: Self-pay | Admitting: Internal Medicine

## 2019-11-27 ENCOUNTER — Telehealth: Payer: Self-pay | Admitting: Internal Medicine

## 2019-11-27 ENCOUNTER — Inpatient Hospital Stay (HOSPITAL_COMMUNITY): Payer: PPO

## 2019-11-27 ENCOUNTER — Telehealth: Payer: Self-pay | Admitting: Emergency Medicine

## 2019-11-27 ENCOUNTER — Other Ambulatory Visit: Payer: Self-pay | Admitting: Internal Medicine

## 2019-11-27 DIAGNOSIS — C3492 Malignant neoplasm of unspecified part of left bronchus or lung: Secondary | ICD-10-CM

## 2019-11-27 DIAGNOSIS — J9601 Acute respiratory failure with hypoxia: Secondary | ICD-10-CM

## 2019-11-27 DIAGNOSIS — F17210 Nicotine dependence, cigarettes, uncomplicated: Secondary | ICD-10-CM

## 2019-11-27 DIAGNOSIS — C3490 Malignant neoplasm of unspecified part of unspecified bronchus or lung: Secondary | ICD-10-CM

## 2019-11-27 DIAGNOSIS — J439 Emphysema, unspecified: Secondary | ICD-10-CM

## 2019-11-27 DIAGNOSIS — Z79899 Other long term (current) drug therapy: Secondary | ICD-10-CM

## 2019-11-27 DIAGNOSIS — I10 Essential (primary) hypertension: Secondary | ICD-10-CM

## 2019-11-27 DIAGNOSIS — C3412 Malignant neoplasm of upper lobe, left bronchus or lung: Secondary | ICD-10-CM

## 2019-11-27 DIAGNOSIS — C782 Secondary malignant neoplasm of pleura: Secondary | ICD-10-CM

## 2019-11-27 LAB — BASIC METABOLIC PANEL
Anion gap: 11 (ref 5–15)
BUN: 9 mg/dL (ref 8–23)
CO2: 23 mmol/L (ref 22–32)
Calcium: 8.4 mg/dL — ABNORMAL LOW (ref 8.9–10.3)
Chloride: 104 mmol/L (ref 98–111)
Creatinine, Ser: 0.54 mg/dL (ref 0.44–1.00)
GFR calc Af Amer: >60 mL/min (ref >60)
GFR calc non Af Amer: >60 mL/min (ref >60)
Glucose, Bld: 106 mg/dL — ABNORMAL HIGH (ref 70–99)
Potassium: 4.3 mmol/L (ref 3.5–5.1)
Sodium: 138 mmol/L (ref 135–145)

## 2019-11-27 LAB — CBC WITH DIFFERENTIAL/PLATELET
Abs Immature Granulocytes: 0.07 10*3/uL (ref 0.00–0.07)
Basophils Absolute: 0.1 10*3/uL (ref 0.0–0.1)
Basophils Relative: 0 %
Eosinophils Absolute: 0.2 10*3/uL (ref 0.0–0.5)
Eosinophils Relative: 1 %
HCT: 33.6 % — ABNORMAL LOW (ref 36.0–46.0)
Hemoglobin: 11 g/dL — ABNORMAL LOW (ref 12.0–15.0)
Immature Granulocytes: 1 %
Lymphocytes Relative: 16 %
Lymphs Abs: 2.5 10*3/uL (ref 0.7–4.0)
MCH: 28.1 pg (ref 26.0–34.0)
MCHC: 32.7 g/dL (ref 30.0–36.0)
MCV: 85.7 fL (ref 80.0–100.0)
Monocytes Absolute: 1.9 10*3/uL — ABNORMAL HIGH (ref 0.1–1.0)
Monocytes Relative: 13 %
Neutro Abs: 10.6 10*3/uL — ABNORMAL HIGH (ref 1.7–7.7)
Neutrophils Relative %: 69 %
Platelets: 411 10*3/uL — ABNORMAL HIGH (ref 150–400)
RBC: 3.92 MIL/uL (ref 3.87–5.11)
RDW: 14.2 % (ref 11.5–15.5)
WBC: 15.3 10*3/uL — ABNORMAL HIGH (ref 4.0–10.5)
nRBC: 0 % (ref 0.0–0.2)

## 2019-11-27 LAB — CYTOLOGY - NON PAP

## 2019-11-27 MED ORDER — HYDROCODONE-ACETAMINOPHEN 5-325 MG PO TABS: 1.0000 | ORAL_TABLET | Freq: Four times a day (QID) | ORAL | 0 refills | Status: DC | PRN

## 2019-11-27 MED ORDER — TRAZODONE HCL 50 MG PO TABS: 50.0000 mg | ORAL_TABLET | Freq: Every evening | ORAL | 1 refills | Status: DC | PRN

## 2019-11-27 MED ORDER — LIDOCAINE-PRILOCAINE 2.5-2.5 % EX CREA
TOPICAL_CREAM | CUTANEOUS | 0 refills | Status: DC
Start: 2019-11-27 — End: 2020-03-09

## 2019-11-27 MED ORDER — CEFDINIR 300 MG PO CAPS: 300.0000 mg | ORAL_CAPSULE | Freq: Two times a day (BID) | ORAL | 0 refills | Status: DC

## 2019-11-27 MED ORDER — CEFDINIR 300 MG PO CAPS
300.0000 mg | ORAL_CAPSULE | Freq: Two times a day (BID) | ORAL | Status: DC
  Administered 2019-11-27: 300 mg via ORAL
  Filled 2019-11-27 (×2): qty 1

## 2019-11-27 MED ORDER — PROCHLORPERAZINE MALEATE 10 MG PO TABS
10.0000 mg | ORAL_TABLET | Freq: Four times a day (QID) | ORAL | 0 refills | Status: DC | PRN
Start: 2019-11-27 — End: 2019-12-31

## 2019-11-27 MED ORDER — AZITHROMYCIN 250 MG PO TABS: 250.0000 mg | ORAL_TABLET | Freq: Every day | ORAL | 0 refills | Status: DC

## 2019-11-27 MED ORDER — GADOBUTROL 1 MMOL/ML IV SOLN
7.5000 mL | Freq: Once | INTRAVENOUS | Status: AC | PRN
  Administered 2019-11-27: 7.5 mL via INTRAVENOUS

## 2019-11-27 NOTE — Telephone Encounter (Signed)
Scheduled appt per 8/6 sch msg - unable to reach pt . Left message with appt date and time on vmail

## 2019-11-27 NOTE — Telephone Encounter (Signed)
Please set this pt up with a Hosp F/U OV with Loreena Valeri. Thanks.

## 2019-11-27 NOTE — Progress Notes (Signed)
Oncology placed a request for Interventional Radiology to place a port-a-cath for this patient, either inpatient or outpatient. Patient is scheduled to be discharged today. Oncology notified that the procedure will need to be scheduled as an outpatient and that a new order needs to be placed.   Please call Interventional Radiology with any questions.  Soyla Dryer, [place] [phone] 11/27/2019, 11:08 AM

## 2019-11-27 NOTE — Telephone Encounter (Signed)
Dr. Lamonte Sakai, your first available 52min slot is not until 01/11/2020. Is this too far out?

## 2019-11-27 NOTE — Discharge Summary (Signed)
Physician Discharge Summary  [name] [name]:[id number] DOB: 1955/11/21 DOA: 11/24/2019  PCP: [name], DO  Admit date: 11/24/2019 Discharge date: 11/27/2019  Admitted From: Home  Disposition:  Home with O2   Recommendations for Outpatient Follow-up:  1. Follow up with PCP in 1-2 weeks 2. Follow up with Pulmonology as directed 3. Follow up with Oncology on Wednesday next week to begin chemo 4. Dr. Julien Nordmann: Please follow up MRI brain results      Home Health: None  Equipment/Devices: O2  Discharge Condition: Fair  CODE STATUS: FULL Diet recommendation: Regular  Brief/Interim Summary: [name] [name] is a 64 y.o. 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  Patient was recently noted to have lung mass with pleural effusion discovered as a result of respiratory symptoms and dyspnea.  She underwent thoracentesis and had some initial improvement in symptoms, but then became increasingly short of breath.  In the ER found to be tachypneic and hypoxic.  She was admitted and started on empiric antibiotics for suspected postobstructive pneumonia, and new lung mass.       PRINCIPAL HOSPITAL DIAGNOSIS: Acute hypoxic respiratory failure due to lung mass, chronic COPD    Discharge Diagnoses:    Acute hypoxic respiratory failure due to lung mass, chronic COPD Possible postobstructive pneumonia Pleural effusion The patient was admitted and started on empiric antibiotics.  Pulmonology and oncology were consulted.  Results of her thoracentesis showed malignant cells, consistent with small cell carcinoma of the lung.  Oncology recommended MRI brain which was performed and results are pending.  Patient now mentating at baseline, taking orals.  Temp < 100 F, heart rate < 100bpm, RR < 24.  Discharged to complete 7 days antibiotics.  Has close follow up with Oncology  for expedited initiation of chemotherapy.     Smoking Cessation strongly recommended, Wellbutrin and Chantix discussed, nicotine replacement discussed, patient has ample resources and prefers to handle this on her own.  Neurofibromatosis            Discharge Instructions  Discharge Instructions    Discharge instructions   Complete by: As directed    You were admitted for trouble breathing.   We found that this was from a rapid small cell lung cancer, with a post-osbtructive pneumonia, probably.  Take cefdinir 300 mg twice daily for the next 3-4 days (starting tonight) Take azithromycin 250 mg once daily for the next two days (Sat and Sun)  Go see Dr. Julien Nordmann early next week, as directed  Take trazodone 50 mg as needed in the evening for sleep  Take hydrocodone 5 mg as needed for pain, this is an opiate, keep secured and do not take before driving or operating machinery.   Increase activity slowly   Complete by: As directed      Allergies as of 11/27/2019   No Known Allergies     Medication List    TAKE these medications   acetaminophen 500 MG tablet Commonly known as: TYLENOL Take 500-1,000 mg by mouth every 6 (six) hours as needed for mild pain or headache.   azithromycin 250 MG tablet Commonly known as: ZITHROMAX Take 1 tablet (250 mg total) by mouth daily.   cefdinir 300 MG capsule Commonly known as: OMNICEF Take 1 capsule (300 mg total) by mouth every 12 (twelve) hours.   D3 50 MCG (2000 UT) Tabs Generic drug: Cholecalciferol Take 2,000 Units  by mouth daily with breakfast.   HYDROcodone-acetaminophen 5-325 MG tablet Commonly known as: NORCO/VICODIN Take 1-2 tablets by mouth every 6 (six) hours as needed for up to 5 days. What changed: when to take this   lidocaine-prilocaine cream Commonly known as: EMLA Apply to the Port-A-Cath site 30-60 minutes before treatment.   metoprolol tartrate 25 MG tablet Commonly known as: LOPRESSOR Take 0.5  tablets (12.5 mg total) by mouth 2 (two) times daily.   multivitamins ther. w/minerals Tabs tablet Take 1 tablet by mouth daily.   prochlorperazine 10 MG tablet Commonly known as: COMPAZINE Take 1 tablet (10 mg total) by mouth every 6 (six) hours as needed for nausea or vomiting.   traZODone 50 MG tablet Commonly known as: DESYREL Take 1 tablet (50 mg total) by mouth at bedtime as needed for sleep.            Durable Medical Equipment  (From admission, onward)         Start     Ordered   11/27/19 1000  DME Oxygen  (Discharge Planning)  Once       Question Answer Comment  Length of Need Lifetime   Mode or (Route) Nasal cannula   Liters per Minute 2   Frequency Continuous (stationary and portable oxygen unit needed)   Oxygen conserving device Yes   Oxygen delivery system Gas      11/27/19 0959          Follow-up Information    [facility] Follow up.   Why: Oxygen to be delivered to your house and portable tank delivered to hospital room prior to discahrge.  Contact information: [address] [place] [address] [phone]        [name], DO. Schedule an appointment as soon as possible for a visit in 1 week(s).   Specialty: Family Medicine Contact information: [address] [place] [address] [phone]        Curt Bears, MD. Schedule an appointment as soon as possible for a visit in 1 week(s).   Specialty: Oncology Contact information: [place] [address] [phone]              No Known Allergies  Consultations:  Oncology  Pulmonology   Procedures/Studies: DG Chest 1 View  Result Date: 11/23/2019 CLINICAL DATA:  Post left thoracentesis EXAM: CHEST  1 VIEW COMPARISON:  Chest CT 11/12/2019 FINDINGS: No pneumothorax following left thoracentesis. Airspace disease in the left lower lobe. Underlying apical emphysematous changes and scarring. Heart is normal  size. IMPRESSION: No pneumothorax following thoracentesis. Electronically Signed   By: Rolm Baptise M.D.   On: 11/23/2019 10:25   DG Thoracic Spine W/Swimmers  Result Date: 11/12/2019 CLINICAL DATA:  Left back pain for 2 weeks EXAM: LUMBAR SPINE - COMPLETE 4+ VIEW; THORACIC SPINE - 3 VIEWS COMPARISON:  03/06/2019 FINDINGS: Thoracic spine: Frontal and lateral views of the thoracic spine demonstrate rotatory scoliosis of the thoracolumbar spine, with reversal of normal lordosis at the thoracolumbar junction. No acute or destructive bony lesions. There is mild lower thoracic spondylosis. Incidental note is made of dense left basilar consolidation and possible left effusion, dedicated chest imaging with two view chest x-ray or chest CT is recommended. There is extensive background emphysema and scarring. Lumbar spine: Frontal, bilateral oblique, lateral views of the lumbar spine demonstrate mild rotatory scoliosis, right convex at the thoracolumbar junction. There is extensive multilevel spondylosis greatest at the  L1/L2 and L2/L3 levels. No acute fractures. Sacroiliac joints are normal. IMPRESSION: 1. Dense consolidation and possible effusion at the left lung base, superimposed upon severe background emphysema. Dedicated chest imaging with either two-view chest x-ray or chest CT is recommended. 2. Rotatory scoliosis of the thoracolumbar spine. 3. Prominent spondylosis at the thoracolumbar junction. 4. No acute bony abnormality. These results will be called to the ordering clinician or representative by the Radiologist Assistant, and communication documented in the PACS or [employer]. Electronically Signed   By: Randa Ngo M.D.   On: 11/12/2019 00:07   DG Lumbar Spine Complete  Result Date: 11/12/2019 CLINICAL DATA:  Left back pain for 2 weeks EXAM: LUMBAR SPINE - COMPLETE 4+ VIEW; THORACIC SPINE - 3 VIEWS COMPARISON:  03/06/2019 FINDINGS: Thoracic spine: Frontal and lateral views of the thoracic spine  demonstrate rotatory scoliosis of the thoracolumbar spine, with reversal of normal lordosis at the thoracolumbar junction. No acute or destructive bony lesions. There is mild lower thoracic spondylosis. Incidental note is made of dense left basilar consolidation and possible left effusion, dedicated chest imaging with two view chest x-ray or chest CT is recommended. There is extensive background emphysema and scarring. Lumbar spine: Frontal, bilateral oblique, lateral views of the lumbar spine demonstrate mild rotatory scoliosis, right convex at the thoracolumbar junction. There is extensive multilevel spondylosis greatest at the L1/L2 and L2/L3 levels. No acute fractures. Sacroiliac joints are normal. IMPRESSION: 1. Dense consolidation and possible effusion at the left lung base, superimposed upon severe background emphysema. Dedicated chest imaging with either two-view chest x-ray or chest CT is recommended. 2. Rotatory scoliosis of the thoracolumbar spine. 3. Prominent spondylosis at the thoracolumbar junction. 4. No acute bony abnormality. These results will be called to the ordering clinician or representative by the Radiologist Assistant, and communication documented in the PACS or [employer]. Electronically Signed   By: Randa Ngo M.D.   On: 11/12/2019 00:07   CT Chest W Contrast  Result Date: 11/12/2019 CLINICAL DATA:  Abnormal finding on recent x-ray. EXAM: CT CHEST WITH CONTRAST TECHNIQUE: Multidetector CT imaging of the chest was performed during intravenous contrast administration. CONTRAST:  29mL OMNIPAQUE IOHEXOL 300 MG/ML  SOLN COMPARISON:  March 06, 2019 FINDINGS: Cardiovascular: Mediastinum/Nodes: There is marked severity left hilar lymphadenopathy. Thyroid gland, trachea, and esophagus demonstrate no significant findings. Lungs/Pleura: There is marked severity emphysematous lung disease with stable calcified biapical cysts/bullae noted. Moderate to marked severity atelectasis  and/or infiltrate is seen within the left lung base. A 3.1 cm x 4.0 cm heterogeneous low-attenuation mass is seen along the medial aspect of the left lower lobe (axial CT image 97, CT series number 2). An additional 2.1 cm x 1.6 cm low-attenuation soft tissue mass is seen within the posteromedial aspect of the left lower lobe (axial CT images 93 through 103, CT series number 2). A 3.6 cm x 2.2 cm lobulated low-attenuation soft tissue mass is seen along the medial aspect of the left upper lobe, just above the level of the aortic arch. This represents a new finding when compared to the prior study. Adjacent 2.0 cm x 1.4 cm and 1.6 cm x 1.5 cm low-attenuation soft tissue masses are seen along the anteromedial aspect of the left upper lobe (axial CT image 69, CT series number 4). This also represents a new finding. Multiple lobulated pleural based soft tissue masses, of various sizes, are seen along the periphery of the left lung base. This represents a new finding when compared to  the prior study. Upper Abdomen: There is a stable 2.7 cm x 2.0 cm low-attenuation left adrenal mass. An adjacent, inferior medial 1.6 cm x 1.7 cm lymph node is noted. This represents a new finding. Musculoskeletal: Multiple chronic anterior left-sided rib fractures are seen. Multilevel degenerative changes are seen throughout the thoracic spine IMPRESSION: 1. Heterogeneous soft tissue masses within the anteromedial and medial aspects of the left upper lobe and left lower lobe consistent with an underlying neoplastic process. 2. Moderate to marked severity atelectasis and/or infiltrate within the left lung base. 3. Multiple new low-attenuation soft tissue masses, of various sizes, along the periphery of the left lung base, consistent with metastatic disease. 4. Stable low-attenuation left adrenal mass with an adjacent, inferior medial 1.6 cm x 1.7 cm lymph node. 5. Multiple chronic anterior left-sided rib fractures. 6. Marked severity  emphysematous lung disease with stable calcified biapical cysts/bullae. Emphysema (ICD10-J43.9). Electronically Signed   By: Virgina Norfolk M.D.   On: 11/12/2019 16:20   CT Angio Chest PE W and/or Wo Contrast  Result Date: 11/24/2019 CLINICAL DATA:  Shortness of breath and tachycardia, thoracentesis yesterday EXAM: CT ANGIOGRAPHY CHEST WITH CONTRAST TECHNIQUE: Multidetector CT imaging of the chest was performed using the standard protocol during bolus administration of intravenous contrast. Multiplanar CT image reconstructions and MIPs were obtained to evaluate the vascular anatomy. CONTRAST:  20mL OMNIPAQUE IOHEXOL 350 MG/ML SOLN COMPARISON:  PET-CT 11/17/2019, radiograph 11/24/2019, CT chest 11/12/2019 FINDINGS: Cardiovascular: Satisfactory opacification the pulmonary arteries to the segmental level. No pulmonary artery filling defects are identified. Tapering of the pulmonary arteries as they enter the region of the left lower lobe mass and hilar invasion detailed below. Central pulmonary arteries are otherwise normal caliber. The aortic root is suboptimally assessed given cardiac pulsation artifact. Atherosclerotic plaque within the normal caliber aorta. No definite acute luminal abnormality within limitations of this non tailored evaluation. Atherosclerotic calcification at the origins of the great vessels including a shared origin of the left common carotid and brachiocephalic artery. Normal cardiac size. No visible pericardial effusion. For the discernible fat plane between the left ventricular free wall and the adjacent mediastinal mass seen on comparison CT and PET though this can be partially attributable to cardiac motion and volume averaging. Mediastinum/Nodes: There is extensive direct invasion of a left lower lobe mass extending throughout the soft tissues of the left hilum and mediastinum demonstrating hypermetabolism on the comparison PET-CT compatible with a primary lung neoplasm. Previously  FDG avid enlarged AP window lymph nodes measuring up to 2.3 cm (6/71) are similar to mildly increased in size from comparison. No acute abnormality of the trachea. Loss of discernible fat plane between the mediastinal mass and the thoracic esophagus. No concerning thyroid nodules. Lungs/Pleura: Redemonstration the heterogeneous Lea enhancing lesion seen centrally in the left lower lobe and extending into the medial aspect of the lingula/left upper lobe and invading the left hilum and extending along the mediastinal borders. Additional extensive FDG avid pleural nodularity is present as well. There is a persistent moderate size loculated malignant effusion. The central airways, pulmonary arteries appear mildly tapered with truncation of the pulmonary veins with narrowing and segmental occlusions of the central airways the lingula and left lower lobe. Additional consolidative opacity and ground-glass within the aerated portions of the left lower lobe and lingula likely reflect some postobstructive infection or inflammation. There are remote postsurgical changes in the bilateral lung apices with evidence of prior thoracotomy. Findings are on a background of diffuse moderate centrilobular and severe  paraseptal emphysematous change. Chronic biapical areas of scarring are noted. Upper Abdomen: Pneumobilia noted in the liver. No other acute upper abdominal abnormality is evident. Musculoskeletal: Evidence of prior bilateral thoracotomy widening of the second interspaces bilaterally. No evidence of direct chest wall invasion at this time. Body wall edema is noted. No evidence of osseous metastatic disease. Review of the MIP images confirms the above findings. IMPRESSION: 1. No evidence of acute pulmonary artery filling defects. 2. Redemonstration of the heterogeneous enhancing lesion centrally in the left lower lobe and extending into the medial aspect of the lingula/left upper lobe as well as directly invading the left  hilum and extending along the mediastinal borders. Findings compatible with primary lung neoplasm similar to slightly increased in size. 3. Redemonstration of the previously FDG avid pleural nodularity with a moderate persistent left malignant effusion with loculation. 4. Previously FDG avid enlarged AP window lymph nodes are similar to mildly increased in size from comparison PET-CT. 5. Left lung lesion with hilar invasion results in some narrowing of the pulmonary arteries, occlusion of the pulmonary veins and segmental occlusions of the central airways of the left lower lobe and lingula. Consolidative opacity and ground-glass within the aerated portions of the left lower lobe and lingula likely reflect some postobstructive infection or inflammation. 6. Evidence of prior bilateral thoracotomy widening of the second interspaces and postsurgical changes in the lung apices. 7. Pneumobilia in the liver, correlate for prior sphincterotomy. 8. Aortic Atherosclerosis (ICD10-I70.0) 9. Emphysema (ICD10-J43.9). Electronically Signed   By: Lovena Le M.D.   On: 11/24/2019 23:10   MR BRAIN W WO CONTRAST  Result Date: 11/27/2019 CLINICAL DATA:  64 year old female with newly diagnosed lung cancer, favored to be extensive stage small cell on preliminary pathology. Staging. EXAM: MRI HEAD WITHOUT AND WITH CONTRAST TECHNIQUE: Multiplanar, multiecho pulse sequences of the brain and surrounding structures were obtained without and with intravenous contrast. CONTRAST:  7.50mL GADAVIST GADOBUTROL 1 MMOL/ML IV SOLN COMPARISON:  None. FINDINGS: Brain: Small developmental venous anomaly in the left superior frontal gyrus (normal variant - series 12, image 19). Similar small DVA in the right deep gray nuclei (series 12, image 18). Indeterminate although probably vascular related punctate focus of enhancement in the right inferior cerebellum on series 11, image 5. No other abnormal intracranial enhancement identified. No midline shift,  mass effect. No dural thickening. Chronic appearing encephalomalacia of the posterior corpus callosum near the splenium (series 5, image 13) which is associated with some nonspecific splenium and other periventricular white matter T2 and FLAIR hyperintensity (series 7, image 13). No associated enhancement. No superimposed cortical encephalomalacia. No chronic cerebral blood products identified. Mild to moderate patchy nonspecific T2 and FLAIR hyperintensity also in the pons. No restricted diffusion to suggest acute infarction. No ventriculomegaly, extra-axial collection or acute intracranial hemorrhage. Cervicomedullary junction and pituitary are within normal limits. Vascular: Major intracranial vascular flow voids are preserved. The major dural venous sinuses are enhancing and appear to be patent. Skull and upper cervical spine: Negative visible cervical spine and spinal cord. Visualized bone marrow signal is within normal limits. Sinuses/Orbits: Postoperative changes to both globes, otherwise negative orbits. Paranasal Visualized paranasal sinuses and mastoids are stable and well pneumatized. Other: Visible internal auditory structures appear normal. No suspicious scalp or face soft tissue lesion. IMPRESSION: 1. No definite metastatic disease to the brain. Several small developmental venous anomalies are noted, and a punctate area of enhancement in the right cerebellum on series 11, image 5 is also felt to be vascular  in nature. Recommend repeat Brain MRI without and with contrast in 3 months with attention to this area. 2. Chronic nonspecific white matter signal changes including some chronic encephalomalacia in the posterior corpus callosum. Electronically Signed   By: Genevie Ann M.D.   On: 11/27/2019 15:01   Cardiac event monitor  Result Date: 11/22/2019 Predominant rhythm is normal sinus Runs of NSVT with the longest being 15 beats. Runs of SVT with the longest being 9 beats.  Frequent supraventricular ectopy  Infrequent ventricular ectopy.   NM PET Image Restage (PS) Skull Base to Thigh  Result Date: 11/17/2019 CLINICAL DATA:  Initial treatment strategy for left lung mass. EXAM: NUCLEAR MEDICINE PET SKULL BASE TO THIGH TECHNIQUE: 5.62 mCi F-18 FDG was injected intravenously. Full-ring PET imaging was performed from the skull base to thigh after the radiotracer. CT data was obtained and used for attenuation correction and anatomic localization. Fasting blood glucose: 99 mg/dl COMPARISON:  Chest CT 11/12/2019 FINDINGS: Mediastinal blood pool activity: SUV max 2.23 Liver activity: SUV max NA NECK: No hypermetabolic lymph nodes in the neck. Incidental CT findings: none CHEST: Large central left lower lobe mass invading the left hilum and mediastinum is markedly hypermetabolic with SUV max of 02.72. Findings consistent with a primary lung neoplasm. There is also extensive pleural metastatic disease involving the left hemithorax with numerous large hypermetabolic pleural nodules. SUV max in these lesions ranges from 17.3 to 19.8. Associated malignant left pleural effusion. No breast masses, supraclavicular or axillary adenopathy. Severe underlying emphysematous changes. I do not see any definite metastatic pulmonary nodules in the right lung. Incidental CT findings: Age advanced vascular disease with aortic and coronary artery calcifications. ABDOMEN/PELVIS: Enlarged gastrohepatic ligament lymph node measures 19.5 mm on image 102/4. SUV max is 17.33. There is also a 19 mm left-sided retroperitoneal lymph node on image 109/4 which has an SUV max of 15.36. No findings for hepatic metastatic disease or adrenal gland metastatic disease. Left adrenal gland lesion measures 10 Hounsfield units and is not show hypermetabolism consistent with a benign adenoma. No metastatic disease involving the pelvis. Incidental CT findings: Aortic calcifications. Evidence of prior bowel surgery. Advanced vascular calcifications. SKELETON: No  focal hypermetabolic activity to suggest skeletal metastasis. Incidental CT findings: none IMPRESSION: 1. Large hypermetabolic central left lower lobe mass invading the left hilum and mediastinum and consistent with primary lung neoplasm. There is also extensive left-sided pleural metastatic disease and a malignant left pleural effusion. 2. Malignant upper abdominal lymphadenopathy. No hepatic or adrenal gland metastasis are demonstrated. 3. No findings for metastatic pulmonary nodules in the right lung. 4. No findings suspicious for osseous metastatic disease. Electronically Signed   By: Marijo Sanes M.D.   On: 11/17/2019 15:19   DG Chest Port 1 View  Result Date: 11/24/2019 CLINICAL DATA:  Shortness of breath. EXAM: PORTABLE CHEST 1 VIEW COMPARISON:  November 23, 2019 FINDINGS: Marked severity atelectasis and/or infiltrate is seen within the left lung base. This is increased in severity when compared to the prior study. There is a small left pleural effusion which is also increased in size. No pneumothorax is identified. The heart size and mediastinal contours are within normal limits. A small deformity is seen along the lateral aspect of the upper left chest wall, with subsequent herniation of a small amount of left upper lobe. This is present on the prior study. There is mild dextroscoliosis of the midthoracic spine. IMPRESSION: 1. Marked severity left basilar atelectasis and/or infiltrate, increased in severity when compared to  the prior study. 2. Small left pleural effusion which is increased in size. Electronically Signed   By: Virgina Norfolk M.D.   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 Request received for diagnostic and therapeutic left thoracentesis. EXAM: ULTRASOUND GUIDED DIAGNOSTIC AND  THERAPEUTIC LEFT THORACENTESIS MEDICATIONS: 1% lidocaine to skin and subcutaneous tissue COMPLICATIONS: None immediate. PROCEDURE: An ultrasound guided thoracentesis was thoroughly discussed with the patient and questions answered. The benefits, risks, alternatives and complications were also discussed. The patient understands and wishes to proceed with the procedure. Written consent was obtained. Ultrasound was performed to localize and mark an adequate pocket of fluid in the left chest. The area was then prepped and draped in the normal sterile fashion. 1% Lidocaine was used for local anesthesia. Under ultrasound guidance a 6 Fr Safe-T-Centesis catheter was introduced. Thoracentesis was performed. The catheter was removed and a dressing applied. FINDINGS: A total of approximately 1.1 liters of slightly hazy, yellow fluid was removed. Samples were sent to the laboratory as requested by the clinical team. IMPRESSION: Successful ultrasound guided diagnostic and therapeutic left thoracentesis yielding 1.1 liters of pleural fluid. Read by: Rowe Robert, PA-C Electronically Signed   By: Jacqulynn Cadet M.D.   On: 11/23/2019 11:13       Subjective: [place] okay.  Has some chest discomfort from her effusion, but does not feel terribly short of breath, able to ambulate to the bathroom without difficulty.  No fever overnight.  No thick sputum.  No hemoptysis.  No confusion.  Discharge Exam: Vitals:   11/27/19 0823 11/27/19 1145  BP:    Pulse:    Resp:    Temp:    SpO2: 95% 96%   Vitals:   11/26/19 2205 11/27/19 0750 11/27/19 0823 11/27/19 1145  BP: 108/79 (!) 141/57    Pulse: 100 89    Resp: 20 14    Temp: 98.3 F (36.8 C) 98.2 F (36.8 C)    TempSrc: Oral     SpO2: 95% 93% 95% 96%  Weight:      Height:        General: Pt is alert, awake, not in acute distress Cardiovascular: RRR, nl S1-S2, no murmurs appreciated.   No LE edema.   Respiratory: Nasal cannula in place.  Normal respiratory  rate and rhythm.  No wheezing, lung sounds diminished, more on the left. Abdominal: Abdomen soft and non-tender.  No distension or HSM.   Neuro/Psych: Strength symmetric in upper and lower extremities.  Judgment and insight appear normal.   The results of significant diagnostics from this hospitalization (including imaging, microbiology, ancillary and laboratory) are listed below for reference.     Microbiology: Recent Results (from the past 240 hour(s))  SARS CORONAVIRUS 2 (TAT 6-24 HRS) Nasopharyngeal Nasopharyngeal Swab     Status: None   Collection Time: 11/20/19 11:56 AM   Specimen: Nasopharyngeal Swab  Result Value Ref Range Status   SARS Coronavirus 2 NEGATIVE NEGATIVE Final    Comment: (NOTE) SARS-CoV-2 target nucleic acids are NOT DETECTED.  The SARS-CoV-2 RNA is generally detectable in upper and lower respiratory specimens during the acute phase of infection. Negative results do not preclude SARS-CoV-2 infection, do not rule out co-infections with other pathogens, and should not be used as the sole basis for treatment or other patient management decisions. Negative  results must be combined with clinical observations, patient history, and epidemiological information. The expected result is Negative.  Fact Sheet for Patients: SugarRoll.be  Fact Sheet for Healthcare Providers: https://www.woods-mathews.com/  This test is not yet approved or cleared by the [place] FDA and  has been authorized for detection and/or diagnosis of SARS-CoV-2 by FDA under an Emergency Use Authorization (EUA). This EUA will remain  in effect (meaning this test can be used) for the duration of the COVID-19 declaration under Se ction 564(b)(1) of the Act, 21 U.S.C. section 360bbb-3(b)(1), unless the authorization is terminated or revoked sooner.  Performed at [facility], [place] [address], [place], [place] [address]   Body fluid culture      Status: None   Collection Time: 11/23/19 10:28 AM   Specimen: PATH Cytology Pleural fluid  Result Value Ref Range Status   Specimen Description   Final    PLEURAL Performed at [facility] [address], [place], [place] [address]    Special Requests   Final    NONE Performed at [facility], [place] [address] [place], [place] [address]    Gram Stain   Final    FEW WBC PRESENT, PREDOMINANTLY MONONUCLEAR NO ORGANISMS SEEN    Culture   Final    NO GROWTH 3 DAYS Performed at [facility] [address] [place], [place] [address]    Report Status 11/26/2019 FINAL  Final  SARS Coronavirus 2 by RT PCR (hospital order, performed in [facility] hospital lab) Nasopharyngeal Nasopharyngeal Swab     Status: None   Collection Time: 11/24/19 11:59 PM   Specimen: Nasopharyngeal Swab  Result Value Ref Range Status   SARS Coronavirus 2 NEGATIVE NEGATIVE Final    Comment: (NOTE) SARS-CoV-2 target nucleic acids are NOT DETECTED.  The SARS-CoV-2 RNA is generally detectable in upper and lower respiratory specimens during the acute phase of infection. The lowest concentration of SARS-CoV-2 viral copies this assay can detect is 250 copies / mL. A negative result does not preclude SARS-CoV-2 infection and should not be used as the sole basis for treatment or other patient management decisions.  A negative result may occur with improper specimen collection / handling, submission of specimen other than nasopharyngeal swab, presence of viral mutation(s) within the areas targeted by this assay, and inadequate number of viral copies (<250 copies / mL). A negative result must be combined with clinical observations, patient history, and epidemiological information.  Fact Sheet for Patients:   StrictlyIdeas.no  Fact Sheet for Healthcare Providers: BankingDealers.co.za  This test is not yet approved or  cleared  by the [place] FDA and has been authorized for detection and/or diagnosis of SARS-CoV-2 by FDA under an Emergency Use Authorization (EUA).  This EUA will remain in effect (meaning this test can be used) for the duration of the COVID-19 declaration under Section 564(b)(1) of the Act, 21 U.S.C. section 360bbb-3(b)(1), unless the authorization is terminated or revoked sooner.  Performed at [facility], [place] [address] [place],  [address]   Blood culture (routine x 2)     Status: None (Preliminary result)   Collection Time: 11/25/19 12:38 AM   Specimen: BLOOD  Result Value Ref Range Status   Specimen Description BLOOD SITE NOT SPECIFIED  Final   Special Requests   Final    BOTTLES DRAWN AEROBIC AND ANAEROBIC Blood Culture adequate volume   Culture   Final    NO GROWTH  2 DAYS Performed at [facility], [place] [address], [place], [place] [address]    Report Status PENDING  Incomplete  Blood culture (routine x 2)     Status: None (Preliminary result)   Collection Time: 11/25/19 12:38 AM   Specimen: BLOOD  Result Value Ref Range Status   Specimen Description BLOOD SITE NOT SPECIFIED  Final   Special Requests   Final    BOTTLES DRAWN AEROBIC AND ANAEROBIC Blood Culture adequate volume   Culture   Final    NO GROWTH 2 DAYS Performed at [facility], [place] [address], [place], [place] [address]    Report Status PENDING  Incomplete     Labs: BNP (last 3 results) No results for input(s): BNP in the last 8760 hours. Basic Metabolic Panel: Recent Labs  Lab 11/24/19 1958 11/25/19 0408 11/27/19 0300  NA 134* 135 138  K 3.9 4.4 4.3  CL 98 100 104  CO2 21* 25 23  GLUCOSE 117* 97 106*  BUN 9 10 9   CREATININE 0.60 0.67 0.54  CALCIUM 9.0 8.8* 8.4*   Liver Function Tests: Recent Labs  Lab 11/25/19 0408  AST 19  ALT 16  ALKPHOS 47  BILITOT 0.6  PROT 6.0*  ALBUMIN 2.4*   No results for input(s): LIPASE, AMYLASE in the last 168 hours. No results  for input(s): AMMONIA in the last 168 hours. CBC: Recent Labs  Lab 11/24/19 1958 11/25/19 0408 11/27/19 0300  WBC 17.8* 16.2* 15.3*  NEUTROABS 12.3*  --  10.6*  HGB 13.3 12.0 11.0*  HCT 40.8 38.2 33.6*  MCV 84.8 86.8 85.7  PLT 447* 431* 411*   Cardiac Enzymes: No results for input(s): CKTOTAL, CKMB, CKMBINDEX, TROPONINI in the last 168 hours. BNP: Invalid input(s): POCBNP CBG: No results for input(s): GLUCAP in the last 168 hours. D-Dimer No results for input(s): DDIMER in the last 72 hours. Hgb A1c No results for input(s): HGBA1C in the last 72 hours. Lipid Profile No results for input(s): CHOL, HDL, LDLCALC, TRIG, CHOLHDL, LDLDIRECT in the last 72 hours. Thyroid function studies No results for input(s): TSH, T4TOTAL, T3FREE, THYROIDAB in the last 72 hours.  Invalid input(s): FREET3 Anemia work up No results for input(s): VITAMINB12, FOLATE, FERRITIN, TIBC, IRON, RETICCTPCT in the last 72 hours. Urinalysis    Component Value Date/Time   COLORURINE YELLOW 11/11/2019 1145   APPEARANCEUR CLEAR 11/11/2019 1145   LABSPEC <=1.005 (A) 11/11/2019 1145   PHURINE 6.5 11/11/2019 1145   GLUCOSEU NEGATIVE 11/11/2019 1145   HGBUR NEGATIVE 11/11/2019 1145   BILIRUBINUR NEGATIVE 11/11/2019 1145   BILIRUBINUR negative 09/24/2019 1643   KETONESUR NEGATIVE 11/11/2019 1145   PROTEINUR Positive (A) 09/24/2019 1643   PROTEINUR NEGATIVE 12/27/2009 1048   UROBILINOGEN 0.2 11/11/2019 1145   NITRITE NEGATIVE 11/11/2019 1145   LEUKOCYTESUR NEGATIVE 11/11/2019 1145   Sepsis Labs Invalid input(s): PROCALCITONIN,  WBC,  LACTICIDVEN Microbiology Recent Results (from the past 240 hour(s))  SARS CORONAVIRUS 2 (TAT 6-24 HRS) Nasopharyngeal Nasopharyngeal Swab     Status: None   Collection Time: 11/20/19 11:56 AM   Specimen: Nasopharyngeal Swab  Result Value Ref Range Status   SARS Coronavirus 2 NEGATIVE NEGATIVE Final    Comment: (NOTE) SARS-CoV-2 target nucleic acids are NOT  DETECTED.  The SARS-CoV-2 RNA is generally detectable in upper and lower respiratory specimens during the acute phase of infection. Negative results do not preclude SARS-CoV-2 infection, do not rule out co-infections with other pathogens, and should not be  used as the sole basis for treatment or other patient management decisions. Negative results must be combined with clinical observations, patient history, and epidemiological information. The expected result is Negative.  Fact Sheet for Patients: SugarRoll.be  Fact Sheet for Healthcare Providers: https://www.woods-mathews.com/  This test is not yet approved or cleared by the [place] FDA and  has been authorized for detection and/or diagnosis of SARS-CoV-2 by FDA under an Emergency Use Authorization (EUA). This EUA will remain  in effect (meaning this test can be used) for the duration of the COVID-19 declaration under Se ction 564(b)(1) of the Act, 21 U.S.C. section 360bbb-3(b)(1), unless the authorization is terminated or revoked sooner.  Performed at [facility], [place] [address], [place], [place] [address]   Body fluid culture     Status: None   Collection Time: 11/23/19 10:28 AM   Specimen: PATH Cytology Pleural fluid  Result Value Ref Range Status   Specimen Description   Final    PLEURAL Performed at [facility] [address], [place], [place] [address]    Special Requests   Final    NONE Performed at [facility], [place] [address], [place], [place] [address]    Gram Stain   Final    FEW WBC PRESENT, PREDOMINANTLY MONONUCLEAR NO ORGANISMS SEEN    Culture   Final    NO GROWTH 3 DAYS Performed at [facility] [address] [place], [place] [address]    Report Status 11/26/2019 FINAL  Final  SARS Coronavirus 2 by RT PCR (hospital order, performed in [facility] hospital lab) Nasopharyngeal Nasopharyngeal Swab      Status: None   Collection Time: 11/24/19 11:59 PM   Specimen: Nasopharyngeal Swab  Result Value Ref Range Status   SARS Coronavirus 2 NEGATIVE NEGATIVE Final    Comment: (NOTE) SARS-CoV-2 target nucleic acids are NOT DETECTED.  The SARS-CoV-2 RNA is generally detectable in upper and lower respiratory specimens during the acute phase of infection. The lowest concentration of SARS-CoV-2 viral copies this assay can detect is 250 copies / mL. A negative result does not preclude SARS-CoV-2 infection and should not be used as the sole basis for treatment or other patient management decisions.  A negative result may occur with improper specimen collection / handling, submission of specimen other than nasopharyngeal swab, presence of viral mutation(s) within the areas targeted by this assay, and inadequate number of viral copies (<250 copies / mL). A negative result must be combined with clinical observations, patient history, and epidemiological information.  Fact Sheet for Patients:   StrictlyIdeas.no  Fact Sheet for Healthcare Providers: BankingDealers.co.za  This test is not yet approved or  cleared by the [place] FDA and has been authorized for detection and/or diagnosis of SARS-CoV-2 by FDA under an Emergency Use Authorization (EUA).  This EUA will remain in effect (meaning this test can be used) for the duration of the COVID-19 declaration under Section 564(b)(1) of the Act, 21 U.S.C. section 360bbb-3(b)(1), unless the authorization is terminated or revoked sooner.  Performed at [facility], [place] [address] [place], [place] [address]   Blood culture (routine x 2)     Status: None (Preliminary result)   Collection Time: 11/25/19 12:38 AM   Specimen: BLOOD  Result Value Ref Range Status   Specimen Description BLOOD SITE NOT SPECIFIED  Final   Special Requests   Final    BOTTLES DRAWN AEROBIC AND ANAEROBIC Blood Culture  adequate volume   Culture   Final    NO GROWTH 2 DAYS Performed at [facility], [place] [address], [place], [place] [address]    Report Status PENDING  Incomplete  Blood culture (routine x 2)     Status: None (Preliminary result)   Collection Time: 11/25/19 12:38 AM   Specimen: BLOOD  Result Value Ref Range Status   Specimen Description BLOOD SITE NOT SPECIFIED  Final   Special Requests   Final    BOTTLES DRAWN AEROBIC AND ANAEROBIC Blood Culture adequate volume   Culture   Final    NO GROWTH 2 DAYS Performed at [facility], [place] [address], [place], [place] [address]    Report Status PENDING  Incomplete     Time coordinating discharge: 35 minutes The [place] controlled substances registry was reviewed for this patient prior to filling the <5 days supply controlled substances script.      SIGNED:   Edwin Dada, MD  [facility] 11/27/2019, 3:21 PM

## 2019-11-27 NOTE — Telephone Encounter (Signed)
HFU has been scheduled for 01/11/2020 with Dr. Lamonte Sakai. Pt is aware and voiced her understanding.  Nothing further is needed.

## 2019-11-27 NOTE — TOC Transition Note (Signed)
Transition of Care [facility]) - CM/SW Discharge Note   Patient Details  Name: MARCENA DIAS MRN: [id number] Date of Birth: [birth year]  Transition of Care [facility]) CM/SW Contact:  Benard Halsted, LCSW Phone Number: 11/27/2019, 11:42 AM   Clinical Narrative:    CSW spoke with patient regarding oxygen set up. CSW confirmed [place] address with her. She confirmed that someone is at the house available to receive equipment. CSW offered choice and accepted [employer] for oxygen. CSW sent referral to [employer]. Her brother is at bedside and will transport her by car at discharge; CSW requested portable oxygen tank to be delivered to her hospital room prior to discharge for transport. CSW cautioned patient against being near her oxygen while smoking. She reported understanding and stated she only smokes outside.     Final next level of care: Home/Self Care Barriers to Discharge: Barriers Resolved   Patient Goals and CMS Choice Patient states their goals for this hospitalization and ongoing recovery are:: Return home   Choice offered to / list presented to : Patient  Discharge Placement                Patient to be transferred to facility by: Car Name of family member notified: Brother at bedside Patient and family notified of of transfer: 11/27/19  Discharge Plan and Services   Discharge Planning Services: CM Consult Post Acute Care Choice: Durable Medical Equipment          DME Arranged: Oxygen DME Agency: [employer] Date DME Agency Contacted: 11/27/19 Time DME Agency Contacted: [id number] Representative spoke with at DME Agency: [place] ([place]) Interventions     Readmission Risk Interventions No flowsheet data found.

## 2019-11-27 NOTE — Progress Notes (Signed)
NAME:  [name] [name], MRN:  [id number], DOB:  1956-01-10, LOS: 2 ADMISSION DATE:  11/24/2019, CONSULTATION DATE:  8/5 REFERRING MD:  Dr. Cathlean Sauer, CHIEF COMPLAINT:  Lung mass   Brief History   64 year old female with COPD presented to [facility] ED 8/4 with dyspnea. Lung mass with postobstructive pneumonia and pleural effusion identified on CT.   History of present illness   64 year old female with PMH as below, which is significant for current everyday smoker, COPD, bullous emphysema with history of PTX, HTN, and neurofibromatosis. She has not been on her scheduled nebulizers since 2018 due to insurance issues. She has remained in her usual state of health until about May of this year when she began to develop palpitations. She sought cardiology consultation and she was started on metoprolol. Course then complicated by progressive dyspnea and weakness. She developed L flank/abdominal pain and CT scan was ordered for 7/22, which showed several soft tissue masses in the lungs and effusion on the L. She was sent for thoracentesis on 8/2 with near resolution of her symptoms. Symptoms rapidly returned prompting [facility] admission on 8/4. PCCM consulted 8/5 for lung mass.   Past Medical History   has a past medical history of Allergy, Benign tumor, Carcinoid tumor of abdomen, Cataract, Family history of adverse reaction to anesthesia, Hypertension, Lower back pain, Neurofibromatosis, type I (von Recklinghausen's disease) ([place]), Pneumothorax on left, and Pneumothorax on right.   Significant Hospital Events     Consults:    Procedures:    Significant Diagnostic Tests:  8/2 Thoracentesis >>> 8/3 CT chest > No pulmonary artery filing defects. Re-demonstration of enhancing lesions in the LLL. Left lung lesion with hilar invasion results in some narrowing of the pulmonary arteries, occlusion of the pulmonary veins and segmental occlusions of the central airways of the left lower lobe and lingula.  Consolidative opacity and ground-glass within the aerated portions of the left lower lobe and lingula likely reflect some postobstructive infection or inflammation.   Micro Data:  Blood 8/4 >  Antimicrobials:  CTX 8/4 >  Azithromycin 8/4>  Interim history/subjective:  Pain and breathing unchanged  Objective   Blood pressure (!) 141/57, pulse 89, temperature 98.2 F (36.8 C), resp. rate 14, height 5' 2.5" (1.588 m), weight 79.2 kg, SpO2 96 %.        Intake/Output Summary (Last 24 hours) at 11/27/2019 1305 Last data filed at 11/26/2019 2219 Gross per 24 hour  Intake 120 ml  Output --  Net 120 ml   Filed Weights   11/24/19 1946 11/25/19 2116  Weight: 49 kg 79.2 kg    Examination: General: thin adult female in NAD HENT: [place]/AT, PERRL, no JVD, neurofibromatosis associated lesions.  Lungs: Clear bilateral breath sounds Cardiovascular: RRR, no MRG Abdomen: Soft, non-tender, non-distended Extremities: No acute deformity or ROM limitation Neuro: Alert, oriented, nonfocal.   Resolved Hospital Problem list     Assessment & Plan:   New dx extensive stage small cell lung cancer:  - greatly appreciate Dr Worthy Flank assistance. Planning for port next week and then initiate chemo next week -ibuprofen +/- narcotics for pain / pleurisy -agree with finishing 7 days abx, although unclear whether there is infection here   COPD without acute exacerbation -will set up OV with me as an outpt - continue home duonebs  Active smoker -will need cessation assistance   Labs   CBC: Recent Labs  Lab 11/24/19 1958 11/25/19 0408 11/27/19 0300  WBC 17.8*  16.2* 15.3*  NEUTROABS 12.3*  --  10.6*  HGB 13.3 12.0 11.0*  HCT 40.8 38.2 33.6*  MCV 84.8 86.8 85.7  PLT 447* 431* 411*    Basic Metabolic Panel: Recent Labs  Lab 11/24/19 1958 11/25/19 0408 11/27/19 0300  NA 134* 135 138  K 3.9 4.4 4.3  CL 98 100 104  CO2 21* 25 23  GLUCOSE 117* 97 106*  BUN 9 10 9   CREATININE 0.60 0.67  0.54  CALCIUM 9.0 8.8* 8.4*   GFR: Estimated Creatinine Clearance: 68.3 mL/min (by C-G formula based on SCr of 0.54 mg/dL). Recent Labs  Lab 11/24/19 1958 11/25/19 0038 11/25/19 0408 11/27/19 0300  PROCALCITON  --  <0.10  --   --   WBC 17.8*  --  16.2* 15.3*    Liver Function Tests: Recent Labs  Lab 11/25/19 0408  AST 19  ALT 16  ALKPHOS 47  BILITOT 0.6  PROT 6.0*  ALBUMIN 2.4*   No results for input(s): LIPASE, AMYLASE in the last 168 hours. No results for input(s): AMMONIA in the last 168 hours.  ABG    Component Value Date/Time   PHART 7.414 10/10/2015 0930   PCO2ART 35.4 10/10/2015 0930   PO2ART 83.2 10/10/2015 0930   HCO3 22.2 10/10/2015 0930   TCO2 19.7 10/10/2015 0930   ACIDBASEDEF 1.3 10/10/2015 0930   O2SAT 96.4 10/10/2015 0930     Baltazar Apo, MD, PhD 11/27/2019, 1:06 PM [facility] [phone] or if no answer [phone]

## 2019-11-27 NOTE — Progress Notes (Signed)
Patient Saturations on Room Air at Rest = 90%  Patient Saturations on [employer] while Ambulating = 87%  Patient Saturations on 3 Liters of oxygen while Ambulating = 93%  Pt Stats dropped when ambulating and was winded. Once O2 was placed , she was more comfortable.

## 2019-11-27 NOTE — TOC Initial Note (Signed)
Transition of Care [facility]) - Initial/Assessment Note    Patient Details  Name: [name] [name] MRN: [id number] Date of Birth: Jul 16, 1955  Transition of Care [facility]) CM/SW Contact:    Benard Halsted, LCSW Phone Number: 11/27/2019, 10:45 AM  Clinical Narrative:                 CSW received referral for home oxygen set up. CSW will contact [employer] for delivery once O2 ambulatory sat note is in; RN aware.   Expected Discharge Plan: Home/Self Care Barriers to Discharge: Continued Medical Work up   Patient Goals and CMS Choice        Expected Discharge Plan and Services Expected Discharge Plan: Home/Self Care   Discharge Planning Services: CM Consult Post Acute Care Choice: Durable Medical Equipment Living arrangements for the past 2 months: Single Family Home                 DME Arranged: Oxygen DME Agency: [employer]                  Prior Living Arrangements/Services Living arrangements for the past 2 months: Single Family Home Lives with:: Self Patient language and need for interpreter reviewed:: Yes Do you feel safe going back to the place where you live?: Yes      Need for Family Participation in Patient Care: No (Comment) Care giver support system in place?: Yes (comment)   Criminal Activity/Legal Involvement Pertinent to Current Situation/Hospitalization: No - Comment as needed  Activities of Daily Living Home Assistive Devices/Equipment: None ADL Screening (condition at time of admission) Patient's cognitive ability adequate to safely complete daily activities?: Yes Is the patient deaf or have difficulty hearing?: No Does the patient have difficulty seeing, even when wearing glasses/contacts?: No Does the patient have difficulty concentrating, remembering, or making decisions?: No Patient able to express need for assistance with ADLs?: No Does the patient have difficulty dressing or bathing?: No Independently performs ADLs?: Yes (appropriate for developmental  age) Does the patient have difficulty walking or climbing stairs?: No Weakness of Legs: None Weakness of Arms/Hands: None  Permission Sought/Granted Permission sought to share information with : Chartered certified accountant granted to share information with : Yes, Verbal Permission Granted              Emotional Assessment Appearance:: Appears stated age     Orientation: : Oriented to Self, Oriented to Place, Oriented to  Time, Oriented to Situation Alcohol / Substance Use: Tobacco Use Psych Involvement: No (comment)  Admission diagnosis:  Shortness of breath [R06.02] Tachycardia [R00.0] Hypoxia [R09.02] Acute hypoxemic respiratory failure ([place]) [J96.01] Patient Active Problem List   Diagnosis Date Noted  . Acute hypoxemic respiratory failure ([place]) 11/25/2019  . Pleural effusion on left 11/23/2019  . Educated about COVID-19 virus infection 09/27/2019  . SOB (shortness of breath) 09/24/2019  . Palpitations 09/24/2019  . Proteinuria 09/24/2019  . PAC (premature atrial contraction) 09/24/2019  . COPD (chronic obstructive pulmonary disease) with emphysema ([place]) 08/02/2015  . Cough 08/02/2015  . GERD (gastroesophageal reflux disease) 08/02/2015  . Tobacco user 08/02/2015  . Exertional dyspnea 08/02/2015  . Cervical lymphadenopathy 12/29/2014  . Tension headache 03/15/2014  . Trapezius muscle spasm 03/15/2014  . Screening for malignant neoplasm of the cervix 06/05/2013  . Exocrine pancreatic insufficiency 07/10/2011  . General medical examination 01/29/2011  . Neuroendocrine tumor of duodenum [id number], s/p whipple 01/04/2010 12/18/2010  . 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  INSOMNIA-SLEEP DISORDER-UNSPEC 11/22/2009  . NONSPECIFIC ABN FINDING RAD & OTH EXAM GI TRACT 11/15/2009  . TRANSAMINASES, SERUM, ELEVATED 11/03/2009  . Neurofibromatosis, type 1 (von Recklinghausen's disease) ([place]) 11/01/2009  . TOBACCO USE  11/01/2009  . OSTEOPENIA 11/01/2009  . ABDOMINAL PAIN, RIGHT UPPER QUADRANT 11/01/2009   PCP:  [name], DO Pharmacy:   [facility], [address] [place]. [place] [address] Phone: [phone] Fax: [phone]     Social Determinants of Health (SDOH) Interventions    Readmission Risk Interventions No flowsheet data found.

## 2019-11-27 NOTE — Progress Notes (Signed)
START ON PATHWAY REGIMEN - Small Cell Lung     Cycles 1 through 4: A cycle is every 21 days:     Durvalumab      Carboplatin      Etoposide    Cycles 5 and beyond: A cycle is every 28 days:     Durvalumab   **Always confirm dose/schedule in your pharmacy ordering system**  Patient Characteristics: Newly Diagnosed, Preoperative or Nonsurgical Candidate (Clinical Staging), First Line, Extensive Stage Therapeutic Status: Newly Diagnosed, Preoperative or Nonsurgical Candidate (Clinical Staging) AJCC T Category: cT3 AJCC N Category: cN2 AJCC M Category: cM1a AJCC 8 Stage Grouping: IVA Stage Classification: Extensive  Intent of Therapy: Non-Curative / Palliative Intent, Discussed with Patient

## 2019-11-27 NOTE — Telephone Encounter (Signed)
No that's ok, will work

## 2019-11-30 ENCOUNTER — Telehealth: Payer: Self-pay | Admitting: *Deleted

## 2019-11-30 ENCOUNTER — Encounter: Payer: Self-pay | Admitting: Internal Medicine

## 2019-11-30 LAB — CULTURE, BLOOD (ROUTINE X 2)
Culture: NO GROWTH
Culture: NO GROWTH
Special Requests: ADEQUATE
Special Requests: ADEQUATE

## 2019-11-30 NOTE — Telephone Encounter (Signed)
Pt was not scheduled for port insertion at [facility] for 8/9 due to scheduling being behind. Called [facility] and scheduled pt for Wed 8/11 @9am . Advised pt to be at [facility] at 730am for 9am port insertion. Nothing to eat or drink after midnight. Also, to come to [facility] for treatment thereafter no later than 130pm. Pt and pt brother verbalized understanding.

## 2019-11-30 NOTE — Telephone Encounter (Signed)
1st attempt. Unable to reach patient.  Sent my chart msg.

## 2019-11-30 NOTE — Telephone Encounter (Signed)
Spoke with patient and she was diagnosed Friday with stage 4 lung cancer and will begin chemo Wednesday  She is not sure what her schedule will be at this point and wanted Dr Percival Spanish to know and if she needed appointment at this time.  Will forward to Dr Percival Spanish for review

## 2019-11-30 NOTE — Progress Notes (Signed)
Pharmacist Chemotherapy Monitoring - Initial Assessment    Anticipated start date: 12/02/2019   Regimen:  . Are orders appropriate based on the patient's diagnosis, regimen, and cycle? Yes . Does the plan date match the patient's scheduled date? Yes . Is the sequencing of drugs appropriate? Yes . Are the premedications appropriate for the patient's regimen? Yes . Prior Authorization for treatment is: Approved o If applicable, is the correct biosimilar selected based on the patient's insurance? not applicable  Organ Function and Labs: Marland Kitchen Are dose adjustments needed based on the patient's renal function, hepatic function, or hematologic function? Yes . Are appropriate labs ordered prior to the start of patient's treatment? Yes . Other organ system assessment, if indicated: N/A . The following baseline labs, if indicated, have been ordered: durvalumab: baseline TSH +/- T4  Dose Assessment: . Are the drug doses appropriate? Yes . Are the following correct: o Drug concentrations Yes o IV fluid compatible with drug Yes o Administration routes Yes o Timing of therapy Yes . If applicable, does the patient have documented access for treatment and/or plans for port-a-cath placement? yes . If applicable, have lifetime cumulative doses been properly documented and assessed? yes Lifetime Dose Tracking  No doses have been documented on this patient for the following tracked chemicals: Doxorubicin, Epirubicin, Idarubicin, Daunorubicin, Mitoxantrone, Bleomycin, Oxaliplatin, Carboplatin, Liposomal Doxorubicin  o   Toxicity Monitoring/Prevention: . The patient has the following take home antiemetics prescribed: Prochlorperazine . The patient has the following take home medications prescribed: N/A . Medication allergies and previous infusion related reactions, if applicable, have been reviewed and addressed. Yes . The patient's current medication list has been assessed for drug-drug interactions with  their chemotherapy regimen. no significant drug-drug interactions were identified on review.  Order Review: . Are the treatment plan orders signed? Yes . Is the patient scheduled to see a provider prior to their treatment? yes  I verify that I have reviewed each item in the above checklist and answered each question accordingly.  Makaria Poarch D 11/30/2019 3:12 PM

## 2019-11-30 NOTE — Progress Notes (Signed)
[place] OFFICE PROGRESS NOTE  [name], DO [address] [place] [address]  DIAGNOSIS: Extensive stage small cell lung cancer, the patient presented with a large left central left upper lobe lung mass invading the left hilum and mediastinum with extensive left-sided pleural metastatic disease and a malignant left pleural effusion.  The patient had associated upper abdominal lymphadenopathy.  She was diagnosed in August 2021.  PRIOR THERAPY: None  CURRENT THERAPY: Palliative systemic chemotherapy with carboplatin for an AUC of 5 on day 1, and etoposide 100 mg/m on days 1, 2, and 3 and Imfinzi 1500 mg IV every 3 weeks with Cosela for myeloprotection.  First dose expected on 12/02/2019.  INTERVAL HISTORY: [name] [name] 64 y.o. female returns to the clinic today for a follow-up visit.  The patient was recently hospitalized from 8/3-8/6.  She was recently diagnosed with extensive stage small cell lung cancer.  Since being discharged from the hospital, she is still short of breath with minimal exertion. She is on 3 L of oxygen via nasal cannula.  She denies any recent fever, chills, or night sweats.  She has been losing weight recently prior to her recent diagnosis.  She denies any hemoptysis. She is having pain in the left lower rib cage/LUQ and it radiates to her back. She is taking norco for her pain and is requesting a refill. She descibes her pain as "swollen" and "tight". She denies any nausea, vomiting, or diarrhea. Her constipation is better.  She denies any headache or visual changes.  She recently had a brain MRI performed.  The patient is here today for evaluation and more detailed discussion about her recommended treatment.   MEDICAL HISTORY: Past Medical History:  Diagnosis Date  . Allergy   . 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 Carcinoid tumor of abdomen   . Cataract    both eyes  . Family history of adverse reaction to anesthesia     Mother had trouble waking up  . Hypertension   . Lower back pain   . Neurofibromatosis, type I (von Recklinghausen's disease) ([place])   . Pneumothorax on left   . Pneumothorax on right     ALLERGIES:  has No Known Allergies.  MEDICATIONS:  Current Outpatient Medications  Medication Sig Dispense Refill  . AYR SALINE NASAL NA Place 1 spray into the nose daily as needed (dryness).    . Cholecalciferol (D3) 50 MCG (2000 UT) TABS Take 2,000 Units by mouth daily with breakfast.     . HYDROcodone-acetaminophen (NORCO/VICODIN) 5-325 MG tablet Take 1 tablet by mouth every 6 (six) hours as needed for up to 5 days. 30 tablet 0  . lidocaine-prilocaine (EMLA) cream Apply to the Port-A-Cath site 30-60 minutes before treatment. 30 g 0  . metoprolol tartrate (LOPRESSOR) 25 MG tablet Take 0.5 tablets (12.5 mg total) by mouth 2 (two) times daily. 90 tablet 3  . Multiple Vitamins-Minerals (MULTIVITAMINS THER. W/MINERALS) TABS Take 1 tablet by mouth daily.    . nicotine polacrilex (COMMIT) 4 MG lozenge Take 4 mg by mouth as needed for smoking cessation.    . prochlorperazine (COMPAZINE) 10 MG tablet Take 1 tablet (10 mg total) by mouth every 6 (six) hours as needed for nausea or vomiting. 30 tablet 0  . traZODone (DESYREL) 50 MG tablet Take 1 tablet (50 mg total) by mouth at bedtime as needed for sleep. (Patient taking differently: Take 50 mg by  mouth at bedtime. ) 30 tablet 1  . azithromycin (ZITHROMAX) 250 MG tablet Take 1 tablet (250 mg total) by mouth daily. (Patient not taking: Reported on 12/01/2019) 2 each 0  . cefdinir (OMNICEF) 300 MG capsule Take 1 capsule (300 mg total) by mouth every 12 (twelve) hours. (Patient not taking: Reported on 12/01/2019) 8 capsule 0   No current facility-administered medications for this visit.    SURGICAL HISTORY:  Past Surgical History:  Procedure Laterality Date  . ABDOMINAL HYSTERECTOMY  04/23/1997   fibroids  TAH/ BSO  . CHOLECYSTECTOMY    . COLONOSCOPY  2006  .  EYE SURGERY Left 11/2015   cataract and astigmatism surgery with Intraocular Lens left eye  . LASIK    . LUNG SURGERY  2008?   d/t pneumothroax  . MEMBRANE PEEL Left 08/16/2014   Procedure: MEMBRANE PEEL LEFT EYE ;  Surgeon: Sherlynn Stalls, MD;  Location: [facility];  Service: Ophthalmology;  Laterality: Left;  . OOPHORECTOMY    . PARS PLANA VITRECTOMY Left 08/16/2014   Procedure: PARS PLANA VITRECTOMY WITH 25 GAUGE;  Surgeon: Sherlynn Stalls, MD;  Location: [facility];  Service: Ophthalmology;  Laterality: Left;  . TONSILLECTOMY    . WHIPPLE PROCEDURE      REVIEW OF SYSTEMS:   Review of Systems  Constitutional: Positive for fatigue, appetite change, weight loss.  Negative for chills and fever HENT: Negative for mouth sores, nosebleeds, sore throat and trouble swallowing.   Eyes: Negative for eye problems and icterus.  Respiratory: Positive for cough and shortness of breath. Negative for hemoptysis and wheezing.   Cardiovascular: Positive for left lower rib pain/LUQ pain. Negative for leg swelling.  Gastrointestinal: Positive for mild constipation (resolved). Negative for abdominal pain, diarrhea, nausea and vomiting.  Genitourinary: Negative for bladder incontinence, difficulty urinating, dysuria, frequency and hematuria.   Musculoskeletal: Positive for pain that radiates to the left posterior thoracic region. Negative for gait problem, neck pain and neck stiffness.  Skin: Negative for itching and rash.  Neurological: Negative for dizziness, extremity weakness, gait problem, headaches, light-headedness and seizures.  Hematological: Negative for adenopathy. Does not bruise/bleed easily.  Psychiatric/Behavioral: Negative for confusion, depression and sleep disturbance. The patient is not nervous/anxious.     PHYSICAL EXAMINATION:  Blood pressure 108/66, pulse 97, temperature 97.9 F (36.6 C), temperature source Tympanic, resp. rate 17, height 5' 2.5" (1.588 m), weight 105 lb (47.6 kg), SpO2 97  %.  ECOG PERFORMANCE STATUS: 1 - Symptomatic but completely ambulatory  Physical Exam  Constitutional: Oriented to person, place, and time and cachetic appearing female and in no distress.  HENT:  Head: Normocephalic and atraumatic. neurofibromatosis associated lesions. Mouth/Throat: Oropharynx is clear and moist. No oropharyngeal exudate.  Eyes: Conjunctivae are normal. Right eye exhibits no discharge. Left eye exhibits no discharge. No scleral icterus.  Neck: Normal range of motion. Neck supple.  Cardiovascular: Normal rate, regular rhythm, normal heart sounds and intact distal pulses.   Pulmonary/Chest: Effort normal and breath sounds normal. No respiratory distress. No wheezes. No rales.  Abdominal: Soft. Bowel sounds are normal. Exhibits no distension and no mass. There is no tenderness.  Musculoskeletal: Normal range of motion. Exhibits no edema.  Lymphadenopathy:    No cervical adenopathy.  Neurological: Alert and oriented to person, place, and time. Exhibits normal muscle tone. Gait normal. Coordination normal.  Skin: Skin is warm and dry. No rash noted. Not diaphoretic. No erythema. No pallor.  Psychiatric: Mood, memory and judgment normal.  Vitals reviewed.  LABORATORY  DATA: Lab Results  Component Value Date   WBC 18.7 (H) 12/01/2019   HGB 12.8 12/01/2019   HCT 39.4 12/01/2019   MCV 86.4 12/01/2019   PLT 558 (H) 12/01/2019      Chemistry      Component Value Date/Time   NA 138 12/01/2019 1422   K 4.3 12/01/2019 1422   CL 100 12/01/2019 1422   CO2 24 12/01/2019 1422   BUN 14 12/01/2019 1422   CREATININE 0.64 12/01/2019 1422   CREATININE 0.63 07/11/2011 1016      Component Value Date/Time   CALCIUM 9.8 12/01/2019 1422   ALKPHOS 72 12/01/2019 1422   AST 28 12/01/2019 1422   ALT 20 12/01/2019 1422   BILITOT 0.2 (L) 12/01/2019 1422       RADIOGRAPHIC STUDIES:  DG Chest 1 View  Result Date: 11/23/2019 CLINICAL DATA:  Post left thoracentesis EXAM: CHEST  1  VIEW COMPARISON:  Chest CT 11/12/2019 FINDINGS: No pneumothorax following left thoracentesis. Airspace disease in the left lower lobe. Underlying apical emphysematous changes and scarring. Heart is normal size. IMPRESSION: No pneumothorax following thoracentesis. Electronically Signed   By: Rolm Baptise M.D.   On: 11/23/2019 10:25   DG Thoracic Spine W/Swimmers  Result Date: 11/12/2019 CLINICAL DATA:  Left back pain for 2 weeks EXAM: LUMBAR SPINE - COMPLETE 4+ VIEW; THORACIC SPINE - 3 VIEWS COMPARISON:  03/06/2019 FINDINGS: Thoracic spine: Frontal and lateral views of the thoracic spine demonstrate rotatory scoliosis of the thoracolumbar spine, with reversal of normal lordosis at the thoracolumbar junction. No acute or destructive bony lesions. There is mild lower thoracic spondylosis. Incidental note is made of dense left basilar consolidation and possible left effusion, dedicated chest imaging with two view chest x-ray or chest CT is recommended. There is extensive background emphysema and scarring. Lumbar spine: Frontal, bilateral oblique, lateral views of the lumbar spine demonstrate mild rotatory scoliosis, right convex at the thoracolumbar junction. There is extensive multilevel spondylosis greatest at the L1/L2 and L2/L3 levels. No acute fractures. Sacroiliac joints are normal. IMPRESSION: 1. Dense consolidation and possible effusion at the left lung base, superimposed upon severe background emphysema. Dedicated chest imaging with either two-view chest x-ray or chest CT is recommended. 2. Rotatory scoliosis of the thoracolumbar spine. 3. Prominent spondylosis at the thoracolumbar junction. 4. No acute bony abnormality. These results will be called to the ordering clinician or representative by the Radiologist Assistant, and communication documented in the PACS or [employer]. Electronically Signed   By: Randa Ngo M.D.   On: 11/12/2019 00:07   DG Lumbar Spine Complete  Result Date:  11/12/2019 CLINICAL DATA:  Left back pain for 2 weeks EXAM: LUMBAR SPINE - COMPLETE 4+ VIEW; THORACIC SPINE - 3 VIEWS COMPARISON:  03/06/2019 FINDINGS: Thoracic spine: Frontal and lateral views of the thoracic spine demonstrate rotatory scoliosis of the thoracolumbar spine, with reversal of normal lordosis at the thoracolumbar junction. No acute or destructive bony lesions. There is mild lower thoracic spondylosis. Incidental note is made of dense left basilar consolidation and possible left effusion, dedicated chest imaging with two view chest x-ray or chest CT is recommended. There is extensive background emphysema and scarring. Lumbar spine: Frontal, bilateral oblique, lateral views of the lumbar spine demonstrate mild rotatory scoliosis, right convex at the thoracolumbar junction. There is extensive multilevel spondylosis greatest at the L1/L2 and L2/L3 levels. No acute fractures. Sacroiliac joints are normal. IMPRESSION: 1. Dense consolidation and possible effusion at the left lung base, superimposed upon severe  background emphysema. Dedicated chest imaging with either two-view chest x-ray or chest CT is recommended. 2. Rotatory scoliosis of the thoracolumbar spine. 3. Prominent spondylosis at the thoracolumbar junction. 4. No acute bony abnormality. These results will be called to the ordering clinician or representative by the Radiologist Assistant, and communication documented in the PACS or [employer]. Electronically Signed   By: Randa Ngo M.D.   On: 11/12/2019 00:07   CT Chest W Contrast  Result Date: 11/12/2019 CLINICAL DATA:  Abnormal finding on recent x-ray. EXAM: CT CHEST WITH CONTRAST TECHNIQUE: Multidetector CT imaging of the chest was performed during intravenous contrast administration. CONTRAST:  45mL OMNIPAQUE IOHEXOL 300 MG/ML  SOLN COMPARISON:  March 06, 2019 FINDINGS: Cardiovascular: Mediastinum/Nodes: There is marked severity left hilar lymphadenopathy. Thyroid gland,  trachea, and esophagus demonstrate no significant findings. Lungs/Pleura: There is marked severity emphysematous lung disease with stable calcified biapical cysts/bullae noted. Moderate to marked severity atelectasis and/or infiltrate is seen within the left lung base. A 3.1 cm x 4.0 cm heterogeneous low-attenuation mass is seen along the medial aspect of the left lower lobe (axial CT image 97, CT series number 2). An additional 2.1 cm x 1.6 cm low-attenuation soft tissue mass is seen within the posteromedial aspect of the left lower lobe (axial CT images 93 through 103, CT series number 2). A 3.6 cm x 2.2 cm lobulated low-attenuation soft tissue mass is seen along the medial aspect of the left upper lobe, just above the level of the aortic arch. This represents a new finding when compared to the prior study. Adjacent 2.0 cm x 1.4 cm and 1.6 cm x 1.5 cm low-attenuation soft tissue masses are seen along the anteromedial aspect of the left upper lobe (axial CT image 69, CT series number 4). This also represents a new finding. Multiple lobulated pleural based soft tissue masses, of various sizes, are seen along the periphery of the left lung base. This represents a new finding when compared to the prior study. Upper Abdomen: There is a stable 2.7 cm x 2.0 cm low-attenuation left adrenal mass. An adjacent, inferior medial 1.6 cm x 1.7 cm lymph node is noted. This represents a new finding. Musculoskeletal: Multiple chronic anterior left-sided rib fractures are seen. Multilevel degenerative changes are seen throughout the thoracic spine IMPRESSION: 1. Heterogeneous soft tissue masses within the anteromedial and medial aspects of the left upper lobe and left lower lobe consistent with an underlying neoplastic process. 2. Moderate to marked severity atelectasis and/or infiltrate within the left lung base. 3. Multiple new low-attenuation soft tissue masses, of various sizes, along the periphery of the left lung base,  consistent with metastatic disease. 4. Stable low-attenuation left adrenal mass with an adjacent, inferior medial 1.6 cm x 1.7 cm lymph node. 5. Multiple chronic anterior left-sided rib fractures. 6. Marked severity emphysematous lung disease with stable calcified biapical cysts/bullae. Emphysema (ICD10-J43.9). Electronically Signed   By: Virgina Norfolk M.D.   On: 11/12/2019 16:20   CT Angio Chest PE W and/or Wo Contrast  Result Date: 11/24/2019 CLINICAL DATA:  Shortness of breath and tachycardia, thoracentesis yesterday EXAM: CT ANGIOGRAPHY CHEST WITH CONTRAST TECHNIQUE: Multidetector CT imaging of the chest was performed using the standard protocol during bolus administration of intravenous contrast. Multiplanar CT image reconstructions and MIPs were obtained to evaluate the vascular anatomy. CONTRAST:  47mL OMNIPAQUE IOHEXOL 350 MG/ML SOLN COMPARISON:  PET-CT 11/17/2019, radiograph 11/24/2019, CT chest 11/12/2019 FINDINGS: Cardiovascular: Satisfactory opacification the pulmonary arteries to the segmental level. No  pulmonary artery filling defects are identified. Tapering of the pulmonary arteries as they enter the region of the left lower lobe mass and hilar invasion detailed below. Central pulmonary arteries are otherwise normal caliber. The aortic root is suboptimally assessed given cardiac pulsation artifact. Atherosclerotic plaque within the normal caliber aorta. No definite acute luminal abnormality within limitations of this non tailored evaluation. Atherosclerotic calcification at the origins of the great vessels including a shared origin of the left common carotid and brachiocephalic artery. Normal cardiac size. No visible pericardial effusion. For the discernible fat plane between the left ventricular free wall and the adjacent mediastinal mass seen on comparison CT and PET though this can be partially attributable to cardiac motion and volume averaging. Mediastinum/Nodes: There is extensive direct  invasion of a left lower lobe mass extending throughout the soft tissues of the left hilum and mediastinum demonstrating hypermetabolism on the comparison PET-CT compatible with a primary lung neoplasm. Previously FDG avid enlarged AP window lymph nodes measuring up to 2.3 cm (6/71) are similar to mildly increased in size from comparison. No acute abnormality of the trachea. Loss of discernible fat plane between the mediastinal mass and the thoracic esophagus. No concerning thyroid nodules. Lungs/Pleura: Redemonstration the heterogeneous Lea enhancing lesion seen centrally in the left lower lobe and extending into the medial aspect of the lingula/left upper lobe and invading the left hilum and extending along the mediastinal borders. Additional extensive FDG avid pleural nodularity is present as well. There is a persistent moderate size loculated malignant effusion. The central airways, pulmonary arteries appear mildly tapered with truncation of the pulmonary veins with narrowing and segmental occlusions of the central airways the lingula and left lower lobe. Additional consolidative opacity and ground-glass within the aerated portions of the left lower lobe and lingula likely reflect some postobstructive infection or inflammation. There are remote postsurgical changes in the bilateral lung apices with evidence of prior thoracotomy. Findings are on a background of diffuse moderate centrilobular and severe paraseptal emphysematous change. Chronic biapical areas of scarring are noted. Upper Abdomen: Pneumobilia noted in the liver. No other acute upper abdominal abnormality is evident. Musculoskeletal: Evidence of prior bilateral thoracotomy widening of the second interspaces bilaterally. No evidence of direct chest wall invasion at this time. Body wall edema is noted. No evidence of osseous metastatic disease. Review of the MIP images confirms the above findings. IMPRESSION: 1. No evidence of acute pulmonary artery  filling defects. 2. Redemonstration of the heterogeneous enhancing lesion centrally in the left lower lobe and extending into the medial aspect of the lingula/left upper lobe as well as directly invading the left hilum and extending along the mediastinal borders. Findings compatible with primary lung neoplasm similar to slightly increased in size. 3. Redemonstration of the previously FDG avid pleural nodularity with a moderate persistent left malignant effusion with loculation. 4. Previously FDG avid enlarged AP window lymph nodes are similar to mildly increased in size from comparison PET-CT. 5. Left lung lesion with hilar invasion results in some narrowing of the pulmonary arteries, occlusion of the pulmonary veins and segmental occlusions of the central airways of the left lower lobe and lingula. Consolidative opacity and ground-glass within the aerated portions of the left lower lobe and lingula likely reflect some postobstructive infection or inflammation. 6. Evidence of prior bilateral thoracotomy widening of the second interspaces and postsurgical changes in the lung apices. 7. Pneumobilia in the liver, correlate for prior sphincterotomy. 8. Aortic Atherosclerosis (ICD10-I70.0) 9. Emphysema (ICD10-J43.9). Electronically Signed   By:  Lovena Le M.D.   On: 11/24/2019 23:10   MR BRAIN W WO CONTRAST  Result Date: 11/27/2019 CLINICAL DATA:  64 year old female with newly diagnosed lung cancer, favored to be extensive stage small cell on preliminary pathology. Staging. EXAM: MRI HEAD WITHOUT AND WITH CONTRAST TECHNIQUE: Multiplanar, multiecho pulse sequences of the brain and surrounding structures were obtained without and with intravenous contrast. CONTRAST:  7.35mL GADAVIST GADOBUTROL 1 MMOL/ML IV SOLN COMPARISON:  None. FINDINGS: Brain: Small developmental venous anomaly in the left superior frontal gyrus (normal variant - series 12, image 19). Similar small DVA in the right deep gray nuclei (series 12, image  18). Indeterminate although probably vascular related punctate focus of enhancement in the right inferior cerebellum on series 11, image 5. No other abnormal intracranial enhancement identified. No midline shift, mass effect. No dural thickening. Chronic appearing encephalomalacia of the posterior corpus callosum near the splenium (series 5, image 13) which is associated with some nonspecific splenium and other periventricular white matter T2 and FLAIR hyperintensity (series 7, image 13). No associated enhancement. No superimposed cortical encephalomalacia. No chronic cerebral blood products identified. Mild to moderate patchy nonspecific T2 and FLAIR hyperintensity also in the pons. No restricted diffusion to suggest acute infarction. No ventriculomegaly, extra-axial collection or acute intracranial hemorrhage. Cervicomedullary junction and pituitary are within normal limits. Vascular: Major intracranial vascular flow voids are preserved. The major dural venous sinuses are enhancing and appear to be patent. Skull and upper cervical spine: Negative visible cervical spine and spinal cord. Visualized bone marrow signal is within normal limits. Sinuses/Orbits: Postoperative changes to both globes, otherwise negative orbits. Paranasal Visualized paranasal sinuses and mastoids are stable and well pneumatized. Other: Visible internal auditory structures appear normal. No suspicious scalp or face soft tissue lesion. IMPRESSION: 1. No definite metastatic disease to the brain. Several small developmental venous anomalies are noted, and a punctate area of enhancement in the right cerebellum on series 11, image 5 is also felt to be vascular in nature. Recommend repeat Brain MRI without and with contrast in 3 months with attention to this area. 2. Chronic nonspecific white matter signal changes including some chronic encephalomalacia in the posterior corpus callosum. Electronically Signed   By: Genevie Ann M.D.   On: 11/27/2019  15:01   Cardiac event monitor  Result Date: 11/22/2019 Predominant rhythm is normal sinus Runs of NSVT with the longest being 15 beats. Runs of SVT with the longest being 9 beats.  Frequent supraventricular ectopy Infrequent ventricular ectopy.   NM PET Image Restage (PS) Skull Base to Thigh  Result Date: 11/17/2019 CLINICAL DATA:  Initial treatment strategy for left lung mass. EXAM: NUCLEAR MEDICINE PET SKULL BASE TO THIGH TECHNIQUE: 5.62 mCi F-18 FDG was injected intravenously. Full-ring PET imaging was performed from the skull base to thigh after the radiotracer. CT data was obtained and used for attenuation correction and anatomic localization. Fasting blood glucose: 99 mg/dl COMPARISON:  Chest CT 11/12/2019 FINDINGS: Mediastinal blood pool activity: SUV max 2.23 Liver activity: SUV max NA NECK: No hypermetabolic lymph nodes in the neck. Incidental CT findings: none CHEST: Large central left lower lobe mass invading the left hilum and mediastinum is markedly hypermetabolic with SUV max of 50.93. Findings consistent with a primary lung neoplasm. There is also extensive pleural metastatic disease involving the left hemithorax with numerous large hypermetabolic pleural nodules. SUV max in these lesions ranges from 17.3 to 19.8. Associated malignant left pleural effusion. No breast masses, supraclavicular or axillary adenopathy. Severe underlying emphysematous  changes. I do not see any definite metastatic pulmonary nodules in the right lung. Incidental CT findings: Age advanced vascular disease with aortic and coronary artery calcifications. ABDOMEN/PELVIS: Enlarged gastrohepatic ligament lymph node measures 19.5 mm on image 102/4. SUV max is 17.33. There is also a 19 mm left-sided retroperitoneal lymph node on image 109/4 which has an SUV max of 15.36. No findings for hepatic metastatic disease or adrenal gland metastatic disease. Left adrenal gland lesion measures 10 Hounsfield units and is not show  hypermetabolism consistent with a benign adenoma. No metastatic disease involving the pelvis. Incidental CT findings: Aortic calcifications. Evidence of prior bowel surgery. Advanced vascular calcifications. SKELETON: No focal hypermetabolic activity to suggest skeletal metastasis. Incidental CT findings: none IMPRESSION: 1. Large hypermetabolic central left lower lobe mass invading the left hilum and mediastinum and consistent with primary lung neoplasm. There is also extensive left-sided pleural metastatic disease and a malignant left pleural effusion. 2. Malignant upper abdominal lymphadenopathy. No hepatic or adrenal gland metastasis are demonstrated. 3. No findings for metastatic pulmonary nodules in the right lung. 4. No findings suspicious for osseous metastatic disease. Electronically Signed   By: Marijo Sanes M.D.   On: 11/17/2019 15:19   DG Chest Port 1 View  Result Date: 11/24/2019 CLINICAL DATA:  Shortness of breath. EXAM: PORTABLE CHEST 1 VIEW COMPARISON:  November 23, 2019 FINDINGS: Marked severity atelectasis and/or infiltrate is seen within the left lung base. This is increased in severity when compared to the prior study. There is a small left pleural effusion which is also increased in size. No pneumothorax is identified. The heart size and mediastinal contours are within normal limits. A small deformity is seen along the lateral aspect of the upper left chest wall, with subsequent herniation of a small amount of left upper lobe. This is present on the prior study. There is mild dextroscoliosis of the midthoracic spine. IMPRESSION: 1. Marked severity left basilar atelectasis and/or infiltrate, increased in severity when compared to the prior study. 2. Small left pleural effusion which is increased in size. Electronically Signed   By: Virgina Norfolk M.D.   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 Request received for diagnostic and therapeutic left thoracentesis. EXAM: ULTRASOUND GUIDED DIAGNOSTIC AND THERAPEUTIC LEFT THORACENTESIS MEDICATIONS: 1% lidocaine to skin and subcutaneous tissue COMPLICATIONS: None immediate. PROCEDURE: An ultrasound guided thoracentesis was thoroughly discussed with the patient and questions answered. The benefits, risks, alternatives and complications were also discussed. The patient understands and wishes to proceed with the procedure. Written consent was obtained. Ultrasound was performed to localize and mark an adequate pocket of fluid in the left chest. The area was then prepped and draped in the normal sterile fashion. 1% Lidocaine was used for local anesthesia. Under ultrasound guidance a 6 Fr Safe-T-Centesis catheter was introduced. Thoracentesis was performed. The catheter was removed and a dressing applied. FINDINGS: A total of approximately 1.1 liters of slightly hazy, yellow fluid was removed. Samples were sent to the laboratory as requested by the clinical team. IMPRESSION: Successful ultrasound guided diagnostic and therapeutic left thoracentesis yielding 1.1 liters of pleural fluid. Read by: Rowe Robert, PA-C Electronically Signed   By: Jacqulynn Cadet M.D.   On: 11/23/2019 11:13     ASSESSMENT/PLAN:  Extensive stage small cell lung cancer, the patient presented with a  large left central left upper lobe lung mass invading the left hilum and mediastinum with extensive left-sided pleural metastatic disease and a malignant left pleural effusion.  The patient had associated upper abdominal lymphadenopathy.  She was diagnosed in August 2021.  The patient recently had a brain MRI to complete the staging work-up which No definite metastatic disease to the brain. Several small developmental venous anomalies are noted, and a punctate area of  enhancement in the right cerebellum shich also were felt to be vascular in nature. We will repeat imaging with routine imaging assessing for treatment response.   Dr. Julien Nordmann had a lengthly discussion with the patient today about her current condition and treatment options.  Dr. Julien Nordmann rediscussed the recommended treatment which is palliative systemic chemotherapy with carboplatin for an AUC of 5 on day 1, etoposide 100 mg per metered squared on days 1, 2, and 3, and Imfinzi 1500 mg IV on day 1.  The patient will also use colsela for myeloprotection.  She is expected to start her first dose of this treatment on 12/02/19.  We discussed the adverse side effects of treatment including but not limited to alopecia, myelosuppression, nausea and vomiting, peripheral neuropathy, liver or renal dysfunction as well as immunotherapy mediated adverse effects.   I will arrange for the patient to have a chemoeducation class prior to receiving her first cycle of chemotherapy. This will be performed today.    She has EMLA cream for her port-a-cath insertion which we rescheduled for next week due to conflicting appointment times with her chemotherapy.   The patient will follow-up in 1 weeks for a one-week follow-up visit after completing her first cycle of chemotherapy.  I have sent a refill of norco for pain management.   The patient was advised to call immediately if she has any concerning symptoms in the interval. The patient voices understanding of current disease status and treatment options and is in agreement with the current care plan. All questions were answered. The patient knows to call the clinic with any problems, questions or concerns. We can certainly see the patient much sooner if necessary  No orders of the defined types were placed in this encounter.    Azusena Erlandson L Cella Cappello, PA-C 12/01/19  ADDENDUM: Hematology/Oncology Attending: I had a face-to-face encounter with the patient today.   I recommended her care plan.  This is a very pleasant 64 years old white female recently diagnosed with extensive stage small cell lung cancer in August 2021 and presented with large left central upper lobe lung mass in addition to invasion of the left hilum and mediastinum with extensive left-sided pleural metastasis and malignant left pleural effusion and abdominal lymphadenopathy. The patient was seen in the hospital last week for evaluation. She had MRI of the brain performed recently that showed no clear evidence for disease metastasis but there was an area suspicious for vascular anomaly that need to be monitored closely to rule out early metastasis. I recommended for the patient to proceed with the first cycle of her systemic chemotherapy tomorrow as planned. She will have a Port-A-Cath placed later in the week to avoid any conflict with the chemotherapy schedule. For pain management we will give her a refill of Norco for now. The patient will come back for follow-up visit in 1 week for evaluation and management of any adverse effect of her treatment. She was advised to call immediately if she has any concerning symptoms in the interval.  Disclaimer: This note was dictated with  voice recognition software. Similar sounding words can inadvertently be transcribed and may be missed upon review. Eilleen Kempf, MD 12/01/19

## 2019-12-01 ENCOUNTER — Inpatient Hospital Stay: Payer: PPO

## 2019-12-01 ENCOUNTER — Other Ambulatory Visit: Payer: Self-pay

## 2019-12-01 ENCOUNTER — Other Ambulatory Visit: Payer: Self-pay | Admitting: Student

## 2019-12-01 ENCOUNTER — Inpatient Hospital Stay: Payer: PPO | Attending: Physician Assistant | Admitting: Physician Assistant

## 2019-12-01 ENCOUNTER — Encounter: Payer: Self-pay | Admitting: Physician Assistant

## 2019-12-01 VITALS — BP 108/66 | HR 97 | Temp 97.9°F | Resp 17 | Ht 62.5 in | Wt 105.0 lb

## 2019-12-01 DIAGNOSIS — C781 Secondary malignant neoplasm of mediastinum: Secondary | ICD-10-CM | POA: Insufficient documentation

## 2019-12-01 DIAGNOSIS — C3412 Malignant neoplasm of upper lobe, left bronchus or lung: Secondary | ICD-10-CM | POA: Diagnosis not present

## 2019-12-01 DIAGNOSIS — C782 Secondary malignant neoplasm of pleura: Secondary | ICD-10-CM | POA: Diagnosis not present

## 2019-12-01 DIAGNOSIS — Z5111 Encounter for antineoplastic chemotherapy: Secondary | ICD-10-CM | POA: Diagnosis not present

## 2019-12-01 DIAGNOSIS — Z79899 Other long term (current) drug therapy: Secondary | ICD-10-CM | POA: Insufficient documentation

## 2019-12-01 DIAGNOSIS — C170 Malignant neoplasm of duodenum: Secondary | ICD-10-CM

## 2019-12-01 DIAGNOSIS — C3492 Malignant neoplasm of unspecified part of left bronchus or lung: Secondary | ICD-10-CM | POA: Diagnosis not present

## 2019-12-01 DIAGNOSIS — J439 Emphysema, unspecified: Secondary | ICD-10-CM | POA: Diagnosis not present

## 2019-12-01 DIAGNOSIS — R590 Localized enlarged lymph nodes: Secondary | ICD-10-CM | POA: Diagnosis not present

## 2019-12-01 DIAGNOSIS — J910 Malignant pleural effusion: Secondary | ICD-10-CM | POA: Diagnosis not present

## 2019-12-01 LAB — CMP (CANCER CENTER ONLY)
ALT: 20 U/L (ref 0–44)
AST: 28 U/L (ref 15–41)
Albumin: 2.8 g/dL — ABNORMAL LOW (ref 3.5–5.0)
Alkaline Phosphatase: 72 U/L (ref 38–126)
Anion gap: 14 (ref 5–15)
BUN: 14 mg/dL (ref 8–23)
CO2: 24 mmol/L (ref 22–32)
Calcium: 9.8 mg/dL (ref 8.9–10.3)
Chloride: 100 mmol/L (ref 98–111)
Creatinine: 0.64 mg/dL (ref 0.44–1.00)
GFR, Est AFR Am: >60 mL/min
GFR, Estimated: >60 mL/min
Glucose, Bld: 102 mg/dL — ABNORMAL HIGH (ref 70–99)
Potassium: 4.3 mmol/L (ref 3.5–5.1)
Sodium: 138 mmol/L (ref 135–145)
Total Bilirubin: 0.2 mg/dL — ABNORMAL LOW (ref 0.3–1.2)
Total Protein: 7.3 g/dL (ref 6.5–8.1)

## 2019-12-01 LAB — CBC WITH DIFFERENTIAL (CANCER CENTER ONLY)
Abs Immature Granulocytes: 0.06 10*3/uL (ref 0.00–0.07)
Basophils Absolute: 0.1 10*3/uL (ref 0.0–0.1)
Basophils Relative: 0 %
Eosinophils Absolute: 0 10*3/uL (ref 0.0–0.5)
Eosinophils Relative: 0 %
HCT: 39.4 % (ref 36.0–46.0)
Hemoglobin: 12.8 g/dL (ref 12.0–15.0)
Immature Granulocytes: 0 %
Lymphocytes Relative: 11 %
Lymphs Abs: 2.1 10*3/uL (ref 0.7–4.0)
MCH: 28.1 pg (ref 26.0–34.0)
MCHC: 32.5 g/dL (ref 30.0–36.0)
MCV: 86.4 fL (ref 80.0–100.0)
Monocytes Absolute: 1.7 10*3/uL — ABNORMAL HIGH (ref 0.1–1.0)
Monocytes Relative: 9 %
Neutro Abs: 14.7 10*3/uL — ABNORMAL HIGH (ref 1.7–7.7)
Neutrophils Relative %: 80 %
Platelet Count: 558 10*3/uL — ABNORMAL HIGH (ref 150–400)
RBC: 4.56 MIL/uL (ref 3.87–5.11)
RDW: 14.1 % (ref 11.5–15.5)
WBC Count: 18.7 10*3/uL — ABNORMAL HIGH (ref 4.0–10.5)
nRBC: 0 % (ref 0.0–0.2)

## 2019-12-01 LAB — TSH: TSH: 5.068 u[IU]/mL — ABNORMAL HIGH (ref 0.308–3.960)

## 2019-12-01 MED ORDER — HYDROCODONE-ACETAMINOPHEN 5-325 MG PO TABS: 1.0000 | ORAL_TABLET | Freq: Four times a day (QID) | ORAL | 0 refills | Status: DC | PRN

## 2019-12-01 NOTE — Telephone Encounter (Signed)
Send her my well wishes.  She does not need a follow up at this point unless she has significant symptomatic palpitations.

## 2019-12-01 NOTE — Progress Notes (Signed)
Called and rescheduled pts port placement at [facility] it is at 7:30am, pt is to be NPO, and have a driver come with a driver. Pt made aware of appointment date, time, instructions, and location at doctor visit today.

## 2019-12-01 NOTE — Telephone Encounter (Signed)
Advised patient

## 2019-12-01 NOTE — Progress Notes (Signed)
Pharmacist Chemotherapy Monitoring - Initial Assessment    Anticipated start date: 12/02/19  Regimen:  . Are orders appropriate based on the patient's diagnosis, regimen, and cycle? Yes . Does the plan date match the patient's scheduled date? Yes . Is the sequencing of drugs appropriate? Yes . Are the premedications appropriate for the patient's regimen? Yes . Prior Authorization for treatment is: Approved o If applicable, is the correct biosimilar selected based on the patient's insurance?   Organ Function and Labs: Marland Kitchen Are dose adjustments needed based on the patient's renal function, hepatic function, or hematologic function? No . Are appropriate labs ordered prior to the start of patient's treatment? Yes . Other organ system assessment, if indicated: N/A . The following baseline labs, if indicated, have been ordered: N/A  Dose Assessment: . Are the drug doses appropriate? Yes . Are the following correct: o Drug concentrations Yes o IV fluid compatible with drug Yes o Administration routes Yes o Timing of therapy Yes . If applicable, does the patient have documented access for treatment and/or plans for port-a-cath placement? yes . If applicable, have lifetime cumulative doses been properly documented and assessed? yes Lifetime Dose Tracking  No doses have been documented on this patient for the following tracked chemicals: Doxorubicin, Epirubicin, Idarubicin, Daunorubicin, Mitoxantrone, Bleomycin, Oxaliplatin, Carboplatin, Liposomal Doxorubicin  o   Toxicity Monitoring/Prevention: . The patient has the following take home antiemetics prescribed: Prochlorperazine . The patient has the following take home medications prescribed: N/A . Medication allergies and previous infusion related reactions, if applicable, have been reviewed and addressed. Yes . The patient's current medication list has been assessed for no significant drug-drug interactions were identified on review.  Order  Review: . Are the treatment plan orders signed? Yes . Is the patient scheduled to see a provider prior to their treatment? No  I verify that I have reviewed each item in the above checklist and answered each question accordingly.  Romualdo Bolk [facility] 12/01/2019 12:56 PM

## 2019-12-01 NOTE — Patient Instructions (Signed)
-  There are two main categories of lung cancer, they are named based on the size of the cancer cell. One is called Non-Small cell lung cancer. The other type is Small Cell Lung Cancer -The sample (biopsy) that they took of your tumor was consistent with Small Cell Lung Cancer -We covered a lot of important information at your appointment today regarding what the treatment plan is moving forward. Here are the the main points that were discussed at your office visit with [place] today:  -The treatment that you will receive consists of Two chemotherapy drugs, called Carboplatin and Etoposide and One Immunotherapy drug called Imfinzi (Durvalumab)  -We are planning on starting your treatment on 12/02/19 but before your start your treatment, I would like you to attend a Chemotherapy Education Class. This involves having you sit down with one of our nurse educators. She will discuss with your one-on-one more details about your treatment as well as general information about resources here at the [facility] treatment will be given for three consecutive days every 3 weeks. You would need to come back for an injection 2 days after your 3rd day of treatment.  We will check your labs once a week for the first ~4 treatments just to make sure that important components of your blood are in an acceptable range -We will get a CT scan after 2 treatments to check on the progress of treatment  Side Effects:  -The adverse effect of this treatment including but not limited to alopecia (losing your hair), myelosuppression (drops in the blood counts), nausea and vomiting, peripheral neuropathy (numbness and tingling in the hands and feet), liver or renal dysfunction.    Follow up:  -We will see you back for a follow up visit in 1 weeks.

## 2019-12-02 ENCOUNTER — Other Ambulatory Visit: Payer: Self-pay

## 2019-12-02 ENCOUNTER — Ambulatory Visit (HOSPITAL_COMMUNITY): Admission: RE | Admit: 2019-12-02 | Payer: PPO | Source: Ambulatory Visit

## 2019-12-02 ENCOUNTER — Encounter: Payer: Self-pay | Admitting: Internal Medicine

## 2019-12-02 ENCOUNTER — Inpatient Hospital Stay: Payer: PPO

## 2019-12-02 VITALS — BP 123/56 | HR 94 | Temp 97.8°F | Resp 20 | Wt 119.5 lb

## 2019-12-02 DIAGNOSIS — Z5111 Encounter for antineoplastic chemotherapy: Secondary | ICD-10-CM | POA: Diagnosis not present

## 2019-12-02 DIAGNOSIS — C3492 Malignant neoplasm of unspecified part of left bronchus or lung: Secondary | ICD-10-CM

## 2019-12-02 MED ORDER — SODIUM CHLORIDE 0.9 % IV SOLN
150.0000 mg | Freq: Once | INTRAVENOUS | Status: AC
  Administered 2019-12-02: 150 mg via INTRAVENOUS
  Filled 2019-12-02: qty 150

## 2019-12-02 MED ORDER — SODIUM CHLORIDE 0.9 % IV SOLN
1500.0000 mg | Freq: Once | INTRAVENOUS | Status: AC
  Administered 2019-12-02: 1500 mg via INTRAVENOUS
  Filled 2019-12-02: qty 30

## 2019-12-02 MED ORDER — SODIUM CHLORIDE 0.9 % IV SOLN
370.0000 mg | Freq: Once | INTRAVENOUS | Status: AC
  Administered 2019-12-02: 370 mg via INTRAVENOUS
  Filled 2019-12-02: qty 37

## 2019-12-02 MED ORDER — SODIUM CHLORIDE 0.9 % IV SOLN
Freq: Once | INTRAVENOUS | Status: AC
  Filled 2019-12-02: qty 250

## 2019-12-02 MED ORDER — SODIUM CHLORIDE 0.9 % IV SOLN
160.0000 mg | Freq: Once | INTRAVENOUS | Status: AC
  Administered 2019-12-02: 160 mg via INTRAVENOUS
  Filled 2019-12-02: qty 8

## 2019-12-02 MED ORDER — PALONOSETRON HCL INJECTION 0.25 MG/5ML
INTRAVENOUS | Status: AC
  Filled 2019-12-02: qty 5

## 2019-12-02 MED ORDER — SODIUM CHLORIDE 0.9 % IV SOLN
10.0000 mg | Freq: Once | INTRAVENOUS | Status: AC
  Administered 2019-12-02: 10 mg via INTRAVENOUS
  Filled 2019-12-02: qty 10

## 2019-12-02 MED ORDER — TRILACICLIB DIHYDROCHLORIDE INJECTION 300 MG
375.0000 mg | Freq: Once | INTRAVENOUS | Status: AC
  Administered 2019-12-02: 375 mg via INTRAVENOUS
  Filled 2019-12-02: qty 25

## 2019-12-02 MED ORDER — PALONOSETRON HCL INJECTION 0.25 MG/5ML
0.2500 mg | Freq: Once | INTRAVENOUS | Status: AC
  Administered 2019-12-02: 0.25 mg via INTRAVENOUS

## 2019-12-02 NOTE — Patient Instructions (Signed)
[facility] Discharge Instructions for Patients Receiving Chemotherapy  Today you received the following immunotherapy agent - Imfinzi.  And chemotherapy agents - Carboplatin and Etoposide  To help prevent nausea and vomiting after your treatment, we encourage you to take your nausea medication as prescribed.   If you develop nausea and vomiting that is not controlled by your nausea medication, call the clinic.   BELOW ARE SYMPTOMS THAT SHOULD BE REPORTED IMMEDIATELY:  *FEVER GREATER THAN 100.5 F  *CHILLS WITH OR WITHOUT FEVER  NAUSEA AND VOMITING THAT IS NOT CONTROLLED WITH YOUR NAUSEA MEDICATION  *UNUSUAL SHORTNESS OF BREATH  *UNUSUAL BRUISING OR BLEEDING  TENDERNESS IN MOUTH AND THROAT WITH OR WITHOUT PRESENCE OF ULCERS  *URINARY PROBLEMS  *BOWEL PROBLEMS  UNUSUAL RASH Items with * indicate a potential emergency and should be followed up as soon as possible.  Feel free to call the clinic should you have any questions or concerns. The clinic phone number is [phone].  Please show the [facility] at check-in to the Emergency Department and triage nurse.  Durvalumab injection What is this medicine? DURVALUMAB (dur VAL ue mab) is a monoclonal antibody. It is used to treat urothelial cancer and lung cancer. This medicine may be used for other purposes; ask your health care provider or pharmacist if you have questions. COMMON BRAND NAME(S): IMFINZI What should I tell my health care provider before I take this medicine? They need to know if you have any of these conditions:  diabetes  immune system problems  infection  inflammatory bowel disease  kidney disease  liver disease  lung or breathing disease  lupus  organ transplant  stomach or intestine problems  thyroid disease  an unusual or allergic reaction to durvalumab, other medicines, foods, dyes, or preservatives  pregnant or trying to get pregnant  breast-feeding How  should I use this medicine? This medicine is for infusion into a vein. It is given by a health care professional in a hospital or clinic setting. A special MedGuide will be given to you before each treatment. Be sure to read this information carefully each time. Talk to your pediatrician regarding the use of this medicine in children. Special care may be needed. Overdosage: If you think you have taken too much of this medicine contact a poison control center or emergency room at once. NOTE: This medicine is only for you. Do not share this medicine with others. What if I miss a dose? It is important not to miss your dose. Call your doctor or health care professional if you are unable to keep an appointment. What may interact with this medicine? Interactions have not been studied. This list may not describe all possible interactions. Give your health care provider a list of all the medicines, herbs, non-prescription drugs, or dietary supplements you use. Also tell them if you smoke, drink alcohol, or use illegal drugs. Some items may interact with your medicine. What should I watch for while using this medicine? This drug may make you feel generally unwell. Continue your course of treatment even though you feel ill unless your doctor tells you to stop. You may need blood work done while you are taking this medicine. Do not become pregnant while taking this medicine or for 3 months after stopping it. Women should inform their doctor if they wish to become pregnant or think they might be pregnant. There is a potential for serious side effects to an unborn child. Talk to your health care  professional or pharmacist for more information. Do not breast-feed an infant while taking this medicine or for 3 months after stopping it. What side effects may I notice from receiving this medicine? Side effects that you should report to your doctor or health care professional as soon as possible:  allergic reactions  like skin rash, itching or hives, swelling of the face, lips, or tongue  black, tarry stools  bloody or watery diarrhea  breathing problems  change in emotions or moods  change in sex drive  changes in vision  chest pain or chest tightness  chills  confusion  cough  facial flushing  fever  headache  signs and symptoms of high blood sugar such as dizziness; dry mouth; dry skin; fruity breath; nausea; stomach pain; increased hunger or thirst; increased urination  signs and symptoms of liver injury like dark yellow or brown urine; general ill feeling or flu-like symptoms; light-colored stools; loss of appetite; nausea; right upper belly pain; unusually weak or tired; yellowing of the eyes or skin  stomach pain  trouble passing urine or change in the amount of urine  weight gain or weight loss Side effects that usually do not require medical attention (report these to your doctor or health care professional if they continue or are bothersome):  bone pain  constipation  loss of appetite  muscle pain  nausea  swelling of the ankles, feet, hands  tiredness This list may not describe all possible side effects. Call your doctor for medical advice about side effects. You may report side effects to FDA at 1-800-FDA-1088. Where should I keep my medicine? This drug is given in a hospital or clinic and will not be stored at home. NOTE: This sheet is a summary. It may not cover all possible information. If you have questions about this medicine, talk to your doctor, pharmacist, or health care provider.  2020 Elsevier/Gold Standard (2016-06-19 19:25:04)  Carboplatin injection What is this medicine? CARBOPLATIN (KAR boe pla tin) is a chemotherapy drug. It targets fast dividing cells, like cancer cells, and causes these cells to die. This medicine is used to treat ovarian cancer and many other cancers. This medicine may be used for other purposes; ask your health care  provider or pharmacist if you have questions. COMMON BRAND NAME(S): Paraplatin What should I tell my health care provider before I take this medicine? They need to know if you have any of these conditions:  blood disorders  hearing problems  kidney disease  recent or ongoing radiation therapy  an unusual or allergic reaction to carboplatin, cisplatin, other chemotherapy, other medicines, foods, dyes, or preservatives  pregnant or trying to get pregnant  breast-feeding How should I use this medicine? This drug is usually given as an infusion into a vein. It is administered in a hospital or clinic by a specially trained health care professional. Talk to your pediatrician regarding the use of this medicine in children. Special care may be needed. Overdosage: If you think you have taken too much of this medicine contact a poison control center or emergency room at once. NOTE: This medicine is only for you. Do not share this medicine with others. What if I miss a dose? It is important not to miss a dose. Call your doctor or health care professional if you are unable to keep an appointment. What may interact with this medicine?  medicines for seizures  medicines to increase blood counts like filgrastim, pegfilgrastim, sargramostim  some antibiotics like amikacin, gentamicin, neomycin,  streptomycin, tobramycin  vaccines Talk to your doctor or health care professional before taking any of these medicines:  acetaminophen  aspirin  ibuprofen  ketoprofen  naproxen This list may not describe all possible interactions. Give your health care provider a list of all the medicines, herbs, non-prescription drugs, or dietary supplements you use. Also tell them if you smoke, drink alcohol, or use illegal drugs. Some items may interact with your medicine. What should I watch for while using this medicine? Your condition will be monitored carefully while you are receiving this medicine. You  will need important blood work done while you are taking this medicine. This drug may make you feel generally unwell. This is not uncommon, as chemotherapy can affect healthy cells as well as cancer cells. Report any side effects. Continue your course of treatment even though you feel ill unless your doctor tells you to stop. In some cases, you may be given additional medicines to help with side effects. Follow all directions for their use. Call your doctor or health care professional for advice if you get a fever, chills or sore throat, or other symptoms of a cold or flu. Do not treat yourself. This drug decreases your body's ability to fight infections. Try to avoid being around people who are sick. This medicine may increase your risk to bruise or bleed. Call your doctor or health care professional if you notice any unusual bleeding. Be careful brushing and flossing your teeth or using a toothpick because you may get an infection or bleed more easily. If you have any dental work done, tell your dentist you are receiving this medicine. Avoid taking products that contain aspirin, acetaminophen, ibuprofen, naproxen, or ketoprofen unless instructed by your doctor. These medicines may hide a fever. Do not become pregnant while taking this medicine. Women should inform their doctor if they wish to become pregnant or think they might be pregnant. There is a potential for serious side effects to an unborn child. Talk to your health care professional or pharmacist for more information. Do not breast-feed an infant while taking this medicine. What side effects may I notice from receiving this medicine? Side effects that you should report to your doctor or health care professional as soon as possible:  allergic reactions like skin rash, itching or hives, swelling of the face, lips, or tongue  signs of infection - fever or chills, cough, sore throat, pain or difficulty passing urine  signs of decreased  platelets or bleeding - bruising, pinpoint red spots on the skin, black, tarry stools, nosebleeds  signs of decreased red blood cells - unusually weak or tired, fainting spells, lightheadedness  breathing problems  changes in hearing  changes in vision  chest pain  high blood pressure  low blood counts - This drug may decrease the number of white blood cells, red blood cells and platelets. You may be at increased risk for infections and bleeding.  nausea and vomiting  pain, swelling, redness or irritation at the injection site  pain, tingling, numbness in the hands or feet  problems with balance, talking, walking  trouble passing urine or change in the amount of urine Side effects that usually do not require medical attention (report to your doctor or health care professional if they continue or are bothersome):  hair loss  loss of appetite  metallic taste in the mouth or changes in taste This list may not describe all possible side effects. Call your doctor for medical advice about side effects. You  may report side effects to FDA at 1-800-FDA-1088. Where should I keep my medicine? This drug is given in a hospital or clinic and will not be stored at home. NOTE: This sheet is a summary. It may not cover all possible information. If you have questions about this medicine, talk to your doctor, pharmacist, or health care provider.  2020 Elsevier/Gold Standard (2007-07-15 14:38:05)   Etoposide, VP-16 injection What is this medicine? ETOPOSIDE, VP-16 (e toe POE side) is a chemotherapy drug. It is used to treat testicular cancer, lung cancer, and other cancers. This medicine may be used for other purposes; ask your health care provider or pharmacist if you have questions. COMMON BRAND NAME(S): Etopophos, Toposar, VePesid What should I tell my health care provider before I take this medicine? They need to know if you have any of these conditions:  infection  kidney  disease  liver disease  low blood counts, like low white cell, platelet, or red cell counts  an unusual or allergic reaction to etoposide, other medicines, foods, dyes, or preservatives  pregnant or trying to get pregnant  breast-feeding How should I use this medicine? This medicine is for infusion into a vein. It is administered in a hospital or clinic by a specially trained health care professional. Talk to your pediatrician regarding the use of this medicine in children. Special care may be needed. Overdosage: If you think you have taken too much of this medicine contact a poison control center or emergency room at once. NOTE: This medicine is only for you. Do not share this medicine with others. What if I miss a dose? It is important not to miss your dose. Call your doctor or health care professional if you are unable to keep an appointment. What may interact with this medicine? This medicine may interact with the following medications:  warfarin This list may not describe all possible interactions. Give your health care provider a list of all the medicines, herbs, non-prescription drugs, or dietary supplements you use. Also tell them if you smoke, drink alcohol, or use illegal drugs. Some items may interact with your medicine. What should I watch for while using this medicine? Visit your doctor for checks on your progress. This drug may make you feel generally unwell. This is not uncommon, as chemotherapy can affect healthy cells as well as cancer cells. Report any side effects. Continue your course of treatment even though you feel ill unless your doctor tells you to stop. In some cases, you may be given additional medicines to help with side effects. Follow all directions for their use. Call your doctor or health care professional for advice if you get a fever, chills or sore throat, or other symptoms of a cold or flu. Do not treat yourself. This drug decreases your body's ability to  fight infections. Try to avoid being around people who are sick. This medicine may increase your risk to bruise or bleed. Call your doctor or health care professional if you notice any unusual bleeding. Talk to your doctor about your risk of cancer. You may be more at risk for certain types of cancers if you take this medicine. Do not become pregnant while taking this medicine or for at least 6 months after stopping it. Women should inform their doctor if they wish to become pregnant or think they might be pregnant. Women of child-bearing potential will need to have a negative pregnancy test before starting this medicine. There is a potential for serious side effects to  an unborn child. Talk to your health care professional or pharmacist for more information. Do not breast-feed an infant while taking this medicine. Men must use a latex condom during sexual contact with a woman while taking this medicine and for at least 4 months after stopping it. A latex condom is needed even if you have had a vasectomy. Contact your doctor right away if your partner becomes pregnant. Do not donate sperm while taking this medicine and for at least 4 months after you stop taking this medicine. Men should inform their doctors if they wish to father a child. This medicine may lower sperm counts. What side effects may I notice from receiving this medicine? Side effects that you should report to your doctor or health care professional as soon as possible:  allergic reactions like skin rash, itching or hives, swelling of the face, lips, or tongue  low blood counts - this medicine may decrease the number of white blood cells, red blood cells, and platelets. You may be at increased risk for infections and bleeding  nausea, vomiting  redness, blistering, peeling or loosening of the skin, including inside the mouth  signs and symptoms of infection like fever; chills; cough; sore throat; pain or trouble passing urine  signs  and symptoms of low red blood cells or anemia such as unusually weak or tired; feeling faint or lightheaded; falls; breathing problems  unusual bruising or bleeding Side effects that usually do not require medical attention (report to your doctor or health care professional if they continue or are bothersome):  changes in taste  diarrhea  hair loss  loss of appetite  mouth sores This list may not describe all possible side effects. Call your doctor for medical advice about side effects. You may report side effects to FDA at 1-800-FDA-1088. Where should I keep my medicine? This drug is given in a hospital or clinic and will not be stored at home. NOTE: This sheet is a summary. It may not cover all possible information. If you have questions about this medicine, talk to your doctor, pharmacist, or health care provider.  2020 Elsevier/Gold Standard (2018-06-04 16:57:15)

## 2019-12-02 NOTE — Progress Notes (Signed)
12/02/19  RN confirmed weight today of 54.2 kg - Treatment plan entered at 79.2 kg from 11/25/19 vital record.  Doses modified to new current weight due to >10% weight loss.  Henreitta Leber, PharmD

## 2019-12-03 ENCOUNTER — Other Ambulatory Visit: Payer: Self-pay

## 2019-12-03 ENCOUNTER — Inpatient Hospital Stay: Payer: PPO

## 2019-12-03 VITALS — BP 151/70 | HR 94 | Temp 97.8°F | Resp 18

## 2019-12-03 DIAGNOSIS — C3492 Malignant neoplasm of unspecified part of left bronchus or lung: Secondary | ICD-10-CM

## 2019-12-03 DIAGNOSIS — Z5111 Encounter for antineoplastic chemotherapy: Secondary | ICD-10-CM | POA: Diagnosis not present

## 2019-12-03 MED ORDER — SODIUM CHLORIDE 0.9 % IV SOLN
100.0000 mg/m2 | Freq: Once | INTRAVENOUS | Status: AC
  Administered 2019-12-03: 160 mg via INTRAVENOUS
  Filled 2019-12-03: qty 8

## 2019-12-03 MED ORDER — SODIUM CHLORIDE 0.9 % IV SOLN
10.0000 mg | Freq: Once | INTRAVENOUS | Status: AC
  Administered 2019-12-03: 10 mg via INTRAVENOUS
  Filled 2019-12-03: qty 10

## 2019-12-03 MED ORDER — SODIUM CHLORIDE 0.9 % IV SOLN
Freq: Once | INTRAVENOUS | Status: AC
  Filled 2019-12-03: qty 250

## 2019-12-03 NOTE — Patient Instructions (Signed)
[facility] Discharge Instructions for Patients Receiving Chemotherapy  Today you received the following immunotherapy agent: Etoposide  To help prevent nausea and vomiting after your treatment, we encourage you to take your nausea medication as prescribed.   If you develop nausea and vomiting that is not controlled by your nausea medication, call the clinic.   BELOW ARE SYMPTOMS THAT SHOULD BE REPORTED IMMEDIATELY:  *FEVER GREATER THAN 100.5 F  *CHILLS WITH OR WITHOUT FEVER  NAUSEA AND VOMITING THAT IS NOT CONTROLLED WITH YOUR NAUSEA MEDICATION  *UNUSUAL SHORTNESS OF BREATH  *UNUSUAL BRUISING OR BLEEDING  TENDERNESS IN MOUTH AND THROAT WITH OR WITHOUT PRESENCE OF ULCERS  *URINARY PROBLEMS  *BOWEL PROBLEMS  UNUSUAL RASH Items with * indicate a potential emergency and should be followed up as soon as possible.  Feel free to call the clinic should you have any questions or concerns. The clinic phone number is [phone].  Please show the [facility] at check-in to the Emergency Department and triage nurse.

## 2019-12-04 ENCOUNTER — Other Ambulatory Visit: Payer: Self-pay

## 2019-12-04 ENCOUNTER — Other Ambulatory Visit: Payer: Self-pay | Admitting: Student

## 2019-12-04 ENCOUNTER — Inpatient Hospital Stay: Payer: PPO

## 2019-12-04 VITALS — BP 148/74 | HR 97 | Temp 97.5°F | Resp 22

## 2019-12-04 DIAGNOSIS — C3492 Malignant neoplasm of unspecified part of left bronchus or lung: Secondary | ICD-10-CM

## 2019-12-04 DIAGNOSIS — Z5111 Encounter for antineoplastic chemotherapy: Secondary | ICD-10-CM | POA: Diagnosis not present

## 2019-12-04 MED ORDER — SODIUM CHLORIDE 0.9 % IV SOLN
10.0000 mg | Freq: Once | INTRAVENOUS | Status: AC
  Administered 2019-12-04: 10 mg via INTRAVENOUS
  Filled 2019-12-04: qty 10

## 2019-12-04 MED ORDER — SODIUM CHLORIDE 0.9 % IV SOLN
Freq: Once | INTRAVENOUS | Status: AC
  Filled 2019-12-04: qty 250

## 2019-12-04 MED ORDER — SODIUM CHLORIDE 0.9 % IV SOLN
100.0000 mg/m2 | Freq: Once | INTRAVENOUS | Status: AC
  Administered 2019-12-04: 160 mg via INTRAVENOUS
  Filled 2019-12-04: qty 8

## 2019-12-04 NOTE — Patient Instructions (Signed)
[facility] Discharge Instructions for Patients Receiving Chemotherapy  Today you received the following chemotherapy agents Etoposide  To help prevent nausea and vomiting after your treatment, we encourage you to take your nausea medication as directed.    If you develop nausea and vomiting that is not controlled by your nausea medication, call the clinic.   BELOW ARE SYMPTOMS THAT SHOULD BE REPORTED IMMEDIATELY:  *FEVER GREATER THAN 100.5 F  *CHILLS WITH OR WITHOUT FEVER  NAUSEA AND VOMITING THAT IS NOT CONTROLLED WITH YOUR NAUSEA MEDICATION  *UNUSUAL SHORTNESS OF BREATH  *UNUSUAL BRUISING OR BLEEDING  TENDERNESS IN MOUTH AND THROAT WITH OR WITHOUT PRESENCE OF ULCERS  *URINARY PROBLEMS  *BOWEL PROBLEMS  UNUSUAL RASH Items with * indicate a potential emergency and should be followed up as soon as possible.  Feel free to call the clinic should you have any questions or concerns. The clinic phone number is [phone].  Please show the [facility] at check-in to the Emergency Department and triage nurse.

## 2019-12-07 ENCOUNTER — Other Ambulatory Visit: Payer: Self-pay

## 2019-12-07 ENCOUNTER — Other Ambulatory Visit: Payer: Self-pay | Admitting: Oncology

## 2019-12-07 ENCOUNTER — Telehealth: Payer: Self-pay | Admitting: Emergency Medicine

## 2019-12-07 ENCOUNTER — Ambulatory Visit (HOSPITAL_COMMUNITY)
Admission: RE | Admit: 2019-12-07 | Discharge: 2019-12-07 | Disposition: A | Discharge status: Home / Self Care | Payer: PPO | Source: Ambulatory Visit | Attending: Oncology | Admitting: Oncology

## 2019-12-07 DIAGNOSIS — I10 Essential (primary) hypertension: Secondary | ICD-10-CM | POA: Diagnosis not present

## 2019-12-07 DIAGNOSIS — Z5111 Encounter for antineoplastic chemotherapy: Secondary | ICD-10-CM | POA: Diagnosis not present

## 2019-12-07 DIAGNOSIS — C3490 Malignant neoplasm of unspecified part of unspecified bronchus or lung: Secondary | ICD-10-CM | POA: Insufficient documentation

## 2019-12-07 DIAGNOSIS — Q8500 Neurofibromatosis, unspecified: Secondary | ICD-10-CM | POA: Insufficient documentation

## 2019-12-07 DIAGNOSIS — J449 Chronic obstructive pulmonary disease, unspecified: Secondary | ICD-10-CM | POA: Diagnosis not present

## 2019-12-07 DIAGNOSIS — C3492 Malignant neoplasm of unspecified part of left bronchus or lung: Secondary | ICD-10-CM | POA: Diagnosis not present

## 2019-12-07 DIAGNOSIS — F17210 Nicotine dependence, cigarettes, uncomplicated: Secondary | ICD-10-CM | POA: Insufficient documentation

## 2019-12-07 DIAGNOSIS — Z9981 Dependence on supplemental oxygen: Secondary | ICD-10-CM | POA: Insufficient documentation

## 2019-12-07 DIAGNOSIS — Z79899 Other long term (current) drug therapy: Secondary | ICD-10-CM | POA: Insufficient documentation

## 2019-12-07 HISTORY — PX: IR IMAGING GUIDED PORT INSERTION: IMG5740

## 2019-12-07 MED ORDER — MIDAZOLAM HCL 2 MG/2ML IJ SOLN
INTRAMUSCULAR | Status: AC | PRN
  Administered 2019-12-07 (×2): 0.5 mg via INTRAVENOUS

## 2019-12-07 MED ORDER — MIDAZOLAM HCL 2 MG/2ML IJ SOLN
INTRAMUSCULAR | Status: AC
  Filled 2019-12-07: qty 2

## 2019-12-07 MED ORDER — CEFAZOLIN SODIUM-DEXTROSE 2-4 GM/100ML-% IV SOLN
2.0000 g | Freq: Once | INTRAVENOUS | Status: AC
  Administered 2019-12-07: 2 g via INTRAVENOUS

## 2019-12-07 MED ORDER — LIDOCAINE-EPINEPHRINE (PF) 1 %-1:200000 IJ SOLN
INTRAMUSCULAR | Status: AC | PRN
  Administered 2019-12-07: 20 mL

## 2019-12-07 MED ORDER — LIDOCAINE-EPINEPHRINE 1 %-1:100000 IJ SOLN
INTRAMUSCULAR | Status: AC
  Filled 2019-12-07: qty 1

## 2019-12-07 MED ORDER — SODIUM CHLORIDE 0.9 % IV SOLN: INTRAVENOUS | Status: DC

## 2019-12-07 MED ORDER — CEFAZOLIN SODIUM-DEXTROSE 2-4 GM/100ML-% IV SOLN: 2.0000 g | Freq: Once | INTRAVENOUS | Status: DC

## 2019-12-07 MED ORDER — FENTANYL CITRATE (PF) 100 MCG/2ML IJ SOLN
INTRAMUSCULAR | Status: AC
  Filled 2019-12-07: qty 2

## 2019-12-07 MED ORDER — FENTANYL CITRATE (PF) 100 MCG/2ML IJ SOLN
INTRAMUSCULAR | Status: AC | PRN
  Administered 2019-12-07: 25 ug via INTRAVENOUS
  Administered 2019-12-07: 12.5 ug via INTRAVENOUS

## 2019-12-07 MED ORDER — CEFAZOLIN SODIUM-DEXTROSE 2-4 GM/100ML-% IV SOLN
INTRAVENOUS | Status: AC
  Filled 2019-12-07: qty 100

## 2019-12-07 MED ORDER — HEPARIN SOD (PORK) LOCK FLUSH 100 UNIT/ML IV SOLN
INTRAVENOUS | Status: AC
  Filled 2019-12-07: qty 5

## 2019-12-07 NOTE — Telephone Encounter (Signed)
Chemo f/u call, spoke with patient.  Pt denies any questions/concerns at this time, aware to f/u as needed.  Aware of appt date/times on 12/09/19 and to not use EMLA cream on port (just placed today) until incision is healed.

## 2019-12-07 NOTE — Procedures (Signed)
Interventional Radiology Procedure Note  Procedure: Placement of a right IJ approach single lumen PowerPort.  Tip is positioned at the superior cavoatrial junction and catheter is ready for immediate use.   Complications: No immediate  EBL: 0 mL  Recommendations:  - Ok to shower tomorrow - Do not submerge for 7 days - Routine line care   Signed,  Criselda Peaches, MD

## 2019-12-07 NOTE — Telephone Encounter (Signed)
-----   Message from Rolene Course, RN sent at 12/02/2019  2:56 PM EDT ----- Regarding: Julien Nordmann 1st Tx F/U call - Imfinzi, Carboplatin, Etoposide Mohamed 1st Tx F/U call - Imfinzi, Carboplatin, Etoposide

## 2019-12-07 NOTE — Discharge Instructions (Signed)
Implanted Port Insertion, Care After °This sheet gives you information about how to care for yourself after your procedure. Your health care provider may also give you more specific instructions. If you have problems or questions, contact your health care provider. °What can I expect after the procedure? °After the procedure, it is common to have: °· Discomfort at the port insertion site. °· Bruising on the skin over the port. This should improve over 3-4 days. °Follow these instructions at home: °Port care °· After your port is placed, you will get a manufacturer's information card. The card has information about your port. Keep this card with you at all times. °· Take care of the port as told by your health care provider. Ask your health care provider if you or a family member can get training for taking care of the port at home. A home health care nurse may also take care of the port. °· Make sure to remember what type of port you have. °Incision care ° °  ° °· Follow instructions from your health care provider about how to take care of your port insertion site. Make sure you: °? Wash your hands with soap and water before and after you change your bandage (dressing). If soap and water are not available, use hand sanitizer. °? Change your dressing as told by your health care provider. °? Leave stitches (sutures), skin glue, or adhesive strips in place. These skin closures may need to stay in place for 2 weeks or longer. If adhesive strip edges start to loosen and curl up, you may trim the loose edges. Do not remove adhesive strips completely unless your health care provider tells you to do that. °· Check your port insertion site every day for signs of infection. Check for: °? Redness, swelling, or pain. °? Fluid or blood. °? Warmth. °? Pus or a bad smell. °Activity °· Return to your normal activities as told by your health care provider. Ask your health care provider what activities are safe for you. °· Do not  lift anything that is heavier than 10 lb (4.5 kg), or the limit that you are told, until your health care provider says that it is safe. °General instructions °· Take over-the-counter and prescription medicines only as told by your health care provider. °· Do not take baths, swim, or use a hot tub until your health care provider approves. Ask your health care provider if you may take showers. You may only be allowed to take sponge baths. °· Do not drive for 24 hours if you were given a sedative during your procedure. °· Wear a medical alert bracelet in case of an emergency. This will tell any health care providers that you have a port. °· Keep all follow-up visits as told by your health care provider. This is important. °Contact a health care provider if: °· You cannot flush your port with saline as directed, or you cannot draw blood from the port. °· You have a fever or chills. °· You have redness, swelling, or pain around your port insertion site. °· You have fluid or blood coming from your port insertion site. °· Your port insertion site feels warm to the touch. °· You have pus or a bad smell coming from the port insertion site. °Get help right away if: °· You have chest pain or shortness of breath. °· You have bleeding from your port that you cannot control. °Summary °· Take care of the port as told by your health   care provider. Keep the manufacturer's information card with you at all times. °· Change your dressing as told by your health care provider. °· Contact a health care provider if you have a fever or chills or if you have redness, swelling, or pain around your port insertion site. °· Keep all follow-up visits as told by your health care provider. °This information is not intended to replace advice given to you by your health care provider. Make sure you discuss any questions you have with your health care provider. °Document Revised: 11/05/2017 Document Reviewed: 11/05/2017 °Elsevier Patient Education ©  2020 [employer] ° °

## 2019-12-07 NOTE — H&P (Signed)
Chief Complaint: Patient was seen in consultation today for tunneled central venous catheter with port placement  Referring Physician(s): Barrington R  Supervising Physician: Jacqulynn Cadet  Patient Status: [facility] - Out-pt  History of Present Illness: [name] [name] is a 64 y.o. female with a past medical history significant for neurofibromatosis type I, HTN, GIST of small intestines s/p resection, COPD  and small cell lung cancer who presents today for tunneled central venous catheter with port placement. [name] [name] presented to her PCP on 11/11/19 with complaints of left sided chest and abdominal pain and underwent a lumbar and thoracic spine x-ray the following day which showed a dense consolidation and possible effusion at the left lung base. A follow up CT chest w/contrast was performed the following day which showed heterogenous soft tissue masses within the anteromedial and medial aspects of the left upper lobe and left lower lobe consistent with neoplastic process, multiple low-attenuation soft tissue masses of various sizes long the periphery of th left lung base consistent with metastatic disease. She was referred to pulmonology and underwent a PET scan on 7/27 which noted a large hypermetabolic central left lower lobe mass invading the left hilum and mediastinum consistent with primary lung neoplasm, extensive left-sided pleural metastatic disease, left pleural effusion and malignant upper abdominal lymphadenopathy. She was referred to oncology who has begun chemotherapy - IR has been asked to place a port for durable venous access.  [name] [name] c/o dyspnea today which is baseline for her, she wear 3L of oxygen at home all the time. She has had a chronic cough which recently began productive of white sputum, she has not noted any blood tinged phlegm. She denies any chest pain, n/v or abdominal pain. She started chemotherapy via IV last week and tolerated it well so far. She states  understanding of the procedure today and is agreeable to proceed as planned.  Past Medical History:  Diagnosis Date  . Allergy   . 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 Carcinoid tumor of abdomen   . Cataract    both eyes  . Family history of adverse reaction to anesthesia    Mother had trouble waking up  . Hypertension   . Lower back pain   . Neurofibromatosis, type I (von Recklinghausen's disease) ([place])   . Pneumothorax on left   . Pneumothorax on right     Past Surgical History:  Procedure Laterality Date  . ABDOMINAL HYSTERECTOMY  04/23/1997   fibroids  TAH/ BSO  . CHOLECYSTECTOMY    . COLONOSCOPY  2006  . EYE SURGERY Left 11/2015   cataract and astigmatism surgery with Intraocular Lens left eye  . LASIK    . LUNG SURGERY  2008?   d/t pneumothroax  . MEMBRANE PEEL Left 08/16/2014   Procedure: MEMBRANE PEEL LEFT EYE ;  Surgeon: Sherlynn Stalls, MD;  Location: [place];  Service: Ophthalmology;  Laterality: Left;  . OOPHORECTOMY    . PARS PLANA VITRECTOMY Left 08/16/2014   Procedure: PARS PLANA VITRECTOMY WITH 25 GAUGE;  Surgeon: Sherlynn Stalls, MD;  Location: [place];  Service: Ophthalmology;  Laterality: Left;  . TONSILLECTOMY    . WHIPPLE PROCEDURE      Allergies: Patient has no known allergies.  Medications: Prior to Admission medications   Medication Sig Start Date End Date Taking? Authorizing Provider  AYR SALINE NASAL NA Place 1 spray into the nose daily as needed (dryness).   Yes [provider]  azithromycin Azucena Fallen)  250 MG tablet Take 1 tablet (250 mg total) by mouth daily. 11/27/19  Yes Danford, Suann Larry, MD  cefdinir (OMNICEF) 300 MG capsule Take 1 capsule (300 mg total) by mouth every 12 (twelve) hours. 11/27/19  Yes Danford, Suann Larry, MD  Cholecalciferol (D3) 50 MCG (2000 UT) TABS Take 2,000 Units by mouth daily with breakfast.    Yes [provider]  metoprolol tartrate (LOPRESSOR) 25 MG tablet Take 0.5 tablets (12.5 mg total) by mouth 2 (two)  times daily. 09/28/19 12/27/19 Yes Minus Breeding, MD  Multiple Vitamins-Minerals (MULTIVITAMINS THER. W/MINERALS) TABS Take 1 tablet by mouth daily.   Yes [provider]  nicotine polacrilex (COMMIT) 4 MG lozenge Take 4 mg by mouth as needed for smoking cessation.   Yes [provider]  traZODone (DESYREL) 50 MG tablet Take 1 tablet (50 mg total) by mouth at bedtime as needed for sleep. Patient taking differently: Take 50 mg by mouth at bedtime.  11/27/19  Yes Danford, Suann Larry, MD  lidocaine-prilocaine (EMLA) cream Apply to the Port-A-Cath site 30-60 minutes before treatment. 11/27/19   Curt Bears, MD  prochlorperazine (COMPAZINE) 10 MG tablet Take 1 tablet (10 mg total) by mouth every 6 (six) hours as needed for nausea or vomiting. 11/27/19   Curt Bears, MD     Family History  Problem Relation Age of Onset  . Cancer Mother   . Cancer Father   . Heart disease Father   . Cancer Sister   . Heart disease Sister   . Dementia Sister   . Diabetes Brother   . Colon cancer Neg Hx     Social History   Socioeconomic History  . Marital status: Single    Spouse name: Not on file  . Number of children: 1  . Years of education: Not on file  . Highest education level: Not on file  Occupational History  . Occupation: retired    Comment: disabled  Tobacco Use  . Smoking status: Current Every Day Smoker    Packs/day: 0.50    Years: 47.00    Pack years: 23.50    Types: Cigarettes  . Smokeless tobacco: Never Used  . Tobacco comment: Down to 6 cigarettes daily, working on decreaseing even more  Substance and Sexual Activity  . Alcohol use: Yes    Alcohol/week: 0.0 standard drinks    Comment: RARE 1-2 drinks year  . Drug use: No  . Sexual activity: Never  Other Topics Concern  . Not on file  Social History Narrative   Single   Adopted daughter   2 Dogs      Exercise-no   Social Determinants of Health   Financial Resource Strain:   . Difficulty of  Paying Living Expenses:   Food Insecurity:   . Worried About Charity fundraiser in the Last Year:   . Arboriculturist in the Last Year:   Transportation Needs:   . Film/video editor (Medical):   Marland Kitchen Lack of Transportation (Non-Medical):   Physical Activity:   . Days of Exercise per Week:   . Minutes of Exercise per Session:   Stress:   . Feeling of Stress :   Social Connections:   . Frequency of Communication with Friends and Family:   . Frequency of Social Gatherings with Friends and Family:   . Attends Religious Services:   . Active Member of Clubs or Organizations:   . Attends Archivist Meetings:   Marland Kitchen Marital Status:  Review of Systems: A 12 point ROS discussed and pertinent positives are indicated in the HPI above.  All other systems are negative.  Review of Systems  Constitutional: Positive for fatigue. Negative for chills and fever.  Respiratory: Positive for cough (productive white sputum) and shortness of breath.   Cardiovascular: Negative for chest pain.  Gastrointestinal: Negative for abdominal pain, blood in stool, diarrhea, nausea and vomiting.  Musculoskeletal: Negative for back pain.  Neurological: Negative for dizziness and headaches.  Psychiatric/Behavioral: Negative for confusion.    Vital Signs: BP 137/72   Pulse (!) 103   Temp (!) 97.5 F (36.4 C) (Oral)   Resp 16   Ht 5' 2.5" (1.588 m)   Wt 115 lb (52.2 kg)   SpO2 91%   BMI 20.70 kg/m   Physical Exam Vitals and nursing note reviewed.  Constitutional:      General: She is in acute distress.     Appearance: She is ill-appearing.     Comments: Able to speak in short sentences due to dyspnea, sitting at 90 degrees  HENT:     Head: Normocephalic.     Mouth/Throat:     Mouth: Mucous membranes are moist.     Pharynx: Oropharynx is clear. No oropharyngeal exudate or posterior oropharyngeal erythema.  Cardiovascular:     Rate and Rhythm: Regular rhythm. Tachycardia present.    Pulmonary:     Comments: (+) decreased breath sounds bilaterally, L>R (+) 3L supplemental oxygen via [place] (+) use of accessory muscles for respiration Abdominal:     General: There is no distension.     Palpations: Abdomen is soft.     Tenderness: There is no abdominal tenderness.  Skin:    General: Skin is warm and dry.  Neurological:     Mental Status: She is alert and oriented to person, place, and time.  Psychiatric:        Mood and Affect: Mood normal.        Behavior: Behavior normal.        Thought Content: Thought content normal.        Judgment: Judgment normal.      MD Evaluation Airway: WNL Heart: WNL Abdomen: WNL Chest/ Lungs: WNL (3L supplemental O2 continuous) ASA  Classification: 3 Mallampati/Airway Score: Two   Imaging: DG Chest 1 View  Result Date: 11/23/2019 CLINICAL DATA:  Post left thoracentesis EXAM: CHEST  1 VIEW COMPARISON:  Chest CT 11/12/2019 FINDINGS: No pneumothorax following left thoracentesis. Airspace disease in the left lower lobe. Underlying apical emphysematous changes and scarring. Heart is normal size. IMPRESSION: No pneumothorax following thoracentesis. Electronically Signed   By: Rolm Baptise M.D.   On: 11/23/2019 10:25   DG Thoracic Spine W/Swimmers  Result Date: 11/12/2019 CLINICAL DATA:  Left back pain for 2 weeks EXAM: LUMBAR SPINE - COMPLETE 4+ VIEW; THORACIC SPINE - 3 VIEWS COMPARISON:  03/06/2019 FINDINGS: Thoracic spine: Frontal and lateral views of the thoracic spine demonstrate rotatory scoliosis of the thoracolumbar spine, with reversal of normal lordosis at the thoracolumbar junction. No acute or destructive bony lesions. There is mild lower thoracic spondylosis. Incidental note is made of dense left basilar consolidation and possible left effusion, dedicated chest imaging with two view chest x-ray or chest CT is recommended. There is extensive background emphysema and scarring. Lumbar spine: Frontal, bilateral oblique, lateral  views of the lumbar spine demonstrate mild rotatory scoliosis, right convex at the thoracolumbar junction. There is extensive multilevel spondylosis greatest at the L1/L2 and L2/L3 levels.  No acute fractures. Sacroiliac joints are normal. IMPRESSION: 1. Dense consolidation and possible effusion at the left lung base, superimposed upon severe background emphysema. Dedicated chest imaging with either two-view chest x-ray or chest CT is recommended. 2. Rotatory scoliosis of the thoracolumbar spine. 3. Prominent spondylosis at the thoracolumbar junction. 4. No acute bony abnormality. These results will be called to the ordering clinician or representative by the Radiologist Assistant, and communication documented in the PACS or [employer]. Electronically Signed   By: Randa Ngo M.D.   On: 11/12/2019 00:07   DG Lumbar Spine Complete  Result Date: 11/12/2019 CLINICAL DATA:  Left back pain for 2 weeks EXAM: LUMBAR SPINE - COMPLETE 4+ VIEW; THORACIC SPINE - 3 VIEWS COMPARISON:  03/06/2019 FINDINGS: Thoracic spine: Frontal and lateral views of the thoracic spine demonstrate rotatory scoliosis of the thoracolumbar spine, with reversal of normal lordosis at the thoracolumbar junction. No acute or destructive bony lesions. There is mild lower thoracic spondylosis. Incidental note is made of dense left basilar consolidation and possible left effusion, dedicated chest imaging with two view chest x-ray or chest CT is recommended. There is extensive background emphysema and scarring. Lumbar spine: Frontal, bilateral oblique, lateral views of the lumbar spine demonstrate mild rotatory scoliosis, right convex at the thoracolumbar junction. There is extensive multilevel spondylosis greatest at the L1/L2 and L2/L3 levels. No acute fractures. Sacroiliac joints are normal. IMPRESSION: 1. Dense consolidation and possible effusion at the left lung base, superimposed upon severe background emphysema. Dedicated chest imaging  with either two-view chest x-ray or chest CT is recommended. 2. Rotatory scoliosis of the thoracolumbar spine. 3. Prominent spondylosis at the thoracolumbar junction. 4. No acute bony abnormality. These results will be called to the ordering clinician or representative by the Radiologist Assistant, and communication documented in the PACS or [employer]. Electronically Signed   By: Randa Ngo M.D.   On: 11/12/2019 00:07   CT Chest W Contrast  Result Date: 11/12/2019 CLINICAL DATA:  Abnormal finding on recent x-ray. EXAM: CT CHEST WITH CONTRAST TECHNIQUE: Multidetector CT imaging of the chest was performed during intravenous contrast administration. CONTRAST:  53mL OMNIPAQUE IOHEXOL 300 MG/ML  SOLN COMPARISON:  March 06, 2019 FINDINGS: Cardiovascular: Mediastinum/Nodes: There is marked severity left hilar lymphadenopathy. Thyroid gland, trachea, and esophagus demonstrate no significant findings. Lungs/Pleura: There is marked severity emphysematous lung disease with stable calcified biapical cysts/bullae noted. Moderate to marked severity atelectasis and/or infiltrate is seen within the left lung base. A 3.1 cm x 4.0 cm heterogeneous low-attenuation mass is seen along the medial aspect of the left lower lobe (axial CT image 97, CT series number 2). An additional 2.1 cm x 1.6 cm low-attenuation soft tissue mass is seen within the posteromedial aspect of the left lower lobe (axial CT images 93 through 103, CT series number 2). A 3.6 cm x 2.2 cm lobulated low-attenuation soft tissue mass is seen along the medial aspect of the left upper lobe, just above the level of the aortic arch. This represents a new finding when compared to the prior study. Adjacent 2.0 cm x 1.4 cm and 1.6 cm x 1.5 cm low-attenuation soft tissue masses are seen along the anteromedial aspect of the left upper lobe (axial CT image 69, CT series number 4). This also represents a new finding. Multiple lobulated pleural based soft tissue  masses, of various sizes, are seen along the periphery of the left lung base. This represents a new finding when compared to the prior study. Upper  Abdomen: There is a stable 2.7 cm x 2.0 cm low-attenuation left adrenal mass. An adjacent, inferior medial 1.6 cm x 1.7 cm lymph node is noted. This represents a new finding. Musculoskeletal: Multiple chronic anterior left-sided rib fractures are seen. Multilevel degenerative changes are seen throughout the thoracic spine IMPRESSION: 1. Heterogeneous soft tissue masses within the anteromedial and medial aspects of the left upper lobe and left lower lobe consistent with an underlying neoplastic process. 2. Moderate to marked severity atelectasis and/or infiltrate within the left lung base. 3. Multiple new low-attenuation soft tissue masses, of various sizes, along the periphery of the left lung base, consistent with metastatic disease. 4. Stable low-attenuation left adrenal mass with an adjacent, inferior medial 1.6 cm x 1.7 cm lymph node. 5. Multiple chronic anterior left-sided rib fractures. 6. Marked severity emphysematous lung disease with stable calcified biapical cysts/bullae. Emphysema (ICD10-J43.9). Electronically Signed   By: Virgina Norfolk M.D.   On: 11/12/2019 16:20   CT Angio Chest PE W and/or Wo Contrast  Result Date: 11/24/2019 CLINICAL DATA:  Shortness of breath and tachycardia, thoracentesis yesterday EXAM: CT ANGIOGRAPHY CHEST WITH CONTRAST TECHNIQUE: Multidetector CT imaging of the chest was performed using the standard protocol during bolus administration of intravenous contrast. Multiplanar CT image reconstructions and MIPs were obtained to evaluate the vascular anatomy. CONTRAST:  2mL OMNIPAQUE IOHEXOL 350 MG/ML SOLN COMPARISON:  PET-CT 11/17/2019, radiograph 11/24/2019, CT chest 11/12/2019 FINDINGS: Cardiovascular: Satisfactory opacification the pulmonary arteries to the segmental level. No pulmonary artery filling defects are identified.  Tapering of the pulmonary arteries as they enter the region of the left lower lobe mass and hilar invasion detailed below. Central pulmonary arteries are otherwise normal caliber. The aortic root is suboptimally assessed given cardiac pulsation artifact. Atherosclerotic plaque within the normal caliber aorta. No definite acute luminal abnormality within limitations of this non tailored evaluation. Atherosclerotic calcification at the origins of the great vessels including a shared origin of the left common carotid and brachiocephalic artery. Normal cardiac size. No visible pericardial effusion. For the discernible fat plane between the left ventricular free wall and the adjacent mediastinal mass seen on comparison CT and PET though this can be partially attributable to cardiac motion and volume averaging. Mediastinum/Nodes: There is extensive direct invasion of a left lower lobe mass extending throughout the soft tissues of the left hilum and mediastinum demonstrating hypermetabolism on the comparison PET-CT compatible with a primary lung neoplasm. Previously FDG avid enlarged AP window lymph nodes measuring up to 2.3 cm (6/71) are similar to mildly increased in size from comparison. No acute abnormality of the trachea. Loss of discernible fat plane between the mediastinal mass and the thoracic esophagus. No concerning thyroid nodules. Lungs/Pleura: Redemonstration the heterogeneous Lea enhancing lesion seen centrally in the left lower lobe and extending into the medial aspect of the lingula/left upper lobe and invading the left hilum and extending along the mediastinal borders. Additional extensive FDG avid pleural nodularity is present as well. There is a persistent moderate size loculated malignant effusion. The central airways, pulmonary arteries appear mildly tapered with truncation of the pulmonary veins with narrowing and segmental occlusions of the central airways the lingula and left lower lobe. Additional  consolidative opacity and ground-glass within the aerated portions of the left lower lobe and lingula likely reflect some postobstructive infection or inflammation. There are remote postsurgical changes in the bilateral lung apices with evidence of prior thoracotomy. Findings are on a background of diffuse moderate centrilobular and severe paraseptal emphysematous change. Chronic  biapical areas of scarring are noted. Upper Abdomen: Pneumobilia noted in the liver. No other acute upper abdominal abnormality is evident. Musculoskeletal: Evidence of prior bilateral thoracotomy widening of the second interspaces bilaterally. No evidence of direct chest wall invasion at this time. Body wall edema is noted. No evidence of osseous metastatic disease. Review of the MIP images confirms the above findings. IMPRESSION: 1. No evidence of acute pulmonary artery filling defects. 2. Redemonstration of the heterogeneous enhancing lesion centrally in the left lower lobe and extending into the medial aspect of the lingula/left upper lobe as well as directly invading the left hilum and extending along the mediastinal borders. Findings compatible with primary lung neoplasm similar to slightly increased in size. 3. Redemonstration of the previously FDG avid pleural nodularity with a moderate persistent left malignant effusion with loculation. 4. Previously FDG avid enlarged AP window lymph nodes are similar to mildly increased in size from comparison PET-CT. 5. Left lung lesion with hilar invasion results in some narrowing of the pulmonary arteries, occlusion of the pulmonary veins and segmental occlusions of the central airways of the left lower lobe and lingula. Consolidative opacity and ground-glass within the aerated portions of the left lower lobe and lingula likely reflect some postobstructive infection or inflammation. 6. Evidence of prior bilateral thoracotomy widening of the second interspaces and postsurgical changes in the  lung apices. 7. Pneumobilia in the liver, correlate for prior sphincterotomy. 8. Aortic Atherosclerosis (ICD10-I70.0) 9. Emphysema (ICD10-J43.9). Electronically Signed   By: Lovena Le M.D.   On: 11/24/2019 23:10   MR BRAIN W WO CONTRAST  Result Date: 11/27/2019 CLINICAL DATA:  64 year old female with newly diagnosed lung cancer, favored to be extensive stage small cell on preliminary pathology. Staging. EXAM: MRI HEAD WITHOUT AND WITH CONTRAST TECHNIQUE: Multiplanar, multiecho pulse sequences of the brain and surrounding structures were obtained without and with intravenous contrast. CONTRAST:  7.31mL GADAVIST GADOBUTROL 1 MMOL/ML IV SOLN COMPARISON:  None. FINDINGS: Brain: Small developmental venous anomaly in the left superior frontal gyrus (normal variant - series 12, image 19). Similar small DVA in the right deep gray nuclei (series 12, image 18). Indeterminate although probably vascular related punctate focus of enhancement in the right inferior cerebellum on series 11, image 5. No other abnormal intracranial enhancement identified. No midline shift, mass effect. No dural thickening. Chronic appearing encephalomalacia of the posterior corpus callosum near the splenium (series 5, image 13) which is associated with some nonspecific splenium and other periventricular white matter T2 and FLAIR hyperintensity (series 7, image 13). No associated enhancement. No superimposed cortical encephalomalacia. No chronic cerebral blood products identified. Mild to moderate patchy nonspecific T2 and FLAIR hyperintensity also in the pons. No restricted diffusion to suggest acute infarction. No ventriculomegaly, extra-axial collection or acute intracranial hemorrhage. Cervicomedullary junction and pituitary are within normal limits. Vascular: Major intracranial vascular flow voids are preserved. The major dural venous sinuses are enhancing and appear to be patent. Skull and upper cervical spine: Negative visible cervical  spine and spinal cord. Visualized bone marrow signal is within normal limits. Sinuses/Orbits: Postoperative changes to both globes, otherwise negative orbits. Paranasal Visualized paranasal sinuses and mastoids are stable and well pneumatized. Other: Visible internal auditory structures appear normal. No suspicious scalp or face soft tissue lesion. IMPRESSION: 1. No definite metastatic disease to the brain. Several small developmental venous anomalies are noted, and a punctate area of enhancement in the right cerebellum on series 11, image 5 is also felt to be vascular in nature. Recommend repeat  Brain MRI without and with contrast in 3 months with attention to this area. 2. Chronic nonspecific white matter signal changes including some chronic encephalomalacia in the posterior corpus callosum. Electronically Signed   By: Genevie Ann M.D.   On: 11/27/2019 15:01   Cardiac event monitor  Result Date: 11/22/2019 Predominant rhythm is normal sinus Runs of NSVT with the longest being 15 beats. Runs of SVT with the longest being 9 beats.  Frequent supraventricular ectopy Infrequent ventricular ectopy.   NM PET Image Restage (PS) Skull Base to Thigh  Result Date: 11/17/2019 CLINICAL DATA:  Initial treatment strategy for left lung mass. EXAM: NUCLEAR MEDICINE PET SKULL BASE TO THIGH TECHNIQUE: 5.62 mCi F-18 FDG was injected intravenously. Full-ring PET imaging was performed from the skull base to thigh after the radiotracer. CT data was obtained and used for attenuation correction and anatomic localization. Fasting blood glucose: 99 mg/dl COMPARISON:  Chest CT 11/12/2019 FINDINGS: Mediastinal blood pool activity: SUV max 2.23 Liver activity: SUV max NA NECK: No hypermetabolic lymph nodes in the neck. Incidental CT findings: none CHEST: Large central left lower lobe mass invading the left hilum and mediastinum is markedly hypermetabolic with SUV max of 02.58. Findings consistent with a primary lung neoplasm. There is also  extensive pleural metastatic disease involving the left hemithorax with numerous large hypermetabolic pleural nodules. SUV max in these lesions ranges from 17.3 to 19.8. Associated malignant left pleural effusion. No breast masses, supraclavicular or axillary adenopathy. Severe underlying emphysematous changes. I do not see any definite metastatic pulmonary nodules in the right lung. Incidental CT findings: Age advanced vascular disease with aortic and coronary artery calcifications. ABDOMEN/PELVIS: Enlarged gastrohepatic ligament lymph node measures 19.5 mm on image 102/4. SUV max is 17.33. There is also a 19 mm left-sided retroperitoneal lymph node on image 109/4 which has an SUV max of 15.36. No findings for hepatic metastatic disease or adrenal gland metastatic disease. Left adrenal gland lesion measures 10 [place] units and is not show hypermetabolism consistent with a benign adenoma. No metastatic disease involving the pelvis. Incidental CT findings: Aortic calcifications. Evidence of prior bowel surgery. Advanced vascular calcifications. SKELETON: No focal hypermetabolic activity to suggest skeletal metastasis. Incidental CT findings: none IMPRESSION: 1. Large hypermetabolic central left lower lobe mass invading the left hilum and mediastinum and consistent with primary lung neoplasm. There is also extensive left-sided pleural metastatic disease and a malignant left pleural effusion. 2. Malignant upper abdominal lymphadenopathy. No hepatic or adrenal gland metastasis are demonstrated. 3. No findings for metastatic pulmonary nodules in the right lung. 4. No findings suspicious for osseous metastatic disease. Electronically Signed   By: Marijo Sanes M.D.   On: 11/17/2019 15:19   DG Chest Port 1 View  Result Date: 11/24/2019 CLINICAL DATA:  Shortness of breath. EXAM: PORTABLE CHEST 1 VIEW COMPARISON:  November 23, 2019 FINDINGS: Marked severity atelectasis and/or infiltrate is seen within the left lung base.  This is increased in severity when compared to the prior study. There is a small left pleural effusion which is also increased in size. No pneumothorax is identified. The heart size and mediastinal contours are within normal limits. A small deformity is seen along the lateral aspect of the upper left chest wall, with subsequent herniation of a small amount of left upper lobe. This is present on the prior study. There is mild dextroscoliosis of the midthoracic spine. IMPRESSION: 1. Marked severity left basilar atelectasis and/or infiltrate, increased in severity when compared to the prior study. 2.  Small left pleural effusion which is increased in size. Electronically Signed   By: Virgina Norfolk M.D.   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 Request received for diagnostic and therapeutic left thoracentesis. EXAM: ULTRASOUND GUIDED DIAGNOSTIC AND THERAPEUTIC LEFT THORACENTESIS MEDICATIONS: 1% lidocaine to skin and subcutaneous tissue COMPLICATIONS: None immediate. PROCEDURE: An ultrasound guided thoracentesis was thoroughly discussed with the patient and questions answered. The benefits, risks, alternatives and complications were also discussed. The patient understands and wishes to proceed with the procedure. Written consent was obtained. Ultrasound was performed to localize and mark an adequate pocket of fluid in the left chest. The area was then prepped and draped in the normal sterile fashion. 1% Lidocaine was used for local anesthesia. Under ultrasound guidance a 6 Fr Safe-T-Centesis catheter was introduced. Thoracentesis was performed. The catheter was removed and a dressing applied. FINDINGS: A total of approximately 1.1 liters of slightly hazy, yellow fluid was removed. Samples were sent to the  laboratory as requested by the clinical team. IMPRESSION: Successful ultrasound guided diagnostic and therapeutic left thoracentesis yielding 1.1 liters of pleural fluid. Read by: Rowe Robert, PA-C Electronically Signed   By: Jacqulynn Cadet M.D.   On: 11/23/2019 11:13    Labs:  CBC: Recent Labs    11/24/19 1958 11/25/19 0408 11/27/19 0300 12/01/19 1422  WBC 17.8* 16.2* 15.3* 18.7*  HGB 13.3 12.0 11.0* 12.8  HCT 40.8 38.2 33.6* 39.4  PLT 447* 431* 411* 558*    COAGS: No results for input(s): INR, APTT in the last 8760 hours.  BMP: Recent Labs    11/24/19 1958 11/25/19 0408 11/27/19 0300 12/01/19 1422  NA 134* 135 138 138  K 3.9 4.4 4.3 4.3  CL 98 100 104 100  CO2 21* 25 23 24   GLUCOSE 117* 97 106* 102*  BUN 9 10 9 14   CALCIUM 9.0 8.8* 8.4* 9.8  CREATININE 0.60 0.67 0.54 0.64  GFRNONAA >60 >60 >60 >60  GFRAA >60 >60 >60 >60    LIVER FUNCTION TESTS: Recent Labs    09/24/19 0921 11/11/19 0838 11/25/19 0408 12/01/19 1422  BILITOT 0.5 0.4 0.6 0.2*  AST 20 22 19 28   ALT 18 14 16 20   ALKPHOS 87 78 47 72  PROT 7.5 7.6 6.0* 7.3  ALBUMIN 4.2 3.9 2.4* 2.8*    TUMOR MARKERS: No results for input(s): AFPTM, CEA, CA199, CHROMGRNA in the last 8760 hours.  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  Patient has been NPO since 8 pm, no anticoagulation/antiplatelet medications. Afebrile, requiring 3L oxygen via [place].  Risks and benefits of image-guided Port-a-catheter placement were discussed with the patient including, but not limited to bleeding, infection, pneumothorax, or fibrin sheath development and need for additional procedures.  All of the patient's questions were answered, patient is agreeable to proceed.  Consent signed and in chart.  Thank you for this interesting consult.  I  greatly enjoyed meeting [name] [name] and look forward to participating in their care.  A copy of this report was sent to the requesting provider on this date.  Electronically Signed: Joaquim Nam, PA-C 12/07/2019, 8:23 AM   I spent a total  of30 Minutes   in face to face in clinical consultation, greater than 50% of which was counseling/coordinating care for port placement.

## 2019-12-08 NOTE — Progress Notes (Signed)
[place] OFFICE PROGRESS NOTE  [name], DO [address] [place] [address]  DIAGNOSIS: Extensive stage small cell lung cancer, the patient presented with a large left central left upper lobe lung mass invading the left hilum and mediastinum with extensive left-sided pleural metastatic disease and a malignant left pleural effusion.  The patient had associated upper abdominal lymphadenopathy.  She was diagnosed in August 2021.  PRIOR THERAPY: None  CURRENT THERAPY: Palliative systemic chemotherapy with carboplatin for an AUC of 5 on day 1, and etoposide 100 mg/m on days 1, 2, and 3 and Imfinzi 1500 mg IV every 3 weeks with Cosela for myeloprotection.  First dose expected on 12/02/2019.  Status post one cycle  INTERVAL HISTORY: [name] [name] 64 y.o. female returns to clinic today for a follow-up visit accompanied by her daughter.  The patient is feeling fairly well today without any concerning complaints.  In the interval since her last treatment, the patient had a Port-A-Cath placed and she tolerated this procedure well.  The patient also completed her first cycle of systemic chemotherapy last week and she tolerated it well without any concerning adverse side effects except for fatigue.   Today, the patient denies any fever, chills, or night sweats.  She has been losing weight prior to her recent diagnosis. She lost an additional 8 lbs since her last appointment which is significant. She does not drink supplemental drinks. She has been trying to eat small frequent meals and Gatorade and shakes from [facility]. She reports her baseline dyspnea on mild exertion as well as mild dry cough. It takes her about 15 minutes to cover after exertion. She is on 3L of oxygen via nasal cannula. She denies any hemoptysis.  The patient's been taking Norco for pain.  She localizes her pain to the left upper quadrant/to the left lower rib cage/back.  She describes this as  feeling swollen and tight. She denies any nausea, vomiting, constipation, or diarrhea.  She denies any headache or visual changes. The patient is here today for evaluation and a 1 week follow-up visit after completing her first cycle of systemic chemotherapy.   MEDICAL HISTORY: Past Medical History:  Diagnosis Date  . Allergy   . 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 Carcinoid tumor of abdomen   . Cataract    both eyes  . Family history of adverse reaction to anesthesia    Mother had trouble waking up  . Hypertension   . Lower back pain   . Neurofibromatosis, type I (von Recklinghausen's disease) ([place])   . Pneumothorax on left   . Pneumothorax on right     ALLERGIES:  has No Known Allergies.  MEDICATIONS:  Current Outpatient Medications  Medication Sig Dispense Refill  . prochlorperazine (COMPAZINE) 10 MG tablet Take 1 tablet (10 mg total) by mouth every 6 (six) hours as needed for nausea or vomiting. 30 tablet 0  . AYR SALINE NASAL NA Place 1 spray into the nose daily as needed (dryness).    . Cholecalciferol (D3) 50 MCG (2000 UT) TABS Take 2,000 Units by mouth daily with breakfast.     . lidocaine-prilocaine (EMLA) cream Apply to the Port-A-Cath site 30-60 minutes before treatment. 30 g 0  . metoprolol tartrate (LOPRESSOR) 25 MG tablet Take 0.5 tablets (12.5 mg total) by mouth 2 (two) times daily. 90 tablet 3  . Multiple Vitamins-Minerals (MULTIVITAMINS THER. W/MINERALS) TABS Take 1 tablet by  mouth daily.    . nicotine polacrilex (COMMIT) 4 MG lozenge Take 4 mg by mouth as needed for smoking cessation.    . traZODone (DESYREL) 50 MG tablet Take 1 tablet (50 mg total) by mouth at bedtime as needed for sleep. (Patient taking differently: Take 50 mg by mouth at bedtime. ) 30 tablet 1   No current facility-administered medications for this visit.    SURGICAL HISTORY:  Past Surgical History:  Procedure Laterality Date  . ABDOMINAL HYSTERECTOMY  04/23/1997   fibroids  TAH/ BSO  .  CHOLECYSTECTOMY    . COLONOSCOPY  2006  . EYE SURGERY Left 11/2015   cataract and astigmatism surgery with Intraocular Lens left eye  . IR IMAGING GUIDED PORT INSERTION  12/07/2019  . LASIK    . LUNG SURGERY  2008?   d/t pneumothroax  . MEMBRANE PEEL Left 08/16/2014   Procedure: MEMBRANE PEEL LEFT EYE ;  Surgeon: Sherlynn Stalls, MD;  Location: [facility];  Service: Ophthalmology;  Laterality: Left;  . OOPHORECTOMY    . PARS PLANA VITRECTOMY Left 08/16/2014   Procedure: PARS PLANA VITRECTOMY WITH 25 GAUGE;  Surgeon: Sherlynn Stalls, MD;  Location: [facility];  Service: Ophthalmology;  Laterality: Left;  . TONSILLECTOMY    . WHIPPLE PROCEDURE      REVIEW OF SYSTEMS:   Constitutional: Positive for fatigue, appetite change, weight loss.  Negative for chills and fever HENT: Negative for mouth sores, nosebleeds, sore throat and trouble swallowing.   Eyes: Negative for eye problems and icterus.  Respiratory: Positive for cough and shortness of breath. Negative for hemoptysis and wheezing.   Cardiovascular: Positive for left lower rib pain/LUQ pain. Negative for leg swelling.  Gastrointestinal:  Negative for abdominal pain, constipation, diarrhea, nausea and vomiting.  Genitourinary: Negative for bladder incontinence, difficulty urinating, dysuria, frequency and hematuria.   Musculoskeletal: Positive for pain that radiates to the left posterior thoracic region. Negative for gait problem, neck pain and neck stiffness.  Skin: Negative for itching and rash.  Neurological: Negative for dizziness, extremity weakness, gait problem, headaches, light-headedness and seizures.  Hematological: Negative for adenopathy. Does not bruise/bleed easily.  Psychiatric/Behavioral: Negative for confusion, depression and sleep disturbance. The patient is not nervous/anxious.      PHYSICAL EXAMINATION:  Blood pressure 133/72, pulse 100, temperature (!) 97 F (36.1 C), temperature source Tympanic, resp. rate 18, height 5' 2.5"  (1.588 m), weight 97 lb 14.4 oz (44.4 kg), SpO2 97 %.  ECOG PERFORMANCE STATUS: 2 - Symptomatic, <50% confined to bed  Physical Exam  Constitutional: Oriented to person, place, and time and cachetic appearing female and in no distress.  HENT:  Head: Normocephalic and atraumatic. neurofibromatosis associated lesions. Mouth/Throat: Oropharynx is clear and moist. No oropharyngeal exudate.  Eyes: Conjunctivae are normal. Right eye exhibits no discharge. Left eye exhibits no discharge. No scleral icterus.  Neck: Normal range of motion. Neck supple.  Cardiovascular: Normal rate, regular rhythm, normal heart sounds and intact distal pulses.   Pulmonary/Chest: Effort normal and breath sounds normal. No respiratory distress. No wheezes. No rales.  Abdominal: Soft. Bowel sounds are normal. Exhibits no distension and no mass. There is no tenderness.  Musculoskeletal: Normal range of motion. Exhibits no edema.  Lymphadenopathy:    No cervical adenopathy.  Neurological: Alert and oriented to person, place, and time. Exhibits normal muscle tone. Gait normal. Coordination normal.  Skin: Skin is warm and dry. No rash noted. Not diaphoretic. No erythema. No pallor.  Psychiatric: Mood, memory and judgment  normal.  Vitals reviewed.  LABORATORY DATA: Lab Results  Component Value Date   WBC 12.1 (H) 12/09/2019   HGB 12.9 12/09/2019   HCT 39.3 12/09/2019   MCV 84.2 12/09/2019   PLT 343 12/09/2019      Chemistry      Component Value Date/Time   NA 134 (L) 12/09/2019 1430   K 4.4 12/09/2019 1430   CL 97 (L) 12/09/2019 1430   CO2 28 12/09/2019 1430   BUN 20 12/09/2019 1430   CREATININE 0.64 12/09/2019 1430   CREATININE 0.63 07/11/2011 1016      Component Value Date/Time   CALCIUM 9.9 12/09/2019 1430   ALKPHOS 75 12/09/2019 1430   AST 20 12/09/2019 1430   ALT 24 12/09/2019 1430   BILITOT 0.5 12/09/2019 1430       RADIOGRAPHIC STUDIES:  DG Chest 1 View  Result Date: 11/23/2019 CLINICAL  DATA:  Post left thoracentesis EXAM: CHEST  1 VIEW COMPARISON:  Chest CT 11/12/2019 FINDINGS: No pneumothorax following left thoracentesis. Airspace disease in the left lower lobe. Underlying apical emphysematous changes and scarring. Heart is normal size. IMPRESSION: No pneumothorax following thoracentesis. Electronically Signed   By: Rolm Baptise M.D.   On: 11/23/2019 10:25   DG Thoracic Spine W/Swimmers  Result Date: 11/12/2019 CLINICAL DATA:  Left back pain for 2 weeks EXAM: LUMBAR SPINE - COMPLETE 4+ VIEW; THORACIC SPINE - 3 VIEWS COMPARISON:  03/06/2019 FINDINGS: Thoracic spine: Frontal and lateral views of the thoracic spine demonstrate rotatory scoliosis of the thoracolumbar spine, with reversal of normal lordosis at the thoracolumbar junction. No acute or destructive bony lesions. There is mild lower thoracic spondylosis. Incidental note is made of dense left basilar consolidation and possible left effusion, dedicated chest imaging with two view chest x-ray or chest CT is recommended. There is extensive background emphysema and scarring. Lumbar spine: Frontal, bilateral oblique, lateral views of the lumbar spine demonstrate mild rotatory scoliosis, right convex at the thoracolumbar junction. There is extensive multilevel spondylosis greatest at the L1/L2 and L2/L3 levels. No acute fractures. Sacroiliac joints are normal. IMPRESSION: 1. Dense consolidation and possible effusion at the left lung base, superimposed upon severe background emphysema. Dedicated chest imaging with either two-view chest x-ray or chest CT is recommended. 2. Rotatory scoliosis of the thoracolumbar spine. 3. Prominent spondylosis at the thoracolumbar junction. 4. No acute bony abnormality. These results will be called to the ordering clinician or representative by the Radiologist Assistant, and communication documented in the PACS or [employer]. Electronically Signed   By: Randa Ngo M.D.   On: 11/12/2019 00:07   DG  Lumbar Spine Complete  Result Date: 11/12/2019 CLINICAL DATA:  Left back pain for 2 weeks EXAM: LUMBAR SPINE - COMPLETE 4+ VIEW; THORACIC SPINE - 3 VIEWS COMPARISON:  03/06/2019 FINDINGS: Thoracic spine: Frontal and lateral views of the thoracic spine demonstrate rotatory scoliosis of the thoracolumbar spine, with reversal of normal lordosis at the thoracolumbar junction. No acute or destructive bony lesions. There is mild lower thoracic spondylosis. Incidental note is made of dense left basilar consolidation and possible left effusion, dedicated chest imaging with two view chest x-ray or chest CT is recommended. There is extensive background emphysema and scarring. Lumbar spine: Frontal, bilateral oblique, lateral views of the lumbar spine demonstrate mild rotatory scoliosis, right convex at the thoracolumbar junction. There is extensive multilevel spondylosis greatest at the L1/L2 and L2/L3 levels. No acute fractures. Sacroiliac joints are normal. IMPRESSION: 1. Dense consolidation and possible effusion at the  left lung base, superimposed upon severe background emphysema. Dedicated chest imaging with either two-view chest x-ray or chest CT is recommended. 2. Rotatory scoliosis of the thoracolumbar spine. 3. Prominent spondylosis at the thoracolumbar junction. 4. No acute bony abnormality. These results will be called to the ordering clinician or representative by the Radiologist Assistant, and communication documented in the PACS or [employer]. Electronically Signed   By: Randa Ngo M.D.   On: 11/12/2019 00:07   CT Chest W Contrast  Result Date: 11/12/2019 CLINICAL DATA:  Abnormal finding on recent x-ray. EXAM: CT CHEST WITH CONTRAST TECHNIQUE: Multidetector CT imaging of the chest was performed during intravenous contrast administration. CONTRAST:  28mL OMNIPAQUE IOHEXOL 300 MG/ML  SOLN COMPARISON:  March 06, 2019 FINDINGS: Cardiovascular: Mediastinum/Nodes: There is marked severity left hilar  lymphadenopathy. Thyroid gland, trachea, and esophagus demonstrate no significant findings. Lungs/Pleura: There is marked severity emphysematous lung disease with stable calcified biapical cysts/bullae noted. Moderate to marked severity atelectasis and/or infiltrate is seen within the left lung base. A 3.1 cm x 4.0 cm heterogeneous low-attenuation mass is seen along the medial aspect of the left lower lobe (axial CT image 97, CT series number 2). An additional 2.1 cm x 1.6 cm low-attenuation soft tissue mass is seen within the posteromedial aspect of the left lower lobe (axial CT images 93 through 103, CT series number 2). A 3.6 cm x 2.2 cm lobulated low-attenuation soft tissue mass is seen along the medial aspect of the left upper lobe, just above the level of the aortic arch. This represents a new finding when compared to the prior study. Adjacent 2.0 cm x 1.4 cm and 1.6 cm x 1.5 cm low-attenuation soft tissue masses are seen along the anteromedial aspect of the left upper lobe (axial CT image 69, CT series number 4). This also represents a new finding. Multiple lobulated pleural based soft tissue masses, of various sizes, are seen along the periphery of the left lung base. This represents a new finding when compared to the prior study. Upper Abdomen: There is a stable 2.7 cm x 2.0 cm low-attenuation left adrenal mass. An adjacent, inferior medial 1.6 cm x 1.7 cm lymph node is noted. This represents a new finding. Musculoskeletal: Multiple chronic anterior left-sided rib fractures are seen. Multilevel degenerative changes are seen throughout the thoracic spine IMPRESSION: 1. Heterogeneous soft tissue masses within the anteromedial and medial aspects of the left upper lobe and left lower lobe consistent with an underlying neoplastic process. 2. Moderate to marked severity atelectasis and/or infiltrate within the left lung base. 3. Multiple new low-attenuation soft tissue masses, of various sizes, along the  periphery of the left lung base, consistent with metastatic disease. 4. Stable low-attenuation left adrenal mass with an adjacent, inferior medial 1.6 cm x 1.7 cm lymph node. 5. Multiple chronic anterior left-sided rib fractures. 6. Marked severity emphysematous lung disease with stable calcified biapical cysts/bullae. Emphysema (ICD10-J43.9). Electronically Signed   By: Virgina Norfolk M.D.   On: 11/12/2019 16:20   CT Angio Chest PE W and/or Wo Contrast  Result Date: 11/24/2019 CLINICAL DATA:  Shortness of breath and tachycardia, thoracentesis yesterday EXAM: CT ANGIOGRAPHY CHEST WITH CONTRAST TECHNIQUE: Multidetector CT imaging of the chest was performed using the standard protocol during bolus administration of intravenous contrast. Multiplanar CT image reconstructions and MIPs were obtained to evaluate the vascular anatomy. CONTRAST:  33mL OMNIPAQUE IOHEXOL 350 MG/ML SOLN COMPARISON:  PET-CT 11/17/2019, radiograph 11/24/2019, CT chest 11/12/2019 FINDINGS: Cardiovascular: Satisfactory opacification the pulmonary  arteries to the segmental level. No pulmonary artery filling defects are identified. Tapering of the pulmonary arteries as they enter the region of the left lower lobe mass and hilar invasion detailed below. Central pulmonary arteries are otherwise normal caliber. The aortic root is suboptimally assessed given cardiac pulsation artifact. Atherosclerotic plaque within the normal caliber aorta. No definite acute luminal abnormality within limitations of this non tailored evaluation. Atherosclerotic calcification at the origins of the great vessels including a shared origin of the left common carotid and brachiocephalic artery. Normal cardiac size. No visible pericardial effusion. For the discernible fat plane between the left ventricular free wall and the adjacent mediastinal mass seen on comparison CT and PET though this can be partially attributable to cardiac motion and volume averaging.  Mediastinum/Nodes: There is extensive direct invasion of a left lower lobe mass extending throughout the soft tissues of the left hilum and mediastinum demonstrating hypermetabolism on the comparison PET-CT compatible with a primary lung neoplasm. Previously FDG avid enlarged AP window lymph nodes measuring up to 2.3 cm (6/71) are similar to mildly increased in size from comparison. No acute abnormality of the trachea. Loss of discernible fat plane between the mediastinal mass and the thoracic esophagus. No concerning thyroid nodules. Lungs/Pleura: Redemonstration the heterogeneous Lea enhancing lesion seen centrally in the left lower lobe and extending into the medial aspect of the lingula/left upper lobe and invading the left hilum and extending along the mediastinal borders. Additional extensive FDG avid pleural nodularity is present as well. There is a persistent moderate size loculated malignant effusion. The central airways, pulmonary arteries appear mildly tapered with truncation of the pulmonary veins with narrowing and segmental occlusions of the central airways the lingula and left lower lobe. Additional consolidative opacity and ground-glass within the aerated portions of the left lower lobe and lingula likely reflect some postobstructive infection or inflammation. There are remote postsurgical changes in the bilateral lung apices with evidence of prior thoracotomy. Findings are on a background of diffuse moderate centrilobular and severe paraseptal emphysematous change. Chronic biapical areas of scarring are noted. Upper Abdomen: Pneumobilia noted in the liver. No other acute upper abdominal abnormality is evident. Musculoskeletal: Evidence of prior bilateral thoracotomy widening of the second interspaces bilaterally. No evidence of direct chest wall invasion at this time. Body wall edema is noted. No evidence of osseous metastatic disease. Review of the MIP images confirms the above findings.  IMPRESSION: 1. No evidence of acute pulmonary artery filling defects. 2. Redemonstration of the heterogeneous enhancing lesion centrally in the left lower lobe and extending into the medial aspect of the lingula/left upper lobe as well as directly invading the left hilum and extending along the mediastinal borders. Findings compatible with primary lung neoplasm similar to slightly increased in size. 3. Redemonstration of the previously FDG avid pleural nodularity with a moderate persistent left malignant effusion with loculation. 4. Previously FDG avid enlarged AP window lymph nodes are similar to mildly increased in size from comparison PET-CT. 5. Left lung lesion with hilar invasion results in some narrowing of the pulmonary arteries, occlusion of the pulmonary veins and segmental occlusions of the central airways of the left lower lobe and lingula. Consolidative opacity and ground-glass within the aerated portions of the left lower lobe and lingula likely reflect some postobstructive infection or inflammation. 6. Evidence of prior bilateral thoracotomy widening of the second interspaces and postsurgical changes in the lung apices. 7. Pneumobilia in the liver, correlate for prior sphincterotomy. 8. Aortic Atherosclerosis (ICD10-I70.0) 9. Emphysema (  ICD10-J43.9). Electronically Signed   By: Lovena Le M.D.   On: 11/24/2019 23:10   MR BRAIN W WO CONTRAST  Result Date: 11/27/2019 CLINICAL DATA:  64 year old female with newly diagnosed lung cancer, favored to be extensive stage small cell on preliminary pathology. Staging. EXAM: MRI HEAD WITHOUT AND WITH CONTRAST TECHNIQUE: Multiplanar, multiecho pulse sequences of the brain and surrounding structures were obtained without and with intravenous contrast. CONTRAST:  7.41mL GADAVIST GADOBUTROL 1 MMOL/ML IV SOLN COMPARISON:  None. FINDINGS: Brain: Small developmental venous anomaly in the left superior frontal gyrus (normal variant - series 12, image 19). Similar  small DVA in the right deep gray nuclei (series 12, image 18). Indeterminate although probably vascular related punctate focus of enhancement in the right inferior cerebellum on series 11, image 5. No other abnormal intracranial enhancement identified. No midline shift, mass effect. No dural thickening. Chronic appearing encephalomalacia of the posterior corpus callosum near the splenium (series 5, image 13) which is associated with some nonspecific splenium and other periventricular white matter T2 and FLAIR hyperintensity (series 7, image 13). No associated enhancement. No superimposed cortical encephalomalacia. No chronic cerebral blood products identified. Mild to moderate patchy nonspecific T2 and FLAIR hyperintensity also in the pons. No restricted diffusion to suggest acute infarction. No ventriculomegaly, extra-axial collection or acute intracranial hemorrhage. Cervicomedullary junction and pituitary are within normal limits. Vascular: Major intracranial vascular flow voids are preserved. The major dural venous sinuses are enhancing and appear to be patent. Skull and upper cervical spine: Negative visible cervical spine and spinal cord. Visualized bone marrow signal is within normal limits. Sinuses/Orbits: Postoperative changes to both globes, otherwise negative orbits. Paranasal Visualized paranasal sinuses and mastoids are stable and well pneumatized. Other: Visible internal auditory structures appear normal. No suspicious scalp or face soft tissue lesion. IMPRESSION: 1. No definite metastatic disease to the brain. Several small developmental venous anomalies are noted, and a punctate area of enhancement in the right cerebellum on series 11, image 5 is also felt to be vascular in nature. Recommend repeat Brain MRI without and with contrast in 3 months with attention to this area. 2. Chronic nonspecific white matter signal changes including some chronic encephalomalacia in the posterior corpus callosum.  Electronically Signed   By: Genevie Ann M.D.   On: 11/27/2019 15:01   Cardiac event monitor  Result Date: 11/22/2019 Predominant rhythm is normal sinus Runs of NSVT with the longest being 15 beats. Runs of SVT with the longest being 9 beats.  Frequent supraventricular ectopy Infrequent ventricular ectopy.   NM PET Image Restage (PS) Skull Base to Thigh  Result Date: 11/17/2019 CLINICAL DATA:  Initial treatment strategy for left lung mass. EXAM: NUCLEAR MEDICINE PET SKULL BASE TO THIGH TECHNIQUE: 5.62 mCi F-18 FDG was injected intravenously. Full-ring PET imaging was performed from the skull base to thigh after the radiotracer. CT data was obtained and used for attenuation correction and anatomic localization. Fasting blood glucose: 99 mg/dl COMPARISON:  Chest CT 11/12/2019 FINDINGS: Mediastinal blood pool activity: SUV max 2.23 Liver activity: SUV max NA NECK: No hypermetabolic lymph nodes in the neck. Incidental CT findings: none CHEST: Large central left lower lobe mass invading the left hilum and mediastinum is markedly hypermetabolic with SUV max of 70.62. Findings consistent with a primary lung neoplasm. There is also extensive pleural metastatic disease involving the left hemithorax with numerous large hypermetabolic pleural nodules. SUV max in these lesions ranges from 17.3 to 19.8. Associated malignant left pleural effusion. No breast masses, supraclavicular  or axillary adenopathy. Severe underlying emphysematous changes. I do not see any definite metastatic pulmonary nodules in the right lung. Incidental CT findings: Age advanced vascular disease with aortic and coronary artery calcifications. ABDOMEN/PELVIS: Enlarged gastrohepatic ligament lymph node measures 19.5 mm on image 102/4. SUV max is 17.33. There is also a 19 mm left-sided retroperitoneal lymph node on image 109/4 which has an SUV max of 15.36. No findings for hepatic metastatic disease or adrenal gland metastatic disease. Left adrenal gland  lesion measures 10 Hounsfield units and is not show hypermetabolism consistent with a benign adenoma. No metastatic disease involving the pelvis. Incidental CT findings: Aortic calcifications. Evidence of prior bowel surgery. Advanced vascular calcifications. SKELETON: No focal hypermetabolic activity to suggest skeletal metastasis. Incidental CT findings: none IMPRESSION: 1. Large hypermetabolic central left lower lobe mass invading the left hilum and mediastinum and consistent with primary lung neoplasm. There is also extensive left-sided pleural metastatic disease and a malignant left pleural effusion. 2. Malignant upper abdominal lymphadenopathy. No hepatic or adrenal gland metastasis are demonstrated. 3. No findings for metastatic pulmonary nodules in the right lung. 4. No findings suspicious for osseous metastatic disease. Electronically Signed   By: Marijo Sanes M.D.   On: 11/17/2019 15:19   DG Chest Port 1 View  Result Date: 11/24/2019 CLINICAL DATA:  Shortness of breath. EXAM: PORTABLE CHEST 1 VIEW COMPARISON:  November 23, 2019 FINDINGS: Marked severity atelectasis and/or infiltrate is seen within the left lung base. This is increased in severity when compared to the prior study. There is a small left pleural effusion which is also increased in size. No pneumothorax is identified. The heart size and mediastinal contours are within normal limits. A small deformity is seen along the lateral aspect of the upper left chest wall, with subsequent herniation of a small amount of left upper lobe. This is present on the prior study. There is mild dextroscoliosis of the midthoracic spine. IMPRESSION: 1. Marked severity left basilar atelectasis and/or infiltrate, increased in severity when compared to the prior study. 2. Small left pleural effusion which is increased in size. Electronically Signed   By: Virgina Norfolk M.D.   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 She presents for port catheter placement for durable venous access prior to chemotherapy. EXAM: IMPLANTED PORT A CATH PLACEMENT WITH ULTRASOUND AND FLUOROSCOPIC GUIDANCE MEDICATIONS: 1 g Ancef; The antibiotic was administered within an appropriate time interval prior to skin puncture. ANESTHESIA/SEDATION: Versed 1 mg IV; Fentanyl 37.5 mcg IV; Moderate Sedation Time:  23 minutes The patient was continuously monitored during the procedure by the interventional radiology nurse under my direct supervision. FLUOROSCOPY TIME:  One minutes, 18 seconds (3 mGy) COMPLICATIONS: None immediate. PROCEDURE: The right neck and chest was prepped with chlorhexidine, and draped in the usual sterile fashion using maximum barrier technique (cap and mask, sterile gown, sterile gloves, large sterile sheet, hand hygiene and cutaneous antiseptic). Local anesthesia was attained by infiltration with 1% lidocaine with epinephrine. Ultrasound demonstrated patency of the right internal jugular vein, and this was documented with an image. Under real-time ultrasound guidance, this vein was accessed with a 21 gauge micropuncture needle and image documentation was performed. A small dermatotomy was made at the access site with an 11 scalpel. A 0.018" wire was advanced into the SVC and the access needle exchanged for a 71F micropuncture vascular sheath. The 0.018"  wire was then removed and a 0.035" wire advanced into the IVC. An appropriate location for the subcutaneous reservoir was selected below the clavicle and an incision was made through the skin and underlying soft tissues. The subcutaneous tissues were then dissected using a combination of blunt and sharp surgical technique and a pocket was formed. A single lumen low-profile power injectable portacatheter was then tunneled through the subcutaneous tissues from the pocket to the dermatotomy and  the port reservoir placed within the subcutaneous pocket. The venous access site was then serially dilated and a peel away vascular sheath placed over the wire. The wire was removed and the port catheter advanced into position under fluoroscopic guidance. The catheter tip is positioned in the superior cavoatrial junction. This was documented with a spot image. The portacatheter was then tested and found to flush and aspirate well. The port was flushed with saline followed by 100 units/mL heparinized saline. The pocket was then closed in two layers using first subdermal inverted interrupted absorbable sutures followed by a running subcuticular suture. The epidermis was then sealed with Dermabond. The dermatotomy at the venous access site was also closed with Dermabond. IMPRESSION: Successful placement of a right IJ approach Power Port with ultrasound and fluoroscopic guidance. The catheter is ready for use. Electronically Signed   By: Jacqulynn Cadet M.D.   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 Request received for diagnostic and therapeutic left thoracentesis. EXAM: ULTRASOUND GUIDED DIAGNOSTIC AND THERAPEUTIC LEFT THORACENTESIS MEDICATIONS: 1% lidocaine to skin and subcutaneous tissue COMPLICATIONS: None immediate. PROCEDURE: An ultrasound guided thoracentesis was thoroughly discussed with the patient and questions answered. The benefits, risks, alternatives and complications were also discussed. The patient understands and wishes to proceed with the procedure. Written consent was obtained. Ultrasound was performed to localize and mark an adequate pocket of fluid in the left chest. The area was then prepped and draped in the normal sterile fashion. 1% Lidocaine was used for local anesthesia. Under  ultrasound guidance a 6 Fr Safe-T-Centesis catheter was introduced. Thoracentesis was performed. The catheter was removed and a dressing applied. FINDINGS: A total of approximately 1.1 liters of slightly hazy, yellow fluid was removed. Samples were sent to the laboratory as requested by the clinical team. IMPRESSION: Successful ultrasound guided diagnostic and therapeutic left thoracentesis yielding 1.1 liters of pleural fluid. Read by: Rowe Robert, PA-C Electronically Signed   By: Jacqulynn Cadet M.D.   On: 11/23/2019 11:13     ASSESSMENT/PLAN:  This is a very pleasant 64 year old Caucasian female diagnosed with extensive stage small cell lung cancer, the patient presented with a large left central left upper lobe lung mass invading the left hilum and mediastinum with extensive left-sided pleural metastatic disease and a malignant left pleural effusion.  The patient had associated upper abdominal lymphadenopathy.  She was diagnosed in August 2021.  The patient recently had a brain MRI to complete the staging work-up which showed no definite metastatic disease to the brain. Several small developmental venous anomalies are noted, and a punctate area of enhancement in the right cerebellum shich also were felt to be vascular in nature. We will repeat imaging with routine imaging assessing for treatment response.   The patient is currently undergoing palliative systemic chemotherapy with carboplatin for an AUC of 5 on day one, etoposide 100 mg  per metered squared on days 1, 2, and 3 and Imfinzi 1500 mg IV on day one IV every 3 weeks.  The patient is also on Cosela for myeloprotection.  She is status post one cycle and she tolerated it well except for fatigue.    Labs were reviewed.  We recommend that she continue on the same treatment at the same dose.  She will receive cycle #2 in 2 weeks as scheduled.  She will continue using Norco for pain control.  I will place a referral to the nutritionist team  for evaluation and recommendations regarding her weight loss. She was encouraged to drink supplemental drinks, eat small frequent meals, and to drink plenty of fluids. Discussed appetite stimulating medications with the patient. They will think about it and we will revisit this discussion if she continues to lose weight despite trying to implement recommendations from the dietician.   Her daughter is the primary caregiver for the patient. They live together. The daughter is asking for a note to exclude her from jury duty due to being unable to have her mother be left at home alone. We will write a letter and they can pick it up next week when they return to the clinic for weekly labs.   The patient was advised to call immediately if she has any concerning symptoms in the interval. The patient voices understanding of current disease status and treatment options and is in agreement with the current care plan. All questions were answered. The patient knows to call the clinic with any problems, questions or concerns. We can certainly see the patient much sooner if necessary    Orders Placed This Encounter  Procedures  . Ambulatory referral to Nutrition and Diabetic E    Referral Priority:   Routine    Referral Type:   Consultation    Referral Reason:   Specialty Services Required    Number of Visits Requested:   Rodriguez Camp, PA-C 12/09/19

## 2019-12-09 ENCOUNTER — Encounter: Payer: Self-pay | Admitting: *Deleted

## 2019-12-09 ENCOUNTER — Other Ambulatory Visit: Payer: PPO

## 2019-12-09 ENCOUNTER — Inpatient Hospital Stay: Payer: PPO

## 2019-12-09 ENCOUNTER — Inpatient Hospital Stay (HOSPITAL_BASED_OUTPATIENT_CLINIC_OR_DEPARTMENT_OTHER): Payer: PPO | Admitting: Physician Assistant

## 2019-12-09 ENCOUNTER — Other Ambulatory Visit: Payer: Self-pay

## 2019-12-09 VITALS — BP 133/72 | HR 100 | Temp 97.0°F | Resp 18 | Ht 62.5 in | Wt 97.9 lb

## 2019-12-09 DIAGNOSIS — Z95828 Presence of other vascular implants and grafts: Secondary | ICD-10-CM

## 2019-12-09 DIAGNOSIS — C3492 Malignant neoplasm of unspecified part of left bronchus or lung: Secondary | ICD-10-CM | POA: Diagnosis not present

## 2019-12-09 DIAGNOSIS — Z5111 Encounter for antineoplastic chemotherapy: Secondary | ICD-10-CM | POA: Diagnosis not present

## 2019-12-09 DIAGNOSIS — R634 Abnormal weight loss: Secondary | ICD-10-CM

## 2019-12-09 LAB — CBC WITH DIFFERENTIAL (CANCER CENTER ONLY)
Abs Immature Granulocytes: 0.18 10*3/uL — ABNORMAL HIGH (ref 0.00–0.07)
Basophils Absolute: 0.1 10*3/uL (ref 0.0–0.1)
Basophils Relative: 1 %
Eosinophils Absolute: 0.2 10*3/uL (ref 0.0–0.5)
Eosinophils Relative: 2 %
HCT: 39.3 % (ref 36.0–46.0)
Hemoglobin: 12.9 g/dL (ref 12.0–15.0)
Immature Granulocytes: 2 %
Lymphocytes Relative: 19 %
Lymphs Abs: 2.2 10*3/uL (ref 0.7–4.0)
MCH: 27.6 pg (ref 26.0–34.0)
MCHC: 32.8 g/dL (ref 30.0–36.0)
MCV: 84.2 fL (ref 80.0–100.0)
Monocytes Absolute: 0.1 10*3/uL (ref 0.1–1.0)
Monocytes Relative: 1 %
Neutro Abs: 9.3 10*3/uL — ABNORMAL HIGH (ref 1.7–7.7)
Neutrophils Relative %: 75 %
Platelet Count: 343 10*3/uL (ref 150–400)
RBC: 4.67 MIL/uL (ref 3.87–5.11)
RDW: 13.1 % (ref 11.5–15.5)
WBC Count: 12.1 10*3/uL — ABNORMAL HIGH (ref 4.0–10.5)
nRBC: 0 % (ref 0.0–0.2)

## 2019-12-09 LAB — CMP (CANCER CENTER ONLY)
ALT: 24 U/L (ref 0–44)
AST: 20 U/L (ref 15–41)
Albumin: 3 g/dL — ABNORMAL LOW (ref 3.5–5.0)
Alkaline Phosphatase: 75 U/L (ref 38–126)
Anion gap: 9 (ref 5–15)
BUN: 20 mg/dL (ref 8–23)
CO2: 28 mmol/L (ref 22–32)
Calcium: 9.9 mg/dL (ref 8.9–10.3)
Chloride: 97 mmol/L — ABNORMAL LOW (ref 98–111)
Creatinine: 0.64 mg/dL (ref 0.44–1.00)
GFR, Est AFR Am: >60 mL/min (ref >60)
GFR, Estimated: >60 mL/min (ref >60)
Glucose, Bld: 98 mg/dL (ref 70–99)
Potassium: 4.4 mmol/L (ref 3.5–5.1)
Sodium: 134 mmol/L — ABNORMAL LOW (ref 135–145)
Total Bilirubin: 0.5 mg/dL (ref 0.3–1.2)
Total Protein: 7.2 g/dL (ref 6.5–8.1)

## 2019-12-09 MED ORDER — SODIUM CHLORIDE 0.9% FLUSH
10.0000 mL | INTRAVENOUS | Status: DC | PRN
  Administered 2019-12-09: 10 mL via INTRAVENOUS
  Filled 2019-12-09: qty 10

## 2019-12-09 MED ORDER — HEPARIN SOD (PORK) LOCK FLUSH 100 UNIT/ML IV SOLN
500.0000 [IU] | Freq: Once | INTRAVENOUS | Status: AC
  Administered 2019-12-09: 500 [IU] via INTRAVENOUS
  Filled 2019-12-09: qty 5

## 2019-12-10 ENCOUNTER — Encounter: Payer: Self-pay | Admitting: Medical Oncology

## 2019-12-10 ENCOUNTER — Encounter: Payer: Self-pay | Admitting: Internal Medicine

## 2019-12-16 ENCOUNTER — Inpatient Hospital Stay: Payer: PPO

## 2019-12-16 ENCOUNTER — Encounter: Payer: Self-pay | Admitting: Internal Medicine

## 2019-12-16 ENCOUNTER — Other Ambulatory Visit: Payer: Self-pay

## 2019-12-16 DIAGNOSIS — Z95828 Presence of other vascular implants and grafts: Secondary | ICD-10-CM

## 2019-12-16 DIAGNOSIS — Z5111 Encounter for antineoplastic chemotherapy: Secondary | ICD-10-CM | POA: Diagnosis not present

## 2019-12-16 DIAGNOSIS — C3492 Malignant neoplasm of unspecified part of left bronchus or lung: Secondary | ICD-10-CM

## 2019-12-16 LAB — CBC WITH DIFFERENTIAL (CANCER CENTER ONLY)
Abs Immature Granulocytes: 0.01 10*3/uL (ref 0.00–0.07)
Basophils Absolute: 0.1 10*3/uL (ref 0.0–0.1)
Basophils Relative: 1 %
Eosinophils Absolute: 0.1 10*3/uL (ref 0.0–0.5)
Eosinophils Relative: 2 %
HCT: 33.2 % — ABNORMAL LOW (ref 36.0–46.0)
Hemoglobin: 10.9 g/dL — ABNORMAL LOW (ref 12.0–15.0)
Immature Granulocytes: 0 %
Lymphocytes Relative: 49 %
Lymphs Abs: 2.5 10*3/uL (ref 0.7–4.0)
MCH: 27.7 pg (ref 26.0–34.0)
MCHC: 32.8 g/dL (ref 30.0–36.0)
MCV: 84.5 fL (ref 80.0–100.0)
Monocytes Absolute: 0.9 10*3/uL (ref 0.1–1.0)
Monocytes Relative: 18 %
Neutro Abs: 1.5 10*3/uL — ABNORMAL LOW (ref 1.7–7.7)
Neutrophils Relative %: 30 %
Platelet Count: 278 10*3/uL (ref 150–400)
RBC: 3.93 MIL/uL (ref 3.87–5.11)
RDW: 13.2 % (ref 11.5–15.5)
WBC Count: 5 10*3/uL (ref 4.0–10.5)
nRBC: 0 % (ref 0.0–0.2)

## 2019-12-16 LAB — CMP (CANCER CENTER ONLY)
ALT: 47 U/L — ABNORMAL HIGH (ref 0–44)
AST: 31 U/L (ref 15–41)
Albumin: 2.8 g/dL — ABNORMAL LOW (ref 3.5–5.0)
Alkaline Phosphatase: 94 U/L (ref 38–126)
Anion gap: 8 (ref 5–15)
BUN: 10 mg/dL (ref 8–23)
CO2: 29 mmol/L (ref 22–32)
Calcium: 9.6 mg/dL (ref 8.9–10.3)
Chloride: 100 mmol/L (ref 98–111)
Creatinine: 0.64 mg/dL (ref 0.44–1.00)
GFR, Est AFR Am: >60 mL/min (ref >60)
GFR, Estimated: >60 mL/min (ref >60)
Glucose, Bld: 97 mg/dL (ref 70–99)
Potassium: 4.2 mmol/L (ref 3.5–5.1)
Sodium: 137 mmol/L (ref 135–145)
Total Bilirubin: <0.2 mg/dL — ABNORMAL LOW (ref 0.3–1.2)
Total Protein: 7 g/dL (ref 6.5–8.1)

## 2019-12-16 MED ORDER — SODIUM CHLORIDE 0.9% FLUSH
10.0000 mL | INTRAVENOUS | Status: DC | PRN
  Administered 2019-12-16: 10 mL via INTRAVENOUS
  Filled 2019-12-16: qty 10

## 2019-12-16 MED ORDER — HEPARIN SOD (PORK) LOCK FLUSH 100 UNIT/ML IV SOLN
500.0000 [IU] | Freq: Once | INTRAVENOUS | Status: AC
  Administered 2019-12-16: 500 [IU] via INTRAVENOUS
  Filled 2019-12-16: qty 5

## 2019-12-16 NOTE — Progress Notes (Signed)
Met with patient in lobby to introduce myself as Financial Resource Specialist and to offer available resources.  Discussed one-time $1000 [employer] grant and qualifications to assist with personal expenses while going through treatment.  Gave her my card if interested in applying and for any additional financial questions or concerns. 

## 2019-12-17 ENCOUNTER — Encounter: Payer: Self-pay | Admitting: Internal Medicine

## 2019-12-17 NOTE — Progress Notes (Signed)
Patient called to inquire about [name] [name].  Advised what is needed to apply. She will bring 12/23/19.  She has my card for any additional financial questions or concerns.

## 2019-12-23 ENCOUNTER — Other Ambulatory Visit: Payer: Self-pay

## 2019-12-23 ENCOUNTER — Other Ambulatory Visit: Payer: PPO

## 2019-12-23 ENCOUNTER — Inpatient Hospital Stay: Payer: PPO

## 2019-12-23 ENCOUNTER — Encounter: Payer: Self-pay | Admitting: Internal Medicine

## 2019-12-23 ENCOUNTER — Inpatient Hospital Stay (HOSPITAL_BASED_OUTPATIENT_CLINIC_OR_DEPARTMENT_OTHER): Payer: PPO | Admitting: Internal Medicine

## 2019-12-23 ENCOUNTER — Inpatient Hospital Stay: Payer: PPO | Attending: Physician Assistant

## 2019-12-23 ENCOUNTER — Inpatient Hospital Stay: Payer: PPO | Admitting: Nutrition

## 2019-12-23 VITALS — BP 145/68 | HR 75 | Temp 97.8°F | Resp 18 | Ht 62.5 in | Wt 102.4 lb

## 2019-12-23 DIAGNOSIS — C3492 Malignant neoplasm of unspecified part of left bronchus or lung: Secondary | ICD-10-CM

## 2019-12-23 DIAGNOSIS — C3490 Malignant neoplasm of unspecified part of unspecified bronchus or lung: Secondary | ICD-10-CM

## 2019-12-23 DIAGNOSIS — C3412 Malignant neoplasm of upper lobe, left bronchus or lung: Secondary | ICD-10-CM | POA: Diagnosis not present

## 2019-12-23 DIAGNOSIS — I10 Essential (primary) hypertension: Secondary | ICD-10-CM | POA: Diagnosis not present

## 2019-12-23 DIAGNOSIS — Z5111 Encounter for antineoplastic chemotherapy: Secondary | ICD-10-CM

## 2019-12-23 DIAGNOSIS — Z5112 Encounter for antineoplastic immunotherapy: Secondary | ICD-10-CM

## 2019-12-23 DIAGNOSIS — Z7189 Other specified counseling: Secondary | ICD-10-CM | POA: Diagnosis not present

## 2019-12-23 DIAGNOSIS — Z79899 Other long term (current) drug therapy: Secondary | ICD-10-CM | POA: Diagnosis not present

## 2019-12-23 DIAGNOSIS — Z23 Encounter for immunization: Secondary | ICD-10-CM | POA: Diagnosis not present

## 2019-12-23 DIAGNOSIS — Z95828 Presence of other vascular implants and grafts: Secondary | ICD-10-CM

## 2019-12-23 LAB — CMP (CANCER CENTER ONLY)
ALT: 20 U/L (ref 0–44)
AST: 20 U/L (ref 15–41)
Albumin: 2.6 g/dL — ABNORMAL LOW (ref 3.5–5.0)
Alkaline Phosphatase: 99 U/L (ref 38–126)
Anion gap: 8 (ref 5–15)
BUN: 12 mg/dL (ref 8–23)
CO2: 26 mmol/L (ref 22–32)
Calcium: 9.8 mg/dL (ref 8.9–10.3)
Chloride: 103 mmol/L (ref 98–111)
Creatinine: 0.67 mg/dL (ref 0.44–1.00)
GFR, Est AFR Am: >60 mL/min (ref >60)
GFR, Estimated: >60 mL/min (ref >60)
Glucose, Bld: 110 mg/dL — ABNORMAL HIGH (ref 70–99)
Potassium: 5.2 mmol/L — ABNORMAL HIGH (ref 3.5–5.1)
Sodium: 137 mmol/L (ref 135–145)
Total Bilirubin: <0.2 mg/dL — ABNORMAL LOW (ref 0.3–1.2)
Total Protein: 6.6 g/dL (ref 6.5–8.1)

## 2019-12-23 LAB — CBC WITH DIFFERENTIAL (CANCER CENTER ONLY)
Abs Immature Granulocytes: 1.74 10*3/uL — ABNORMAL HIGH (ref 0.00–0.07)
Basophils Absolute: 0.1 10*3/uL (ref 0.0–0.1)
Basophils Relative: 1 %
Eosinophils Absolute: 0.3 10*3/uL (ref 0.0–0.5)
Eosinophils Relative: 2 %
HCT: 34.7 % — ABNORMAL LOW (ref 36.0–46.0)
Hemoglobin: 11.2 g/dL — ABNORMAL LOW (ref 12.0–15.0)
Immature Granulocytes: 10 %
Lymphocytes Relative: 21 %
Lymphs Abs: 3.7 10*3/uL (ref 0.7–4.0)
MCH: 27.5 pg (ref 26.0–34.0)
MCHC: 32.3 g/dL (ref 30.0–36.0)
MCV: 85.3 fL (ref 80.0–100.0)
Monocytes Absolute: 2.1 10*3/uL — ABNORMAL HIGH (ref 0.1–1.0)
Monocytes Relative: 12 %
Neutro Abs: 9.8 10*3/uL — ABNORMAL HIGH (ref 1.7–7.7)
Neutrophils Relative %: 54 %
Platelet Count: 573 10*3/uL — ABNORMAL HIGH (ref 150–400)
RBC: 4.07 MIL/uL (ref 3.87–5.11)
RDW: 13.9 % (ref 11.5–15.5)
WBC Count: 17.7 10*3/uL — ABNORMAL HIGH (ref 4.0–10.5)
nRBC: 0 % (ref 0.0–0.2)

## 2019-12-23 LAB — TSH: TSH: 2.272 u[IU]/mL (ref 0.308–3.960)

## 2019-12-23 MED ORDER — SODIUM CHLORIDE 0.9 % IV SOLN
350.0000 mg | Freq: Once | INTRAVENOUS | Status: AC
  Administered 2019-12-23: 350 mg via INTRAVENOUS
  Filled 2019-12-23: qty 35

## 2019-12-23 MED ORDER — TRILACICLIB DIHYDROCHLORIDE INJECTION 300 MG
240.0000 mg/m2 | Freq: Once | INTRAVENOUS | Status: AC
  Administered 2019-12-23: 375 mg via INTRAVENOUS
  Filled 2019-12-23: qty 25

## 2019-12-23 MED ORDER — HEPARIN SOD (PORK) LOCK FLUSH 100 UNIT/ML IV SOLN
500.0000 [IU] | Freq: Once | INTRAVENOUS | Status: AC | PRN
  Administered 2019-12-23: 500 [IU]
  Filled 2019-12-23: qty 5

## 2019-12-23 MED ORDER — HYDROCODONE-ACETAMINOPHEN 5-325 MG PO TABS
1.0000 | ORAL_TABLET | Freq: Four times a day (QID) | ORAL | 0 refills | Status: DC | PRN
Start: 2019-12-23 — End: 2020-01-11

## 2019-12-23 MED ORDER — SODIUM CHLORIDE 0.9 % IV SOLN
1500.0000 mg | Freq: Once | INTRAVENOUS | Status: AC
  Administered 2019-12-23: 1500 mg via INTRAVENOUS
  Filled 2019-12-23: qty 30

## 2019-12-23 MED ORDER — SODIUM CHLORIDE 0.9% FLUSH
10.0000 mL | INTRAVENOUS | Status: DC | PRN
  Administered 2019-12-23: 10 mL via INTRAVENOUS
  Filled 2019-12-23: qty 10

## 2019-12-23 MED ORDER — SODIUM CHLORIDE 0.9 % IV SOLN
100.0000 mg/m2 | Freq: Once | INTRAVENOUS | Status: AC
  Administered 2019-12-23: 160 mg via INTRAVENOUS
  Filled 2019-12-23: qty 8

## 2019-12-23 MED ORDER — PALONOSETRON HCL INJECTION 0.25 MG/5ML
0.2500 mg | Freq: Once | INTRAVENOUS | Status: AC
  Administered 2019-12-23: 0.25 mg via INTRAVENOUS

## 2019-12-23 MED ORDER — SODIUM CHLORIDE 0.9 % IV SOLN
150.0000 mg | Freq: Once | INTRAVENOUS | Status: AC
  Administered 2019-12-23: 150 mg via INTRAVENOUS
  Filled 2019-12-23: qty 150

## 2019-12-23 MED ORDER — SODIUM CHLORIDE 0.9 % IV SOLN
Freq: Once | INTRAVENOUS | Status: AC
  Filled 2019-12-23: qty 250

## 2019-12-23 MED ORDER — PALONOSETRON HCL INJECTION 0.25 MG/5ML
INTRAVENOUS | Status: AC
  Filled 2019-12-23: qty 5

## 2019-12-23 MED ORDER — SODIUM CHLORIDE 0.9% FLUSH
10.0000 mL | INTRAVENOUS | Status: DC | PRN
  Administered 2019-12-23: 10 mL
  Filled 2019-12-23: qty 10

## 2019-12-23 MED ORDER — SODIUM CHLORIDE 0.9 % IV SOLN
10.0000 mg | Freq: Once | INTRAVENOUS | Status: AC
  Administered 2019-12-23: 10 mg via INTRAVENOUS
  Filled 2019-12-23: qty 10

## 2019-12-23 NOTE — Progress Notes (Signed)
Met with patient/accompanying adult at registration. Patient provided proof of income for [employer].  Patient approved for one-time $1000 [employer] grant to assist with personal expenses while going through treatment. Discussed expenses and how they are covered. Gave her a copy of the approval letter and expense sheet as well as the Outpatient pharmacy information. She received a gas card today from her grant.   She has my card for any additional financial questions or concerns.

## 2019-12-23 NOTE — Patient Instructions (Addendum)
[facility] Discharge Instructions for Patients Receiving Chemotherapy  Today you received the following immunotherapy agent - Imfinzi.  And chemotherapy agents - Carboplatin and Etoposide  To help prevent nausea and vomiting after your treatment, we encourage you to take your nausea medication as prescribed.   If you develop nausea and vomiting that is not controlled by your nausea medication, call the clinic.   BELOW ARE SYMPTOMS THAT SHOULD BE REPORTED IMMEDIATELY:  *FEVER GREATER THAN 100.5 F  *CHILLS WITH OR WITHOUT FEVER  NAUSEA AND VOMITING THAT IS NOT CONTROLLED WITH YOUR NAUSEA MEDICATION  *UNUSUAL SHORTNESS OF BREATH  *UNUSUAL BRUISING OR BLEEDING  TENDERNESS IN MOUTH AND THROAT WITH OR WITHOUT PRESENCE OF ULCERS  *URINARY PROBLEMS  *BOWEL PROBLEMS  UNUSUAL RASH Items with * indicate a potential emergency and should be followed up as soon as possible.  Feel free to call the clinic should you have any questions or concerns. The clinic phone number is [phone].  Please show the [facility] at check-in to the Emergency Department and triage nurse.

## 2019-12-23 NOTE — Progress Notes (Signed)
[place] [phone]   [phone]  OFFICE PROGRESS NOTE  [name], DO [address] [place] [address]  DIAGNOSIS: Extensive stage small cell lung cancer,the patient presented with a large left central left upper lobe lung mass invading the left hilum and mediastinum with extensive left-sided pleural metastatic disease and a malignant left pleural effusion. The patient had associated upper abdominal lymphadenopathy. She was diagnosed in August2021.  PRIOR THERAPY: None  CURRENT THERAPY: Palliative systemic chemotherapy with carboplatin for an AUC of 5 on day 1, and etoposide 100 mg/m on days 1, 2, and 3 and Imfinzi 1500 mg IV every 3 weeks withCosela for myeloprotection. First dose expected on 12/02/2019.  Status post one cycle  INTERVAL HISTORY: [name] [name] 64 y.o. female returns to the clinic today for follow-up visit accompanied by her brother.  The patient is feeling fine today with no concerning complaints except for fatigue and intermittent pain on the left side of the lower rib cage as well as hip area.  She was taking Claritin based on recommendation from the on-call physician with no improvement.  She denied having any current chest pain, but has shortness of breath at baseline increased with exertion and she is still on home oxygen.  She has no cough or hemoptysis.  She denied having any fever or chills.  She has no nausea, vomiting, diarrhea or constipation.  She has no headache or visual changes.  She continues to tolerate her treatment with chemotherapy fairly well.  She is here today for evaluation before starting cycle #2.  MEDICAL HISTORY: Past Medical History:  Diagnosis Date  . Allergy   . 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 Carcinoid tumor of abdomen   . Cataract    both eyes  . Family history of adverse reaction to anesthesia    Mother had trouble waking up  . Hypertension   . Lower back  pain   . Neurofibromatosis, type I (von Recklinghausen's disease) ([place])   . Pneumothorax on left   . Pneumothorax on right     ALLERGIES:  has No Known Allergies.  MEDICATIONS:  Current Outpatient Medications  Medication Sig Dispense Refill  . AYR SALINE NASAL NA Place 1 spray into the nose daily as needed (dryness).    . Cholecalciferol (D3) 50 MCG (2000 UT) TABS Take 2,000 Units by mouth daily with breakfast.     . lidocaine-prilocaine (EMLA) cream Apply to the Port-A-Cath site 30-60 minutes before treatment. 30 g 0  . metoprolol tartrate (LOPRESSOR) 25 MG tablet Take 0.5 tablets (12.5 mg total) by mouth 2 (two) times daily. 90 tablet 3  . Multiple Vitamins-Minerals (MULTIVITAMINS THER. W/MINERALS) TABS Take 1 tablet by mouth daily.    . nicotine polacrilex (COMMIT) 4 MG lozenge Take 4 mg by mouth as needed for smoking cessation.    . prochlorperazine (COMPAZINE) 10 MG tablet Take 1 tablet (10 mg total) by mouth every 6 (six) hours as needed for nausea or vomiting. 30 tablet 0  . traZODone (DESYREL) 50 MG tablet Take 1 tablet (50 mg total) by mouth at bedtime as needed for sleep. (Patient taking differently: Take 50 mg by mouth at bedtime. ) 30 tablet 1   No current facility-administered medications for this visit.   Facility-Administered Medications Ordered in Other Visits  Medication Dose Route Frequency Provider Last Rate Last Admin  . sodium chloride flush (NS) 0.9 %  injection 10 mL  10 mL Intravenous PRN Ladell Pier, MD   10 mL at 12/23/19 0840    SURGICAL HISTORY:  Past Surgical History:  Procedure Laterality Date  . ABDOMINAL HYSTERECTOMY  04/23/1997   fibroids  TAH/ BSO  . CHOLECYSTECTOMY    . COLONOSCOPY  2006  . EYE SURGERY Left 11/2015   cataract and astigmatism surgery with Intraocular Lens left eye  . IR IMAGING GUIDED PORT INSERTION  12/07/2019  . LASIK    . LUNG SURGERY  2008?   d/t pneumothroax  . MEMBRANE PEEL Left 08/16/2014   Procedure: MEMBRANE PEEL  LEFT EYE ;  Surgeon: Sherlynn Stalls, MD;  Location: [facility];  Service: Ophthalmology;  Laterality: Left;  . OOPHORECTOMY    . PARS PLANA VITRECTOMY Left 08/16/2014   Procedure: PARS PLANA VITRECTOMY WITH 25 GAUGE;  Surgeon: Sherlynn Stalls, MD;  Location: [facility];  Service: Ophthalmology;  Laterality: Left;  . TONSILLECTOMY    . WHIPPLE PROCEDURE      REVIEW OF SYSTEMS:  Constitutional: positive for fatigue Eyes: negative Ears, nose, mouth, throat, and face: negative Respiratory: positive for cough and dyspnea on exertion Cardiovascular: negative Gastrointestinal: negative Genitourinary:negative Integument/breast: negative Hematologic/lymphatic: negative Musculoskeletal:positive for bone pain Neurological: negative Behavioral/Psych: negative Endocrine: negative Allergic/Immunologic: negative   PHYSICAL EXAMINATION: General appearance: alert, cooperative, fatigued and no distress Head: Normocephalic, without obvious abnormality, atraumatic Neck: no adenopathy, no JVD, supple, symmetrical, trachea midline and thyroid not enlarged, symmetric, no tenderness/mass/nodules Lymph nodes: Cervical, supraclavicular, and axillary nodes normal. Resp: clear to auscultation bilaterally Back: symmetric, no curvature. ROM normal. No CVA tenderness. Cardio: regular rate and rhythm, S1, S2 normal, no murmur, click, rub or gallop GI: soft, non-tender; bowel sounds normal; no masses,  no organomegaly Extremities: extremities normal, atraumatic, no cyanosis or edema Neurologic: Alert and oriented X 3, normal strength and tone. Normal symmetric reflexes. Normal coordination and gait  ECOG PERFORMANCE STATUS: 1 - Symptomatic but completely ambulatory  Blood pressure (!) 145/68, pulse 75, temperature 97.8 F (36.6 C), temperature source Tympanic, resp. rate 18, height 5' 2.5" (1.588 m), weight 102 lb 6.4 oz (46.4 kg), SpO2 100 %.  LABORATORY DATA: Lab Results  Component Value Date   WBC 17.7 (H) 12/23/2019     HGB 11.2 (L) 12/23/2019   HCT 34.7 (L) 12/23/2019   MCV 85.3 12/23/2019   PLT 573 (H) 12/23/2019      Chemistry      Component Value Date/Time   NA 137 12/16/2019 1405   K 4.2 12/16/2019 1405   CL 100 12/16/2019 1405   CO2 29 12/16/2019 1405   BUN 10 12/16/2019 1405   CREATININE 0.64 12/16/2019 1405   CREATININE 0.63 07/11/2011 1016      Component Value Date/Time   CALCIUM 9.6 12/16/2019 1405   ALKPHOS 94 12/16/2019 1405   AST 31 12/16/2019 1405   ALT 47 (H) 12/16/2019 1405   BILITOT <0.2 (L) 12/16/2019 1405       RADIOGRAPHIC STUDIES: DG Chest 1 View  Result Date: 11/23/2019 CLINICAL DATA:  Post left thoracentesis EXAM: CHEST  1 VIEW COMPARISON:  Chest CT 11/12/2019 FINDINGS: No pneumothorax following left thoracentesis. Airspace disease in the left lower lobe. Underlying apical emphysematous changes and scarring. Heart is normal size. IMPRESSION: No pneumothorax following thoracentesis. Electronically Signed   By: Rolm Baptise M.D.   On: 11/23/2019 10:25   CT Angio Chest PE W and/or Wo Contrast  Result Date: 11/24/2019 CLINICAL DATA:  Shortness of breath  and tachycardia, thoracentesis yesterday EXAM: CT ANGIOGRAPHY CHEST WITH CONTRAST TECHNIQUE: Multidetector CT imaging of the chest was performed using the standard protocol during bolus administration of intravenous contrast. Multiplanar CT image reconstructions and MIPs were obtained to evaluate the vascular anatomy. CONTRAST:  85mL OMNIPAQUE IOHEXOL 350 MG/ML SOLN COMPARISON:  PET-CT 11/17/2019, radiograph 11/24/2019, CT chest 11/12/2019 FINDINGS: Cardiovascular: Satisfactory opacification the pulmonary arteries to the segmental level. No pulmonary artery filling defects are identified. Tapering of the pulmonary arteries as they enter the region of the left lower lobe mass and hilar invasion detailed below. Central pulmonary arteries are otherwise normal caliber. The aortic root is suboptimally assessed given cardiac pulsation  artifact. Atherosclerotic plaque within the normal caliber aorta. No definite acute luminal abnormality within limitations of this non tailored evaluation. Atherosclerotic calcification at the origins of the great vessels including a shared origin of the left common carotid and brachiocephalic artery. Normal cardiac size. No visible pericardial effusion. For the discernible fat plane between the left ventricular free wall and the adjacent mediastinal mass seen on comparison CT and PET though this can be partially attributable to cardiac motion and volume averaging. Mediastinum/Nodes: There is extensive direct invasion of a left lower lobe mass extending throughout the soft tissues of the left hilum and mediastinum demonstrating hypermetabolism on the comparison PET-CT compatible with a primary lung neoplasm. Previously FDG avid enlarged AP window lymph nodes measuring up to 2.3 cm (6/71) are similar to mildly increased in size from comparison. No acute abnormality of the trachea. Loss of discernible fat plane between the mediastinal mass and the thoracic esophagus. No concerning thyroid nodules. Lungs/Pleura: Redemonstration the heterogeneous Lea enhancing lesion seen centrally in the left lower lobe and extending into the medial aspect of the lingula/left upper lobe and invading the left hilum and extending along the mediastinal borders. Additional extensive FDG avid pleural nodularity is present as well. There is a persistent moderate size loculated malignant effusion. The central airways, pulmonary arteries appear mildly tapered with truncation of the pulmonary veins with narrowing and segmental occlusions of the central airways the lingula and left lower lobe. Additional consolidative opacity and ground-glass within the aerated portions of the left lower lobe and lingula likely reflect some postobstructive infection or inflammation. There are remote postsurgical changes in the bilateral lung apices with evidence  of prior thoracotomy. Findings are on a background of diffuse moderate centrilobular and severe paraseptal emphysematous change. Chronic biapical areas of scarring are noted. Upper Abdomen: Pneumobilia noted in the liver. No other acute upper abdominal abnormality is evident. Musculoskeletal: Evidence of prior bilateral thoracotomy widening of the second interspaces bilaterally. No evidence of direct chest wall invasion at this time. Body wall edema is noted. No evidence of osseous metastatic disease. Review of the MIP images confirms the above findings. IMPRESSION: 1. No evidence of acute pulmonary artery filling defects. 2. Redemonstration of the heterogeneous enhancing lesion centrally in the left lower lobe and extending into the medial aspect of the lingula/left upper lobe as well as directly invading the left hilum and extending along the mediastinal borders. Findings compatible with primary lung neoplasm similar to slightly increased in size. 3. Redemonstration of the previously FDG avid pleural nodularity with a moderate persistent left malignant effusion with loculation. 4. Previously FDG avid enlarged AP window lymph nodes are similar to mildly increased in size from comparison PET-CT. 5. Left lung lesion with hilar invasion results in some narrowing of the pulmonary arteries, occlusion of the pulmonary veins and segmental occlusions of  the central airways of the left lower lobe and lingula. Consolidative opacity and ground-glass within the aerated portions of the left lower lobe and lingula likely reflect some postobstructive infection or inflammation. 6. Evidence of prior bilateral thoracotomy widening of the second interspaces and postsurgical changes in the lung apices. 7. Pneumobilia in the liver, correlate for prior sphincterotomy. 8. Aortic Atherosclerosis (ICD10-I70.0) 9. Emphysema (ICD10-J43.9). Electronically Signed   By: Lovena Le M.D.   On: 11/24/2019 23:10   MR BRAIN W WO  CONTRAST  Result Date: 11/27/2019 CLINICAL DATA:  64 year old female with newly diagnosed lung cancer, favored to be extensive stage small cell on preliminary pathology. Staging. EXAM: MRI HEAD WITHOUT AND WITH CONTRAST TECHNIQUE: Multiplanar, multiecho pulse sequences of the brain and surrounding structures were obtained without and with intravenous contrast. CONTRAST:  7.77mL GADAVIST GADOBUTROL 1 MMOL/ML IV SOLN COMPARISON:  None. FINDINGS: Brain: Small developmental venous anomaly in the left superior frontal gyrus (normal variant - series 12, image 19). Similar small DVA in the right deep gray nuclei (series 12, image 18). Indeterminate although probably vascular related punctate focus of enhancement in the right inferior cerebellum on series 11, image 5. No other abnormal intracranial enhancement identified. No midline shift, mass effect. No dural thickening. Chronic appearing encephalomalacia of the posterior corpus callosum near the splenium (series 5, image 13) which is associated with some nonspecific splenium and other periventricular white matter T2 and FLAIR hyperintensity (series 7, image 13). No associated enhancement. No superimposed cortical encephalomalacia. No chronic cerebral blood products identified. Mild to moderate patchy nonspecific T2 and FLAIR hyperintensity also in the pons. No restricted diffusion to suggest acute infarction. No ventriculomegaly, extra-axial collection or acute intracranial hemorrhage. Cervicomedullary junction and pituitary are within normal limits. Vascular: Major intracranial vascular flow voids are preserved. The major dural venous sinuses are enhancing and appear to be patent. Skull and upper cervical spine: Negative visible cervical spine and spinal cord. Visualized bone marrow signal is within normal limits. Sinuses/Orbits: Postoperative changes to both globes, otherwise negative orbits. Paranasal Visualized paranasal sinuses and mastoids are stable and well  pneumatized. Other: Visible internal auditory structures appear normal. No suspicious scalp or face soft tissue lesion. IMPRESSION: 1. No definite metastatic disease to the brain. Several small developmental venous anomalies are noted, and a punctate area of enhancement in the right cerebellum on series 11, image 5 is also felt to be vascular in nature. Recommend repeat Brain MRI without and with contrast in 3 months with attention to this area. 2. Chronic nonspecific white matter signal changes including some chronic encephalomalacia in the posterior corpus callosum. Electronically Signed   By: Genevie Ann M.D.   On: 11/27/2019 15:01   DG Chest Port 1 View  Result Date: 11/24/2019 CLINICAL DATA:  Shortness of breath. EXAM: PORTABLE CHEST 1 VIEW COMPARISON:  November 23, 2019 FINDINGS: Marked severity atelectasis and/or infiltrate is seen within the left lung base. This is increased in severity when compared to the prior study. There is a small left pleural effusion which is also increased in size. No pneumothorax is identified. The heart size and mediastinal contours are within normal limits. A small deformity is seen along the lateral aspect of the upper left chest wall, with subsequent herniation of a small amount of left upper lobe. This is present on the prior study. There is mild dextroscoliosis of the midthoracic spine. IMPRESSION: 1. Marked severity left basilar atelectasis and/or infiltrate, increased in severity when compared to the prior study. 2. Small left  pleural effusion which is increased in size. Electronically Signed   By: Virgina Norfolk M.D.   On: 11/24/2019 20:04   IR IMAGING GUIDED PORT INSERTION  Result Date: 12/07/2019 INDICATION: 64 year old female with neurofibromatosis and small cell lung cancer of the left lung. She presents for port catheter placement for durable venous access prior to chemotherapy. EXAM: IMPLANTED PORT A CATH PLACEMENT WITH ULTRASOUND AND FLUOROSCOPIC GUIDANCE  MEDICATIONS: 1 g Ancef; The antibiotic was administered within an appropriate time interval prior to skin puncture. ANESTHESIA/SEDATION: Versed 1 mg IV; Fentanyl 37.5 mcg IV; Moderate Sedation Time:  23 minutes The patient was continuously monitored during the procedure by the interventional radiology nurse under my direct supervision. FLUOROSCOPY TIME:  One minutes, 18 seconds (3 mGy) COMPLICATIONS: None immediate. PROCEDURE: The right neck and chest was prepped with chlorhexidine, and draped in the usual sterile fashion using maximum barrier technique (cap and mask, sterile gown, sterile gloves, large sterile sheet, hand hygiene and cutaneous antiseptic). Local anesthesia was attained by infiltration with 1% lidocaine with epinephrine. Ultrasound demonstrated patency of the right internal jugular vein, and this was documented with an image. Under real-time ultrasound guidance, this vein was accessed with a 21 gauge micropuncture needle and image documentation was performed. A small dermatotomy was made at the access site with an 11 scalpel. A 0.018" wire was advanced into the SVC and the access needle exchanged for a 76F micropuncture vascular sheath. The 0.018" wire was then removed and a 0.035" wire advanced into the IVC. An appropriate location for the subcutaneous reservoir was selected below the clavicle and an incision was made through the skin and underlying soft tissues. The subcutaneous tissues were then dissected using a combination of blunt and sharp surgical technique and a pocket was formed. A single lumen low-profile power injectable portacatheter was then tunneled through the subcutaneous tissues from the pocket to the dermatotomy and the port reservoir placed within the subcutaneous pocket. The venous access site was then serially dilated and a peel away vascular sheath placed over the wire. The wire was removed and the port catheter advanced into position under fluoroscopic guidance. The catheter  tip is positioned in the superior cavoatrial junction. This was documented with a spot image. The portacatheter was then tested and found to flush and aspirate well. The port was flushed with saline followed by 100 units/mL heparinized saline. The pocket was then closed in two layers using first subdermal inverted interrupted absorbable sutures followed by a running subcuticular suture. The epidermis was then sealed with Dermabond. The dermatotomy at the venous access site was also closed with Dermabond. IMPRESSION: Successful placement of a right IJ approach Power Port with ultrasound and fluoroscopic guidance. The catheter is ready for use. Electronically Signed   By: Jacqulynn Cadet M.D.   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 Request received for diagnostic and therapeutic left thoracentesis. EXAM: ULTRASOUND GUIDED DIAGNOSTIC AND THERAPEUTIC LEFT THORACENTESIS MEDICATIONS: 1% lidocaine to skin and subcutaneous tissue COMPLICATIONS: None immediate. PROCEDURE: An ultrasound guided thoracentesis was thoroughly discussed with the patient and questions answered. The benefits, risks, alternatives and complications were also discussed. The patient understands and wishes to proceed with the procedure. Written consent was obtained. Ultrasound was performed to localize and mark an adequate pocket of fluid in the left chest. The area was  then prepped and draped in the normal sterile fashion. 1% Lidocaine was used for local anesthesia. Under ultrasound guidance a 6 Fr Safe-T-Centesis catheter was introduced. Thoracentesis was performed. The catheter was removed and a dressing applied. FINDINGS: A total of approximately 1.1 liters of slightly hazy, yellow fluid was removed. Samples were sent to the laboratory  as requested by the clinical team. IMPRESSION: Successful ultrasound guided diagnostic and therapeutic left thoracentesis yielding 1.1 liters of pleural fluid. Read by: Rowe Robert, PA-C Electronically Signed   By: Jacqulynn Cadet M.D.   On: 11/23/2019 11:13    ASSESSMENT AND PLAN: This is a very pleasant 64 years old white female recently diagnosed with extensive stage small cell lung cancer presented with large left central upper lobe lung mass invading the left hilum and mediastinum with extensive left-sided pleural metastatic disease as well as malignant left pleural effusion and upper abdominal lymphadenopathy diagnosed in August 2021. The patient is currently undergoing a course of systemic chemotherapy with carboplatin for AUC of 5 on day 1, etoposide 100 mg/M2 on days 1, 2 and 3 with Cosela on the days of chemotherapy.  She is status post 1 cycle.  She tolerated the first cycle of her treatment well with no concerning adverse effects and she has some improvement of her symptoms. I recommended for the patient to proceed with cycle #2 today as planned. She will come back for follow-up visit in 3 weeks for evaluation with repeat CT scan of the chest, abdomen pelvis for restaging of her disease. For the pain management, I will give the patient a refill of Norco. For the hypertension she was advised to monitor her blood pressure closely at home. The patient was advised to call immediately if she has any other concerning symptoms in the interval. The patient voices understanding of current disease status and treatment options and is in agreement with the current care plan.  All questions were answered. The patient knows to call the clinic with any problems, questions or concerns. We can certainly see the patient much sooner if necessary.  The total time spent in the appointment was 40 minutes.  Disclaimer: This note was dictated with voice recognition software. Similar sounding words can  inadvertently be transcribed and may not be corrected upon review.

## 2019-12-23 NOTE — Progress Notes (Signed)
Pt unsure if she wanted to stay accessed for tomorrows treatment. Take home dressing applied just in case

## 2019-12-23 NOTE — Progress Notes (Signed)
64 year old female diagnosed with small cell lung cancer on oxygen receiving chemotherapy and followed by Dr. Benay Spice.  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  Medications include Compazine, vitamin D3, multivitamin,  Labs include potassium 5.2, glucose 110, and albumin 2.6.  Height: 62.5 inches. Weight: 102.4 pounds. Usual body weight: 120 pounds per patient. BMI: 18.43. ECOG: 2  Patient reports her oral intake and her appetite have improved because her breathing has improved. She is pleased she has gained a few pounds since her last visit 2 weeks ago. She is eating small amounts of food throughout the day. She has consumed oral nutrition supplements and can tolerate them as long as she adds ice cream or vanilla extract. She is trying to improve her activity.  Nutrition diagnosis:  Unintended weight loss related to cancer and associated treatments as evidenced by14% weight loss from usual body weight.  Intervention: Educated patient to continue small frequent meals and snacks with high-calorie, high-protein foods. Encouraged oral nutrition supplements 1-2 bottles daily between meals.  I provided samples and coupons. Educated on strategies for improving appetite. Provided fact sheets on poor appetite, increasing calories and protein, and high-protein foods. Questions were answered.  Teach back method used.  Contact information given.  Monitoring, evaluation, goals: Patient will tolerate increased calories and protein to promote weight gain/weight stabilization.  Next visit: Wednesday, September 22 during infusion.  **Disclaimer: This note was dictated with voice recognition software. Similar sounding words can inadvertently be transcribed and this note may contain transcription errors which may not have been corrected upon publication of note.**

## 2019-12-24 ENCOUNTER — Inpatient Hospital Stay: Payer: PPO

## 2019-12-24 ENCOUNTER — Other Ambulatory Visit: Payer: Self-pay

## 2019-12-24 VITALS — BP 142/61 | HR 66 | Temp 97.9°F | Resp 18

## 2019-12-24 DIAGNOSIS — Z5111 Encounter for antineoplastic chemotherapy: Secondary | ICD-10-CM | POA: Diagnosis not present

## 2019-12-24 DIAGNOSIS — C3492 Malignant neoplasm of unspecified part of left bronchus or lung: Secondary | ICD-10-CM

## 2019-12-24 MED ORDER — HEPARIN SOD (PORK) LOCK FLUSH 100 UNIT/ML IV SOLN
500.0000 [IU] | Freq: Once | INTRAVENOUS | Status: AC | PRN
  Administered 2019-12-24: 500 [IU]
  Filled 2019-12-24: qty 5

## 2019-12-24 MED ORDER — SODIUM CHLORIDE 0.9% FLUSH
10.0000 mL | INTRAVENOUS | Status: DC | PRN
  Administered 2019-12-24: 10 mL
  Filled 2019-12-24: qty 10

## 2019-12-24 MED ORDER — SODIUM CHLORIDE 0.9 % IV SOLN
100.0000 mg/m2 | Freq: Once | INTRAVENOUS | Status: AC
  Administered 2019-12-24: 160 mg via INTRAVENOUS
  Filled 2019-12-24: qty 8

## 2019-12-24 MED ORDER — TRILACICLIB DIHYDROCHLORIDE INJECTION 300 MG
375.0000 mg | Freq: Once | INTRAVENOUS | Status: AC
  Administered 2019-12-24: 375 mg via INTRAVENOUS
  Filled 2019-12-24: qty 25

## 2019-12-24 MED ORDER — SODIUM CHLORIDE 0.9 % IV SOLN
10.0000 mg | Freq: Once | INTRAVENOUS | Status: AC
  Administered 2019-12-24: 10 mg via INTRAVENOUS
  Filled 2019-12-24: qty 10

## 2019-12-24 MED ORDER — SODIUM CHLORIDE 0.9 % IV SOLN
Freq: Once | INTRAVENOUS | Status: AC
  Filled 2019-12-24: qty 250

## 2019-12-24 NOTE — Progress Notes (Signed)
Clarified dose of Cosela to RN as 240 mg/m2   Larene Beach, PharmD

## 2019-12-24 NOTE — Patient Instructions (Signed)
[facility] Discharge Instructions for Patients Receiving Chemotherapy  Today you received the following chemotherapy agents: Etoposide  To help prevent nausea and vomiting after your treatment, we encourage you to take your nausea medication as prescribed.    If you develop nausea and vomiting that is not controlled by your nausea medication, call the clinic.   BELOW ARE SYMPTOMS THAT SHOULD BE REPORTED IMMEDIATELY:  *FEVER GREATER THAN 100.5 F  *CHILLS WITH OR WITHOUT FEVER  NAUSEA AND VOMITING THAT IS NOT CONTROLLED WITH YOUR NAUSEA MEDICATION  *UNUSUAL SHORTNESS OF BREATH  *UNUSUAL BRUISING OR BLEEDING  TENDERNESS IN MOUTH AND THROAT WITH OR WITHOUT PRESENCE OF ULCERS  *URINARY PROBLEMS  *BOWEL PROBLEMS  UNUSUAL RASH Items with * indicate a potential emergency and should be followed up as soon as possible.  Feel free to call the clinic should you have any questions or concerns. The clinic phone number is [phone].  Please show the [facility] at check-in to the Emergency Department and triage nurse.

## 2019-12-25 ENCOUNTER — Other Ambulatory Visit: Payer: Self-pay

## 2019-12-25 ENCOUNTER — Inpatient Hospital Stay: Payer: PPO

## 2019-12-25 VITALS — BP 156/72 | HR 65 | Temp 97.5°F | Resp 18

## 2019-12-25 DIAGNOSIS — C3492 Malignant neoplasm of unspecified part of left bronchus or lung: Secondary | ICD-10-CM

## 2019-12-25 DIAGNOSIS — Z5111 Encounter for antineoplastic chemotherapy: Secondary | ICD-10-CM | POA: Diagnosis not present

## 2019-12-25 MED ORDER — SODIUM CHLORIDE 0.9 % IV SOLN
10.0000 mg | Freq: Once | INTRAVENOUS | Status: AC
  Administered 2019-12-25: 10 mg via INTRAVENOUS
  Filled 2019-12-25: qty 10

## 2019-12-25 MED ORDER — SODIUM CHLORIDE 0.9 % IV SOLN
Freq: Once | INTRAVENOUS | Status: AC
  Filled 2019-12-25: qty 250

## 2019-12-25 MED ORDER — TRILACICLIB DIHYDROCHLORIDE INJECTION 300 MG
375.0000 mg | Freq: Once | INTRAVENOUS | Status: AC
  Administered 2019-12-25: 375 mg via INTRAVENOUS
  Filled 2019-12-25: qty 25

## 2019-12-25 MED ORDER — SODIUM CHLORIDE 0.9% FLUSH
10.0000 mL | INTRAVENOUS | Status: DC | PRN
  Administered 2019-12-25: 10 mL
  Filled 2019-12-25: qty 10

## 2019-12-25 MED ORDER — HEPARIN SOD (PORK) LOCK FLUSH 100 UNIT/ML IV SOLN
500.0000 [IU] | Freq: Once | INTRAVENOUS | Status: AC | PRN
  Administered 2019-12-25: 500 [IU]
  Filled 2019-12-25: qty 5

## 2019-12-25 MED ORDER — SODIUM CHLORIDE 0.9 % IV SOLN
100.0000 mg/m2 | Freq: Once | INTRAVENOUS | Status: AC
  Administered 2019-12-25: 160 mg via INTRAVENOUS
  Filled 2019-12-25: qty 8

## 2019-12-25 NOTE — Patient Instructions (Signed)
[facility] Discharge Instructions for Patients Receiving Chemotherapy  Today you received the following chemotherapy agents: Etoposide  To help prevent nausea and vomiting after your treatment, we encourage you to take your nausea medication as prescribed.    If you develop nausea and vomiting that is not controlled by your nausea medication, call the clinic.   BELOW ARE SYMPTOMS THAT SHOULD BE REPORTED IMMEDIATELY:  *FEVER GREATER THAN 100.5 F  *CHILLS WITH OR WITHOUT FEVER  NAUSEA AND VOMITING THAT IS NOT CONTROLLED WITH YOUR NAUSEA MEDICATION  *UNUSUAL SHORTNESS OF BREATH  *UNUSUAL BRUISING OR BLEEDING  TENDERNESS IN MOUTH AND THROAT WITH OR WITHOUT PRESENCE OF ULCERS  *URINARY PROBLEMS  *BOWEL PROBLEMS  UNUSUAL RASH Items with * indicate a potential emergency and should be followed up as soon as possible.  Feel free to call the clinic should you have any questions or concerns. The clinic phone number is [phone].  Please show the [facility] at check-in to the Emergency Department and triage nurse.

## 2019-12-27 ENCOUNTER — Other Ambulatory Visit: Payer: Self-pay | Admitting: Internal Medicine

## 2019-12-30 ENCOUNTER — Inpatient Hospital Stay: Payer: PPO

## 2019-12-30 ENCOUNTER — Other Ambulatory Visit: Payer: Self-pay | Admitting: Internal Medicine

## 2019-12-30 ENCOUNTER — Other Ambulatory Visit: Payer: Self-pay

## 2019-12-30 DIAGNOSIS — Z23 Encounter for immunization: Secondary | ICD-10-CM

## 2019-12-30 DIAGNOSIS — Z5111 Encounter for antineoplastic chemotherapy: Secondary | ICD-10-CM | POA: Diagnosis not present

## 2019-12-30 DIAGNOSIS — C3492 Malignant neoplasm of unspecified part of left bronchus or lung: Secondary | ICD-10-CM

## 2019-12-30 LAB — CMP (CANCER CENTER ONLY)
ALT: 20 U/L (ref 0–44)
AST: 18 U/L (ref 15–41)
Albumin: 3.2 g/dL — ABNORMAL LOW (ref 3.5–5.0)
Alkaline Phosphatase: 84 U/L (ref 38–126)
Anion gap: 9 (ref 5–15)
BUN: 14 mg/dL (ref 8–23)
CO2: 29 mmol/L (ref 22–32)
Calcium: 9.5 mg/dL (ref 8.9–10.3)
Chloride: 98 mmol/L (ref 98–111)
Creatinine: 0.62 mg/dL (ref 0.44–1.00)
GFR, Est AFR Am: >60 mL/min (ref >60)
GFR, Estimated: >60 mL/min (ref >60)
Glucose, Bld: 106 mg/dL — ABNORMAL HIGH (ref 70–99)
Potassium: 4.6 mmol/L (ref 3.5–5.1)
Sodium: 136 mmol/L (ref 135–145)
Total Bilirubin: 0.3 mg/dL (ref 0.3–1.2)
Total Protein: 7.3 g/dL (ref 6.5–8.1)

## 2019-12-30 LAB — CBC WITH DIFFERENTIAL (CANCER CENTER ONLY)
Abs Immature Granulocytes: 0.15 10*3/uL — ABNORMAL HIGH (ref 0.00–0.07)
Basophils Absolute: 0.1 10*3/uL (ref 0.0–0.1)
Basophils Relative: 1 %
Eosinophils Absolute: 0.1 10*3/uL (ref 0.0–0.5)
Eosinophils Relative: 1 %
HCT: 37.6 % (ref 36.0–46.0)
Hemoglobin: 12.5 g/dL (ref 12.0–15.0)
Immature Granulocytes: 1 %
Lymphocytes Relative: 23 %
Lymphs Abs: 2.5 10*3/uL (ref 0.7–4.0)
MCH: 27.9 pg (ref 26.0–34.0)
MCHC: 33.2 g/dL (ref 30.0–36.0)
MCV: 83.9 fL (ref 80.0–100.0)
Monocytes Absolute: 0.1 10*3/uL (ref 0.1–1.0)
Monocytes Relative: 1 %
Neutro Abs: 8.1 10*3/uL — ABNORMAL HIGH (ref 1.7–7.7)
Neutrophils Relative %: 73 %
Platelet Count: 333 10*3/uL (ref 150–400)
RBC: 4.48 MIL/uL (ref 3.87–5.11)
RDW: 13.3 % (ref 11.5–15.5)
WBC Count: 11 10*3/uL — ABNORMAL HIGH (ref 4.0–10.5)
nRBC: 0 % (ref 0.0–0.2)

## 2019-12-31 ENCOUNTER — Other Ambulatory Visit: Payer: Self-pay | Admitting: Medical Oncology

## 2019-12-31 ENCOUNTER — Encounter: Payer: Self-pay | Admitting: Internal Medicine

## 2019-12-31 DIAGNOSIS — Z5111 Encounter for antineoplastic chemotherapy: Secondary | ICD-10-CM

## 2019-12-31 MED ORDER — PROCHLORPERAZINE MALEATE 10 MG PO TABS: 10.0000 mg | ORAL_TABLET | Freq: Four times a day (QID) | ORAL | 0 refills | Status: DC | PRN

## 2020-01-01 DIAGNOSIS — J439 Emphysema, unspecified: Secondary | ICD-10-CM | POA: Diagnosis not present

## 2020-01-06 ENCOUNTER — Inpatient Hospital Stay: Payer: PPO

## 2020-01-06 ENCOUNTER — Other Ambulatory Visit: Payer: Self-pay

## 2020-01-06 DIAGNOSIS — Z5111 Encounter for antineoplastic chemotherapy: Secondary | ICD-10-CM | POA: Diagnosis not present

## 2020-01-06 DIAGNOSIS — Z95828 Presence of other vascular implants and grafts: Secondary | ICD-10-CM

## 2020-01-06 DIAGNOSIS — C3492 Malignant neoplasm of unspecified part of left bronchus or lung: Secondary | ICD-10-CM

## 2020-01-06 LAB — CBC WITH DIFFERENTIAL (CANCER CENTER ONLY)
Abs Immature Granulocytes: 0.03 10*3/uL (ref 0.00–0.07)
Basophils Absolute: 0.1 10*3/uL (ref 0.0–0.1)
Basophils Relative: 1 %
Eosinophils Absolute: 0.1 10*3/uL (ref 0.0–0.5)
Eosinophils Relative: 1 %
HCT: 32.5 % — ABNORMAL LOW (ref 36.0–46.0)
Hemoglobin: 10.5 g/dL — ABNORMAL LOW (ref 12.0–15.0)
Immature Granulocytes: 0 %
Lymphocytes Relative: 33 %
Lymphs Abs: 3.2 10*3/uL (ref 0.7–4.0)
MCH: 27 pg (ref 26.0–34.0)
MCHC: 32.3 g/dL (ref 30.0–36.0)
MCV: 83.5 fL (ref 80.0–100.0)
Monocytes Absolute: 1.5 10*3/uL — ABNORMAL HIGH (ref 0.1–1.0)
Monocytes Relative: 15 %
Neutro Abs: 4.9 10*3/uL (ref 1.7–7.7)
Neutrophils Relative %: 50 %
Platelet Count: 244 10*3/uL (ref 150–400)
RBC: 3.89 MIL/uL (ref 3.87–5.11)
RDW: 14.6 % (ref 11.5–15.5)
WBC Count: 9.8 10*3/uL (ref 4.0–10.5)
nRBC: 0 % (ref 0.0–0.2)

## 2020-01-06 LAB — CMP (CANCER CENTER ONLY)
ALT: 21 U/L (ref 0–44)
AST: 19 U/L (ref 15–41)
Albumin: 3.1 g/dL — ABNORMAL LOW (ref 3.5–5.0)
Alkaline Phosphatase: 83 U/L (ref 38–126)
Anion gap: 4 — ABNORMAL LOW (ref 5–15)
BUN: 14 mg/dL (ref 8–23)
CO2: 26 mmol/L (ref 22–32)
Calcium: 9.2 mg/dL (ref 8.9–10.3)
Chloride: 105 mmol/L (ref 98–111)
Creatinine: 0.61 mg/dL (ref 0.44–1.00)
GFR, Est AFR Am: >60 mL/min (ref >60)
GFR, Estimated: >60 mL/min (ref >60)
Glucose, Bld: 101 mg/dL — ABNORMAL HIGH (ref 70–99)
Potassium: 4.3 mmol/L (ref 3.5–5.1)
Sodium: 135 mmol/L (ref 135–145)
Total Bilirubin: 0.2 mg/dL — ABNORMAL LOW (ref 0.3–1.2)
Total Protein: 6.9 g/dL (ref 6.5–8.1)

## 2020-01-06 MED ORDER — SODIUM CHLORIDE 0.9% FLUSH
10.0000 mL | INTRAVENOUS | Status: DC | PRN
  Administered 2020-01-06: 10 mL via INTRAVENOUS
  Filled 2020-01-06: qty 10

## 2020-01-06 MED ORDER — HEPARIN SOD (PORK) LOCK FLUSH 100 UNIT/ML IV SOLN
500.0000 [IU] | Freq: Once | INTRAVENOUS | Status: AC
  Administered 2020-01-06: 500 [IU] via INTRAVENOUS
  Filled 2020-01-06: qty 5

## 2020-01-06 NOTE — Patient Instructions (Signed)

## 2020-01-11 ENCOUNTER — Encounter (HOSPITAL_COMMUNITY): Payer: Self-pay

## 2020-01-11 ENCOUNTER — Ambulatory Visit: Payer: PPO | Admitting: Emergency Medicine

## 2020-01-11 ENCOUNTER — Encounter: Payer: Self-pay | Admitting: Emergency Medicine

## 2020-01-11 ENCOUNTER — Other Ambulatory Visit: Payer: Self-pay

## 2020-01-11 ENCOUNTER — Ambulatory Visit (HOSPITAL_COMMUNITY)
Admission: RE | Admit: 2020-01-11 | Discharge: 2020-01-11 | Disposition: A | Discharge status: Home / Self Care | Payer: PPO | Source: Ambulatory Visit | Attending: Internal Medicine | Admitting: Internal Medicine

## 2020-01-11 ENCOUNTER — Other Ambulatory Visit: Payer: Self-pay | Admitting: Emergency Medicine

## 2020-01-11 DIAGNOSIS — N281 Cyst of kidney, acquired: Secondary | ICD-10-CM | POA: Diagnosis not present

## 2020-01-11 DIAGNOSIS — C3492 Malignant neoplasm of unspecified part of left bronchus or lung: Secondary | ICD-10-CM | POA: Diagnosis not present

## 2020-01-11 DIAGNOSIS — I700 Atherosclerosis of aorta: Secondary | ICD-10-CM | POA: Diagnosis not present

## 2020-01-11 DIAGNOSIS — J90 Pleural effusion, not elsewhere classified: Secondary | ICD-10-CM | POA: Diagnosis not present

## 2020-01-11 DIAGNOSIS — K7689 Other specified diseases of liver: Secondary | ICD-10-CM | POA: Diagnosis not present

## 2020-01-11 DIAGNOSIS — D3502 Benign neoplasm of left adrenal gland: Secondary | ICD-10-CM | POA: Diagnosis not present

## 2020-01-11 DIAGNOSIS — C3490 Malignant neoplasm of unspecified part of unspecified bronchus or lung: Secondary | ICD-10-CM | POA: Insufficient documentation

## 2020-01-11 DIAGNOSIS — I2510 Atherosclerotic heart disease of native coronary artery without angina pectoris: Secondary | ICD-10-CM | POA: Diagnosis not present

## 2020-01-11 DIAGNOSIS — J439 Emphysema, unspecified: Secondary | ICD-10-CM | POA: Diagnosis not present

## 2020-01-11 MED ORDER — IOHEXOL 300 MG/ML  SOLN
75.0000 mL | Freq: Once | INTRAMUSCULAR | Status: AC | PRN
  Administered 2020-01-11: 75 mL via INTRAVENOUS

## 2020-01-11 MED ORDER — HEPARIN SOD (PORK) LOCK FLUSH 100 UNIT/ML IV SOLN
INTRAVENOUS | Status: AC
  Administered 2020-01-11: 500 [IU] via INTRAVENOUS
  Filled 2020-01-11: qty 5

## 2020-01-11 MED ORDER — HEPARIN SOD (PORK) LOCK FLUSH 100 UNIT/ML IV SOLN: 500.0000 [IU] | Freq: Once | INTRAVENOUS | Status: AC

## 2020-01-11 MED ORDER — ALBUTEROL SULFATE HFA 108 (90 BASE) MCG/ACT IN AERS: 2.0000 | INHALATION_SPRAY | Freq: Four times a day (QID) | RESPIRATORY_TRACT | 6 refills | Status: DC | PRN

## 2020-01-11 MED ORDER — SPIRIVA RESPIMAT 2.5 MCG/ACT IN AERS: 2.0000 | INHALATION_SPRAY | Freq: Every day | RESPIRATORY_TRACT | 0 refills | Status: DC

## 2020-01-11 MED ORDER — SPIRIVA RESPIMAT 2.5 MCG/ACT IN AERS: 2.0000 | INHALATION_SPRAY | Freq: Every day | RESPIRATORY_TRACT | 5 refills | Status: DC

## 2020-01-11 NOTE — Assessment & Plan Note (Signed)
Not currently on bronchodilator therapy.  She is willing to do a trial of Spiriva Respimat.  We will start today.  She also needs a rescue inhaler and we discussed the difference between maintenance and as needed medication.  We will send prescription for albuterol to her pharmacy.  Consider pulmonary function testing at some point going forward.

## 2020-01-11 NOTE — Patient Instructions (Signed)
Continue to follow with Dr. Julien Nordmann as planned. We will start Spiriva Respimat, 2 puffs once daily.  Call and let us know if you benefit from this medication.  If so then we will continue it and order it through your pharmacy Please keep albuterol available to use 2 puffs up to every 4 hours if needed for shortness of breath, chest tightness, wheezing. Follow with Dr Lamonte Sakai in 4 months or sooner if you have any problems.

## 2020-01-11 NOTE — Assessment & Plan Note (Signed)
She is completed to cycles of chemotherapy, preparing for her third cycle now.  Her CT scan today was reviewed, shows decrease in size of her left lung masses, pleural mets.  The left pleural effusion is smaller with better aeration of the left lung

## 2020-01-11 NOTE — Addendum Note (Signed)
Addended by: Gavin Potters R on: 01/11/2020 05:11 PM   Modules accepted: Orders

## 2020-01-11 NOTE — Progress Notes (Signed)
Subjective:    Patient ID: [name] [name], female    DOB: 1955-10-07, 64 y.o.   MRN: [id number]  HPI 64 year old former smoker with COPD/emphysema, history of pneumothorax, hypertension and NF.  She was found to have left-sided pulmonary soft tissue masses in the lungs with associated effusion 11/12/2019.  She required hospitalization for reaccumulation of her effusion 11/25/2019.  Pleural fluid revealed extensive stage small cell lung cancer.  She has been followed by Dr. Julien Nordmann currently receiving systemic chemotherapy.  Since last time she reports that she has stopped smoking. She stopped using her O2 about 9 days ago - her Spo2 has been > 90% consistently. She has a nebulizer at home - no meds, hasn't used in a long time. Not currently on BD therapy. Still w exertional SOB. Not having any L sided pleuritic pain.   A repeat CT scan of her chest done 01/11/2020 reviewed by me, shows interval decrease in size of a left infrahilar mass with improved left pleural metastases and mediastinal and upper abdominal lymphadenopathy.  No new disease is noted. There is a decreased left pleural effusion with improved aeration of the left lung   Review of Systems As per HPI  Past Medical History:  Diagnosis Date  . Allergy   . 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 Carcinoid tumor of abdomen   . Cataract    both eyes  . Family history of adverse reaction to anesthesia    Mother had trouble waking up  . Hypertension   . Lower back pain   . 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 Pneumothorax on left   . Pneumothorax on right      Family History  Problem Relation Age of Onset  . Cancer Mother   . Cancer Father   . Heart disease Father   . Cancer Sister   . Heart disease Sister   . Dementia Sister   . Diabetes Brother   . Colon cancer Neg Hx      Social History   Socioeconomic History  . Marital status: Single    Spouse name: Not on file  . Number of children: 1  .  Years of education: Not on file  . Highest education level: Not on file  Occupational History  . Occupation: retired    Comment: disabled  Tobacco Use  . Smoking status: Former Smoker    Packs/day: 0.50    Years: 47.00    Pack years: 23.50    Types: Cigarettes    Quit date: 11/28/2019    Years since quitting: 0.1  . Smokeless tobacco: Never Used  . Tobacco comment: Quit somking 11/28/19  Substance and Sexual Activity  . Alcohol use: Yes    Alcohol/week: 0.0 standard drinks    Comment: RARE 1-2 drinks year  . Drug use: No  . Sexual activity: Never  Other Topics Concern  . Not on file  Social History Narrative   Single   Adopted daughter   2 Dogs      Exercise-no   Social Determinants of Health   Financial Resource Strain:   . Difficulty of Paying Living Expenses: Not on file  Food Insecurity:   . Worried About Charity fundraiser in the Last Year: Not on file  . Ran Out of Food in the Last Year: Not on file  Transportation Needs:   . Lack of Transportation (Medical): Not on file  . Lack of Transportation (Non-Medical):  Not on file  Physical Activity:   . Days of Exercise per Week: Not on file  . Minutes of Exercise per Session: Not on file  Stress:   . Feeling of Stress : Not on file  Social Connections:   . Frequency of Communication with Friends and Family: Not on file  . Frequency of Social Gatherings with Friends and Family: Not on file  . Attends Religious Services: Not on file  . Active Member of Clubs or Organizations: Not on file  . Attends Archivist Meetings: Not on file  . Marital Status: Not on file  Intimate Partner Violence:   . Fear of Current or Ex-Partner: Not on file  . Emotionally Abused: Not on file  . Physically Abused: Not on file  . Sexually Abused: Not on file     No Known Allergies   Outpatient Medications Prior to Visit  Medication Sig Dispense Refill  . AYR SALINE NASAL NA Place 1 spray into the nose daily as needed  (dryness).    . Cholecalciferol (D3) 50 MCG (2000 UT) TABS Take 2,000 Units by mouth daily with breakfast.     . HYDROcodone-acetaminophen (NORCO/VICODIN) 5-325 MG tablet Take 1-2 tablets by mouth every 6 (six) hours as needed for moderate pain.    Marland Kitchen lidocaine-prilocaine (EMLA) cream Apply to the Port-A-Cath site 30-60 minutes before treatment. 30 g 0  . Multiple Vitamins-Minerals (MULTIVITAMINS THER. W/MINERALS) TABS Take 1 tablet by mouth daily.    . prochlorperazine (COMPAZINE) 10 MG tablet Take 1 tablet (10 mg total) by mouth every 6 (six) hours as needed for nausea or vomiting. 30 tablet 0  . traZODone (DESYREL) 50 MG tablet Take 1 tablet (50 mg total) by mouth at bedtime as needed for sleep. (Patient taking differently: Take 50 mg by mouth at bedtime. ) 30 tablet 1  . metoprolol tartrate (LOPRESSOR) 25 MG tablet Take 0.5 tablets (12.5 mg total) by mouth 2 (two) times daily. 90 tablet 3  . HYDROcodone-acetaminophen (NORCO) 5-325 MG tablet Take 1 tablet by mouth every 6 (six) hours as needed for moderate pain. 30 tablet 0  . loratadine (CLARITIN) 10 MG tablet Take 10 mg by mouth daily as needed for allergies.    Marland Kitchen loratadine-pseudoephedrine (CLARITIN-D 24 HOUR) 10-240 MG 24 hr tablet      No facility-administered medications prior to visit.         Objective:   Physical Exam  Vitals:   01/11/20 1537  BP: 118/70  Pulse: 95  Temp: 98.3 F (36.8 C)  TempSrc: Temporal  SpO2: 96%  Weight: 102 lb 3.2 oz (46.4 kg)  Height: 5' 2.5" (1.588 m)   Gen: Pleasant, thin, in no distress,  normal affect  ENT: No lesions,  mouth clear,  oropharynx clear, no postnasal drip  Neck: No JVD, no stridor  Lungs: No use of accessory muscles, no crackles or wheezing on normal respiration, no wheeze on forced expiration  Cardiovascular: RRR, heart sounds normal, no murmur or gallops, no peripheral edema  Musculoskeletal: No deformities, no cyanosis or clubbing  Neuro: alert, awake, non focal   Skin: Warm, no lesions or rash     Assessment & Plan:  COPD (chronic obstructive pulmonary disease) with emphysema (HCC) Not currently on bronchodilator therapy.  She is willing to do a trial of Spiriva Respimat.  We will start today.  She also needs a rescue inhaler and we discussed the difference between maintenance and as needed medication.  We will send prescription  for albuterol to her pharmacy.  Consider pulmonary function testing at some point going forward.  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  Her CT scan today was reviewed, shows decrease in size of her left lung masses, pleural mets.  The left pleural effusion is smaller with better aeration of the left lung  Baltazar Apo, MD, PhD 01/11/2020, 4:16 PM [facility] [phone] or if no answer [phone]

## 2020-01-11 NOTE — Progress Notes (Signed)
[facility] OFFICE PROGRESS NOTE  [name], DO [address] [place] [address]  DIAGNOSIS: Extensive stage small cell lung cancer,the patient presented with a large left central left upper lobe lung mass invading the left hilum and mediastinum with extensive left-sided pleural metastatic disease and a malignant left pleural effusion. The patient had associated upper abdominal lymphadenopathy. She was diagnosed in August2021.  PRIOR THERAPY: None  CURRENT THERAPY: Palliative systemic chemotherapy with carboplatin for an AUC of 5 on day 1, and etoposide 100 mg/m on days 1, 2, and 3 and Imfinzi 1500 mg IV every 3 weeks withCosela for myeloprotection. First dose expected on 12/02/2019.  Status post 2 cycles.   INTERVAL HISTORY: CHANIQUE DUCA 64 y.o. female returns to the clinic today for a follow up visit accompanied by her brother. The patient is feeling well today without any concerning complaints. The patient continues to tolerate treatment with chemotherapy/immunotherapy well without any concerning adverse side effects. Denies any fever, chills, or night sweats. She is followed by a member of the nutritionist team for her weight loss. She is scheduled to see them today. Her and her family are trying to push the protein in her diet. She reports her baseline dyspnea on exertion as well as mild dry cough. She notes she has been able to recover faster though after exertion. She has not needed her supplemental oxygen in 11 days. Her oxygen saturation is 99% on RA today. She quit smoking over a month ago as well.  She denies any hemoptysis.  Denies any vomiting, diarrhea, or constipation but reports mild nausea. Denies any headache or visual changes. Denies any rashes or skin changes. The patient recently had a restaging CT scan. The patient is here today for evaluation and to review her scan results prior to starting cycle # 3   MEDICAL HISTORY: Past  Medical History:  Diagnosis Date  . Allergy   . 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 Carcinoid tumor of abdomen   . Cataract    both eyes  . Family history of adverse reaction to anesthesia    Mother had trouble waking up  . Hypertension   . Lower back pain   . Neurofibromatosis, type I (von Recklinghausen's disease) ([place])   . Pneumothorax on left   . Pneumothorax on right     ALLERGIES:  has No Known Allergies.  MEDICATIONS:  Current Outpatient Medications  Medication Sig Dispense Refill  . Cholecalciferol (D3) 50 MCG (2000 UT) TABS Take 2,000 Units by mouth daily with breakfast.     . metoprolol tartrate (LOPRESSOR) 25 MG tablet Take 0.5 tablets (12.5 mg total) by mouth 2 (two) times daily. 90 tablet 3  . Multiple Vitamins-Minerals (MULTIVITAMINS THER. W/MINERALS) TABS Take 1 tablet by mouth daily.    . prochlorperazine (COMPAZINE) 10 MG tablet Take 1 tablet (10 mg total) by mouth every 6 (six) hours as needed for nausea or vomiting. 30 tablet 0  . Tiotropium Bromide Monohydrate (SPIRIVA RESPIMAT) 2.5 MCG/ACT AERS Inhale 2 puffs into the lungs daily. 4 g 5  . traZODone (DESYREL) 50 MG tablet Take 1 tablet (50 mg total) by mouth at bedtime as needed for sleep. (Patient taking differently: Take 50 mg by mouth at bedtime. ) 30 tablet 1  . albuterol (VENTOLIN HFA) 108 (90 Base) MCG/ACT inhaler Inhale 2 puffs into the lungs every 6 (six) hours as needed for wheezing or shortness of breath. (  Patient not taking: Reported on 01/13/2020) 18 g 6  . AYR SALINE NASAL NA Place 1 spray into the nose daily as needed (dryness). (Patient not taking: Reported on 01/13/2020)    . HYDROcodone-acetaminophen (NORCO/VICODIN) 5-325 MG tablet Take 1-2 tablets by mouth every 6 (six) hours as needed for moderate pain. (Patient not taking: Reported on 01/13/2020)    . lidocaine-prilocaine (EMLA) cream Apply to the Port-A-Cath site 30-60 minutes before treatment. (Patient not taking: Reported on 01/13/2020) 30 g 0   No  current facility-administered medications for this visit.    SURGICAL HISTORY:  Past Surgical History:  Procedure Laterality Date  . ABDOMINAL HYSTERECTOMY  04/23/1997   fibroids  TAH/ BSO  . CHOLECYSTECTOMY    . COLONOSCOPY  2006  . EYE SURGERY Left 11/2015   cataract and astigmatism surgery with Intraocular Lens left eye  . IR IMAGING GUIDED PORT INSERTION  12/07/2019  . LASIK    . LUNG SURGERY  2008?   d/t pneumothroax  . MEMBRANE PEEL Left 08/16/2014   Procedure: MEMBRANE PEEL LEFT EYE ;  Surgeon: Sherlynn Stalls, MD;  Location: [facility];  Service: Ophthalmology;  Laterality: Left;  . OOPHORECTOMY    . PARS PLANA VITRECTOMY Left 08/16/2014   Procedure: PARS PLANA VITRECTOMY WITH 25 GAUGE;  Surgeon: Sherlynn Stalls, MD;  Location: [facility];  Service: Ophthalmology;  Laterality: Left;  . TONSILLECTOMY    . WHIPPLE PROCEDURE      REVIEW OF SYSTEMS:   Review of Systems  Constitutional:Positive for fatigue.Negative for chills, appetite change, and weight loss,and fever HENT: Negative for mouth sores, nosebleeds, sore throat and trouble swallowing.  Eyes: Negative for eye problems and icterus.  Respiratory:Positive for cough and shortness of breath (improving).Negative for hemoptysis and wheezing.  Cardiovascular:Negative for leg swelling.  Gastrointestinal:Positive for mild nausea. Negative for abdominal pain, constipation, diarrhea, and vomiting.  Genitourinary: Negative for bladder incontinence, difficulty urinating, dysuria, frequency and hematuria.  Musculoskeletal:Negative for gait problem, neck pain and neck stiffness.  Skin: Negative for itching and rash.  Neurological: Negative for dizziness, extremity weakness, gait problem, headaches, light-headedness and seizures.  Hematological: Negative for adenopathy. Does not bruise/bleed easily.  Psychiatric/Behavioral: Negative for confusion, depression and sleep disturbance. The patient is not nervous/anxious.     PHYSICAL  EXAMINATION:  Blood pressure 121/71, pulse 69, temperature 97.8 F (36.6 C), temperature source Tympanic, resp. rate 18, height 5' 2.5" (1.588 m), weight 102 lb 12.8 oz (46.6 kg), SpO2 99 %.  ECOG PERFORMANCE STATUS: 1 - Symptomatic but completely ambulatory  Physical Exam  Constitutional: Oriented to person, place, and time and cachetic appearing female and in no distress.  HENT:  Head: Normocephalic and atraumatic. neurofibromatosis associated lesions. Mouth/Throat: Oropharynx is clear and moist. No oropharyngeal exudate.  Eyes: Conjunctivae are normal. Right eye exhibits no discharge. Left eye exhibits no discharge. No scleral icterus.  Neck: Normal range of motion. Neck supple.  Cardiovascular: Normal rate, regular rhythm, normal heart sounds and intact distal pulses.   Pulmonary/Chest: Effort normal and breath sounds normal. No respiratory distress. No wheezes. No rales.  Abdominal: Soft. Bowel sounds are normal. Exhibits no distension and no mass. There is no tenderness.  Musculoskeletal: Normal range of motion. Exhibits no edema.  Lymphadenopathy:    No cervical adenopathy.  Neurological: Alert and oriented to person, place, and time. Exhibits muscle wasting. Examined in the wheelchair.  Skin: Skin is warm and dry. No rash noted. Not diaphoretic. No erythema. No pallor.  Psychiatric: Mood, memory and judgment  normal.  Vitals reviewed.  LABORATORY DATA: Lab Results  Component Value Date   WBC 8.7 01/13/2020   HGB 10.7 (L) 01/13/2020   HCT 32.2 (L) 01/13/2020   MCV 85.2 01/13/2020   PLT 563 (H) 01/13/2020      Chemistry      Component Value Date/Time   NA 136 01/13/2020 0930   K 4.3 01/13/2020 0930   CL 105 01/13/2020 0930   CO2 26 01/13/2020 0930   BUN 13 01/13/2020 0930   CREATININE 0.65 01/13/2020 0930   CREATININE 0.63 07/11/2011 1016      Component Value Date/Time   CALCIUM 9.0 01/13/2020 0930   ALKPHOS 72 01/13/2020 0930   AST 17 01/13/2020 0930   ALT 16  01/13/2020 0930   BILITOT <0.2 (L) 01/13/2020 0930       RADIOGRAPHIC STUDIES:  CT Chest W Contrast  Result Date: 01/11/2020 CLINICAL DATA:  Restaging small cell lung cancer diagnosed 3 months ago. Chemotherapy in progress. EXAM: CT CHEST, ABDOMEN, AND PELVIS WITH CONTRAST TECHNIQUE: Multidetector CT imaging of the chest, abdomen and pelvis was performed following the standard protocol during bolus administration of intravenous contrast. CONTRAST:  62mL OMNIPAQUE IOHEXOL 300 MG/ML  SOLN COMPARISON:  CT 11/24/2019 and 11/12/2019.  PET-CT 11/17/2019. FINDINGS: CT CHEST FINDINGS Cardiovascular: Atherosclerosis of the aorta, great vessels and coronary arteries. Right IJ Port-A-Cath extends to the level of the superior right atrium. No acute vascular findings. The heart size is normal. There is no pericardial effusion. Mediastinum/Nodes: Interval improvement in mediastinal and left hilar adenopathy. Residual subcarinal nodal mass as a short axis dimension of 1.5 cm on image 29/2. Left infrahilar mass measures 3.5 x 2.3 cm on image 33/2. No axillary or progressive mediastinal adenopathy. The thyroid gland, trachea and esophagus demonstrate no significant findings. Lungs/Pleura: The left pleural effusion has decreased in volume. Multiple left-sided pleural based masses are again noted consistent with metastatic disease. These have improved compared with the prior studies. The largest residual pleural based masses include a 1.7 x 1.4 cm nodule lateral to the gastroesophageal junction (image 51/2), a 1.7 x 1.4 cm nodule posteriorly on image 54/2 and a 2.3 x 1.6 cm nodule posteromedially on image 57/2. No right-sided pleural effusion or pleural nodularity. Severe centrilobular and paraseptal emphysema again noted with diffuse central airway thickening and partially calcified biapical bulla. The left infrahilar mass has decreased in size and remains contiguous with left hilar and AP window nodal adenopathy. There is  improved aeration of the left lung. No new or enlarging pulmonary nodules. Musculoskeletal/Chest wall: No chest wall mass or suspicious osseous findings. CT ABDOMEN AND PELVIS FINDINGS Hepatobiliary: The liver is normal in density without suspicious focal abnormality. Stable probable small cystic lesion centrally in the right hepatic lobe on image 59/2. Previous cholecystectomy and pneumobilia attributed to reported previous Whipple procedure. No biliary dilatation. Pancreas: Stable postsurgical changes in the pancreatic head consistent with previous Whipple procedure. The pancreatic body and tail appears stable without ductal dilatation or surrounding inflammation. Spleen: Normal in size without focal abnormality. Adrenals/Urinary Tract: Stable 2.7 x 1.8 cm left adrenal nodule on image 59/2 from 2014, consistent with an incidental adenoma. No right adrenal nodule. Several small left renal cysts. No evidence of enhancing renal mass, urinary tract calculus or hydronephrosis. The bladder appears unremarkable. Stomach/Bowel: Postsurgical changes in the stomach and proximal small bowel attributed to previous Whipple procedure, grossly stable. No bowel wall thickening, distention or surrounding inflammation. The appendix appears normal. There are mild diverticular changes  throughout the distal colon without evidence of acute inflammation. Vascular/Lymphatic: Interval improvement in previously demonstrated hypermetabolic upper retroperitoneal lymphadenopathy. There is remaining small node posterior to the left renal artery, measuring 1.3 x 0.9 cm on image 64/2 (previously 1.9 x 1.4 cm). No progressive adenopathy. Aortic and branch vessel atherosclerosis without acute vascular findings. The portal, superior mesenteric and splenic veins are patent. Reproductive: Hysterectomy.  No adnexal mass. Other: Intact abdominal wall. No ascites or peritoneal nodularity. Numerous cutaneous and subcutaneous nodules are present  consistent with neurofibromatosis. Musculoskeletal: No acute or significant osseous findings. Stable degenerative changes in the spine. IMPRESSION: 1. Interval response to therapy with decreased size of left infrahilar mass, improved left pleural metastases and improved mediastinal and upper abdominal adenopathy. No progressive metastatic disease identified. 2. Decreased left pleural effusion with improved aeration of the left lung. 3. 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 4. Stable left adrenal adenoma. 5. Numerous cutaneous and subcutaneous nodules consistent with neurofibromatosis. 6. Aortic Atherosclerosis (ICD10-I70.0) and Emphysema (ICD10-J43.9). Electronically Signed   By: Richardean Sale M.D.   On: 01/11/2020 10:37   CT Abdomen Pelvis W Contrast  Result Date: 01/11/2020 CLINICAL DATA:  Restaging small cell lung cancer diagnosed 3 months ago. Chemotherapy in progress. EXAM: CT CHEST, ABDOMEN, AND PELVIS WITH CONTRAST TECHNIQUE: Multidetector CT imaging of the chest, abdomen and pelvis was performed following the standard protocol during bolus administration of intravenous contrast. CONTRAST:  68mL OMNIPAQUE IOHEXOL 300 MG/ML  SOLN COMPARISON:  CT 11/24/2019 and 11/12/2019.  PET-CT 11/17/2019. FINDINGS: CT CHEST FINDINGS Cardiovascular: Atherosclerosis of the aorta, great vessels and coronary arteries. Right IJ Port-A-Cath extends to the level of the superior right atrium. No acute vascular findings. The heart size is normal. There is no pericardial effusion. Mediastinum/Nodes: Interval improvement in mediastinal and left hilar adenopathy. Residual subcarinal nodal mass as a short axis dimension of 1.5 cm on image 29/2. Left infrahilar mass measures 3.5 x 2.3 cm on image 33/2. No axillary or progressive mediastinal adenopathy. The thyroid gland, trachea and esophagus demonstrate no significant findings. Lungs/Pleura: The left pleural effusion has decreased in volume. Multiple  left-sided pleural based masses are again noted consistent with metastatic disease. These have improved compared with the prior studies. The largest residual pleural based masses include a 1.7 x 1.4 cm nodule lateral to the gastroesophageal junction (image 51/2), a 1.7 x 1.4 cm nodule posteriorly on image 54/2 and a 2.3 x 1.6 cm nodule posteromedially on image 57/2. No right-sided pleural effusion or pleural nodularity. Severe centrilobular and paraseptal emphysema again noted with diffuse central airway thickening and partially calcified biapical bulla. The left infrahilar mass has decreased in size and remains contiguous with left hilar and AP window nodal adenopathy. There is improved aeration of the left lung. No new or enlarging pulmonary nodules. Musculoskeletal/Chest wall: No chest wall mass or suspicious osseous findings. CT ABDOMEN AND PELVIS FINDINGS Hepatobiliary: The liver is normal in density without suspicious focal abnormality. Stable probable small cystic lesion centrally in the right hepatic lobe on image 59/2. Previous cholecystectomy and pneumobilia attributed to reported previous Whipple procedure. No biliary dilatation. Pancreas: Stable postsurgical changes in the pancreatic head consistent with previous Whipple procedure. The pancreatic body and tail appears stable without ductal dilatation or surrounding inflammation. Spleen: Normal in size without focal abnormality. Adrenals/Urinary Tract: Stable 2.7 x 1.8 cm left adrenal nodule on image 59/2 from 2014, consistent with an incidental adenoma. No right adrenal nodule. Several small left renal cysts. No evidence of enhancing renal  mass, urinary tract calculus or hydronephrosis. The bladder appears unremarkable. Stomach/Bowel: Postsurgical changes in the stomach and proximal small bowel attributed to previous Whipple procedure, grossly stable. No bowel wall thickening, distention or surrounding inflammation. The appendix appears normal. There are  mild diverticular changes throughout the distal colon without evidence of acute inflammation. Vascular/Lymphatic: Interval improvement in previously demonstrated hypermetabolic upper retroperitoneal lymphadenopathy. There is remaining small node posterior to the left renal artery, measuring 1.3 x 0.9 cm on image 64/2 (previously 1.9 x 1.4 cm). No progressive adenopathy. Aortic and branch vessel atherosclerosis without acute vascular findings. The portal, superior mesenteric and splenic veins are patent. Reproductive: Hysterectomy.  No adnexal mass. Other: Intact abdominal wall. No ascites or peritoneal nodularity. Numerous cutaneous and subcutaneous nodules are present consistent with neurofibromatosis. Musculoskeletal: No acute or significant osseous findings. Stable degenerative changes in the spine. IMPRESSION: 1. Interval response to therapy with decreased size of left infrahilar mass, improved left pleural metastases and improved mediastinal and upper abdominal adenopathy. No progressive metastatic disease identified. 2. Decreased left pleural effusion with improved aeration of the left lung. 3. 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 4. Stable left adrenal adenoma. 5. Numerous cutaneous and subcutaneous nodules consistent with neurofibromatosis. 6. Aortic Atherosclerosis (ICD10-I70.0) and Emphysema (ICD10-J43.9). Electronically Signed   By: Richardean Sale M.D.   On: 01/11/2020 10:37     ASSESSMENT/PLAN:  This is a very pleasant 64 year old Caucasian female diagnosed with extensive stage small cell lung cancer,the patient presented with a large left central left upper lobe lung mass invading the left hilum and mediastinum with extensive left-sided pleural metastatic disease and a malignant left pleural effusion. The patient had associated upper abdominal lymphadenopathy. She was diagnosed in August2021.  The patient recently had a brain MRI to complete the staging work-up  whichshowed no definite metastatic disease to the brain. Several small developmental venous anomalies are noted, and a punctate area of enhancement in the right cerebellumshich also were felt to be vascular in nature. We will repeat imaging with routine imaging assessing for treatment response.  The patient is currently undergoing palliative systemic chemotherapy with carboplatin for an AUC of 5 on day one, etoposide 100 mg per metered squared on days 1, 2, and 3 and Imfinzi 1500 mg IV on day one IV every 3 weeks.  The patient is also on Cosela for myeloprotection.  She is status post 2 cycles and she tolerated it well except for fatigue.   The patient recently had a restaging CT scan performed. Dr. Julien Nordmann personally and independently reviewed the scan and discussed the results with the patient. The scan showed a positive response to treatment. Dr. Julien Nordmann recommends that she proceed with cycle #3 today as scheduled.   We will see her back for a follow up visit in 3 weeks for evaluation before starting cyclle #4.   She will follow up with the nutritionist team while in the infusion room today. She will continue to work on gaining/maintaining her weight and increasing her protein intake.   She will receive the flu shot while in the infusion room today.   The patient was advised to call immediately if she has any concerning symptoms in the interval. The patient voices understanding of current disease status and treatment options and is in agreement with the current care plan. All questions were answered. The patient knows to call the clinic with any problems, questions or concerns. We can certainly see the patient much sooner if necessary  No orders of  the defined types were placed in this encounter.    Ladonna Vanorder L Aziel Morgan, PA-C 01/13/20  ADDENDUM: Hematology/Oncology Attending: I had a face-to-face encounter with the patient today.  I recommended her care plan.  This is a very  pleasant 64 years old white female diagnosed with extensive stage small cell lung cancer and she is currently undergoing systemic chemotherapy with carboplatin, etoposide, Imfinzi as well as Cosela status post 2 cycles.  The patient is tolerating her treatment fairly well with no concerning adverse effects.  She notes a significant improvement of her breathing and she is not using her oxygen that much these days. She had repeat CT scan of the chest, abdomen pelvis performed recently.  I personally and independently reviewed the scans and discussed the results with the patient and her brother. Her scan showed significant improvement of her disease. I recommended for her to proceed with cycle #3 today as planned. She will come back for follow-up visit in 3 weeks for evaluation before cycle #4 of her chemotherapy. The patient was advised to call immediately if she has any concerning symptoms in the interval.  Disclaimer: This note was dictated with voice recognition software. Similar sounding words can inadvertently be transcribed and may be missed upon review. Eilleen Kempf, MD 01/13/20

## 2020-01-13 ENCOUNTER — Inpatient Hospital Stay (HOSPITAL_BASED_OUTPATIENT_CLINIC_OR_DEPARTMENT_OTHER): Payer: PPO | Admitting: Physician Assistant

## 2020-01-13 ENCOUNTER — Other Ambulatory Visit: Payer: Self-pay

## 2020-01-13 ENCOUNTER — Inpatient Hospital Stay: Payer: PPO

## 2020-01-13 ENCOUNTER — Inpatient Hospital Stay: Payer: PPO | Admitting: Nutrition

## 2020-01-13 ENCOUNTER — Encounter: Payer: Self-pay | Admitting: Physician Assistant

## 2020-01-13 VITALS — BP 121/71 | HR 69 | Temp 97.8°F | Resp 18 | Ht 62.5 in | Wt 102.8 lb

## 2020-01-13 DIAGNOSIS — C3492 Malignant neoplasm of unspecified part of left bronchus or lung: Secondary | ICD-10-CM

## 2020-01-13 DIAGNOSIS — Z5111 Encounter for antineoplastic chemotherapy: Secondary | ICD-10-CM

## 2020-01-13 DIAGNOSIS — Z5112 Encounter for antineoplastic immunotherapy: Secondary | ICD-10-CM

## 2020-01-13 DIAGNOSIS — Z95828 Presence of other vascular implants and grafts: Secondary | ICD-10-CM

## 2020-01-13 LAB — CMP (CANCER CENTER ONLY)
ALT: 16 U/L (ref 0–44)
AST: 17 U/L (ref 15–41)
Albumin: 3 g/dL — ABNORMAL LOW (ref 3.5–5.0)
Alkaline Phosphatase: 72 U/L (ref 38–126)
Anion gap: 5 (ref 5–15)
BUN: 13 mg/dL (ref 8–23)
CO2: 26 mmol/L (ref 22–32)
Calcium: 9 mg/dL (ref 8.9–10.3)
Chloride: 105 mmol/L (ref 98–111)
Creatinine: 0.65 mg/dL (ref 0.44–1.00)
GFR, Est AFR Am: >60 mL/min (ref >60)
GFR, Estimated: >60 mL/min (ref >60)
Glucose, Bld: 99 mg/dL (ref 70–99)
Potassium: 4.3 mmol/L (ref 3.5–5.1)
Sodium: 136 mmol/L (ref 135–145)
Total Bilirubin: <0.2 mg/dL — ABNORMAL LOW (ref 0.3–1.2)
Total Protein: 6.6 g/dL (ref 6.5–8.1)

## 2020-01-13 LAB — CBC WITH DIFFERENTIAL (CANCER CENTER ONLY)
Abs Immature Granulocytes: 0.08 10*3/uL — ABNORMAL HIGH (ref 0.00–0.07)
Basophils Absolute: 0.1 10*3/uL (ref 0.0–0.1)
Basophils Relative: 1 %
Eosinophils Absolute: 0.1 10*3/uL (ref 0.0–0.5)
Eosinophils Relative: 1 %
HCT: 32.2 % — ABNORMAL LOW (ref 36.0–46.0)
Hemoglobin: 10.7 g/dL — ABNORMAL LOW (ref 12.0–15.0)
Immature Granulocytes: 1 %
Lymphocytes Relative: 34 %
Lymphs Abs: 2.9 10*3/uL (ref 0.7–4.0)
MCH: 28.3 pg (ref 26.0–34.0)
MCHC: 33.2 g/dL (ref 30.0–36.0)
MCV: 85.2 fL (ref 80.0–100.0)
Monocytes Absolute: 1.5 10*3/uL — ABNORMAL HIGH (ref 0.1–1.0)
Monocytes Relative: 17 %
Neutro Abs: 3.9 10*3/uL (ref 1.7–7.7)
Neutrophils Relative %: 46 %
Platelet Count: 563 10*3/uL — ABNORMAL HIGH (ref 150–400)
RBC: 3.78 MIL/uL — ABNORMAL LOW (ref 3.87–5.11)
RDW: 16.9 % — ABNORMAL HIGH (ref 11.5–15.5)
WBC Count: 8.7 10*3/uL (ref 4.0–10.5)
nRBC: 0 % (ref 0.0–0.2)

## 2020-01-13 LAB — TSH: TSH: 2.269 u[IU]/mL (ref 0.308–3.960)

## 2020-01-13 MED ORDER — INFLUENZA VAC A&B SA ADJ QUAD 0.5 ML IM PRSY
PREFILLED_SYRINGE | INTRAMUSCULAR | Status: AC
  Filled 2020-01-13: qty 0.5

## 2020-01-13 MED ORDER — INFLUENZA VAC A&B SA ADJ QUAD 0.5 ML IM PRSY
0.5000 mL | PREFILLED_SYRINGE | Freq: Once | INTRAMUSCULAR | Status: AC
  Administered 2020-01-13: 0.5 mL via INTRAMUSCULAR

## 2020-01-13 MED ORDER — SODIUM CHLORIDE 0.9 % IV SOLN
350.0000 mg | Freq: Once | INTRAVENOUS | Status: AC
  Administered 2020-01-13: 350 mg via INTRAVENOUS
  Filled 2020-01-13: qty 35

## 2020-01-13 MED ORDER — SODIUM CHLORIDE 0.9 % IV SOLN
90.0000 mg/m2 | Freq: Once | INTRAVENOUS | Status: AC
  Administered 2020-01-13: 140 mg via INTRAVENOUS
  Filled 2020-01-13: qty 7

## 2020-01-13 MED ORDER — SODIUM CHLORIDE 0.9% FLUSH
10.0000 mL | INTRAVENOUS | Status: DC | PRN
  Administered 2020-01-13: 10 mL
  Filled 2020-01-13: qty 10

## 2020-01-13 MED ORDER — HEPARIN SOD (PORK) LOCK FLUSH 100 UNIT/ML IV SOLN
500.0000 [IU] | Freq: Once | INTRAVENOUS | Status: AC | PRN
  Administered 2020-01-13: 500 [IU]
  Filled 2020-01-13: qty 5

## 2020-01-13 MED ORDER — SODIUM CHLORIDE 0.9 % IV SOLN
1500.0000 mg | Freq: Once | INTRAVENOUS | Status: AC
  Administered 2020-01-13: 1500 mg via INTRAVENOUS
  Filled 2020-01-13: qty 30

## 2020-01-13 MED ORDER — SODIUM CHLORIDE 0.9 % IV SOLN
Freq: Once | INTRAVENOUS | Status: AC
  Filled 2020-01-13: qty 250

## 2020-01-13 MED ORDER — SODIUM CHLORIDE 0.9% FLUSH
10.0000 mL | INTRAVENOUS | Status: DC | PRN
  Administered 2020-01-13: 10 mL via INTRAVENOUS
  Filled 2020-01-13: qty 10

## 2020-01-13 MED ORDER — TRILACICLIB DIHYDROCHLORIDE INJECTION 300 MG
240.0000 mg/m2 | Freq: Once | INTRAVENOUS | Status: AC
  Administered 2020-01-13: 375 mg via INTRAVENOUS
  Filled 2020-01-13: qty 25

## 2020-01-13 MED ORDER — PALONOSETRON HCL INJECTION 0.25 MG/5ML
0.2500 mg | Freq: Once | INTRAVENOUS | Status: AC
  Administered 2020-01-13: 0.25 mg via INTRAVENOUS

## 2020-01-13 MED ORDER — SODIUM CHLORIDE 0.9 % IV SOLN
10.0000 mg | Freq: Once | INTRAVENOUS | Status: AC
  Administered 2020-01-13: 10 mg via INTRAVENOUS
  Filled 2020-01-13: qty 10

## 2020-01-13 MED ORDER — PALONOSETRON HCL INJECTION 0.25 MG/5ML
INTRAVENOUS | Status: AC
  Filled 2020-01-13: qty 5

## 2020-01-13 MED ORDER — SODIUM CHLORIDE 0.9 % IV SOLN
150.0000 mg | Freq: Once | INTRAVENOUS | Status: AC
  Administered 2020-01-13: 150 mg via INTRAVENOUS
  Filled 2020-01-13: qty 150

## 2020-01-13 NOTE — Patient Instructions (Signed)
[facility] Discharge Instructions for Patients Receiving Chemotherapy  Today you received the following immunotherapy agent - Imfinzi.  And chemotherapy agents - Carboplatin and Etoposide  To help prevent nausea and vomiting after your treatment, we encourage you to take your nausea medication as prescribed.   If you develop nausea and vomiting that is not controlled by your nausea medication, call the clinic.   BELOW ARE SYMPTOMS THAT SHOULD BE REPORTED IMMEDIATELY:  *FEVER GREATER THAN 100.5 F  *CHILLS WITH OR WITHOUT FEVER  NAUSEA AND VOMITING THAT IS NOT CONTROLLED WITH YOUR NAUSEA MEDICATION  *UNUSUAL SHORTNESS OF BREATH  *UNUSUAL BRUISING OR BLEEDING  TENDERNESS IN MOUTH AND THROAT WITH OR WITHOUT PRESENCE OF ULCERS  *URINARY PROBLEMS  *BOWEL PROBLEMS  UNUSUAL RASH Items with * indicate a potential emergency and should be followed up as soon as possible.  Feel free to call the clinic should you have any questions or concerns. The clinic phone number is [phone].  Please show the [facility] at check-in to the Emergency Department and triage nurse.

## 2020-01-13 NOTE — Patient Instructions (Signed)

## 2020-01-13 NOTE — Progress Notes (Signed)
Adjust etoposide with new BSA: 1.43 m2   All other chemo within 10% will assess with next OV.  T.O. Cassandra Heilingoetter, PAC/Chiara Coltrin Ronnald Ramp, PharmD

## 2020-01-13 NOTE — Progress Notes (Signed)
Nutrition follow-up completed with patient during infusion for small cell lung cancer.   Weight documented as 102.8 pounds September 22 stable from 102.4 pounds September 1. Patient reports occasional nausea but no vomiting. Denies diarrhea or constipation. Reports she is eating more and does not limit types of food or amount of food.  She has a cooler of snacks with her today. She continues to drink an oral nutrition supplement with ice cream every other day.  Nutrition diagnosis: Unintended weight loss is stable.  Intervention: Patient educated to continue strategies for choosing high-calorie, high-protein foods. Encouraged oral nutrition supplements at least once daily. Continue high-calorie, high-protein foods. Continue nausea medication to control nausea for increased oral intake. Questions were answered.  Monitoring, evaluation, goals: Patient will tolerate increased calories and protein to promote weight gain/weight stabilization.  Next visit: To be scheduled with upcoming treatment as needed.  Patient has my contact information.  **Disclaimer: This note was dictated with voice recognition software. Similar sounding words can inadvertently be transcribed and this note may contain transcription errors which may not have been corrected upon publication of note.**

## 2020-01-14 ENCOUNTER — Inpatient Hospital Stay: Payer: PPO

## 2020-01-14 ENCOUNTER — Telehealth: Payer: Self-pay | Admitting: Physician Assistant

## 2020-01-14 ENCOUNTER — Other Ambulatory Visit: Payer: Self-pay

## 2020-01-14 ENCOUNTER — Encounter: Payer: Self-pay | Admitting: Family Medicine

## 2020-01-14 VITALS — BP 135/66 | HR 73 | Temp 98.0°F | Resp 20

## 2020-01-14 DIAGNOSIS — Z5111 Encounter for antineoplastic chemotherapy: Secondary | ICD-10-CM | POA: Diagnosis not present

## 2020-01-14 DIAGNOSIS — C3492 Malignant neoplasm of unspecified part of left bronchus or lung: Secondary | ICD-10-CM

## 2020-01-14 MED ORDER — SODIUM CHLORIDE 0.9 % IV SOLN
10.0000 mg | Freq: Once | INTRAVENOUS | Status: AC
  Administered 2020-01-14: 10 mg via INTRAVENOUS
  Filled 2020-01-14: qty 10

## 2020-01-14 MED ORDER — TRILACICLIB DIHYDROCHLORIDE INJECTION 300 MG
240.0000 mg/m2 | Freq: Once | INTRAVENOUS | Status: AC
  Administered 2020-01-14: 450 mg via INTRAVENOUS
  Filled 2020-01-14: qty 30

## 2020-01-14 MED ORDER — SODIUM CHLORIDE 0.9 % IV SOLN
Freq: Once | INTRAVENOUS | Status: AC
  Filled 2020-01-14: qty 250

## 2020-01-14 MED ORDER — SODIUM CHLORIDE 0.9 % IV SOLN
100.0000 mg/m2 | Freq: Once | INTRAVENOUS | Status: AC
  Administered 2020-01-14: 140 mg via INTRAVENOUS
  Filled 2020-01-14: qty 7

## 2020-01-14 MED ORDER — HEPARIN SOD (PORK) LOCK FLUSH 100 UNIT/ML IV SOLN
500.0000 [IU] | Freq: Once | INTRAVENOUS | Status: AC | PRN
  Administered 2020-01-14: 500 [IU]
  Filled 2020-01-14: qty 5

## 2020-01-14 MED ORDER — SODIUM CHLORIDE 0.9% FLUSH
10.0000 mL | INTRAVENOUS | Status: DC | PRN
  Administered 2020-01-14: 10 mL
  Filled 2020-01-14: qty 10

## 2020-01-14 NOTE — Telephone Encounter (Signed)
Scheduled appointments per 9/22 los. Will have updated calendar printed for patient at next visit.

## 2020-01-14 NOTE — Telephone Encounter (Signed)
Correct -- she has not had prevnar yet She has had pneum 23

## 2020-01-14 NOTE — Patient Instructions (Signed)
[facility] Discharge Instructions for Patients Receiving Chemotherapy  Today you received the following chemotherapy agent: Etoposide  To help prevent nausea and vomiting after your treatment, we encourage you to take your nausea medication as directed by your MD.   If you develop nausea and vomiting that is not controlled by your nausea medication, call the clinic.   BELOW ARE SYMPTOMS THAT SHOULD BE REPORTED IMMEDIATELY:  *FEVER GREATER THAN 100.5 F  *CHILLS WITH OR WITHOUT FEVER  NAUSEA AND VOMITING THAT IS NOT CONTROLLED WITH YOUR NAUSEA MEDICATION  *UNUSUAL SHORTNESS OF BREATH  *UNUSUAL BRUISING OR BLEEDING  TENDERNESS IN MOUTH AND THROAT WITH OR WITHOUT PRESENCE OF ULCERS  *URINARY PROBLEMS  *BOWEL PROBLEMS  UNUSUAL RASH Items with * indicate a potential emergency and should be followed up as soon as possible.  Feel free to call the clinic should you have any questions or concerns. The clinic phone number is [phone].  Please show the CHEMO ALERT CARD at check-in to the Emergency Department and triage nurse.   

## 2020-01-15 ENCOUNTER — Inpatient Hospital Stay: Payer: PPO

## 2020-01-15 ENCOUNTER — Other Ambulatory Visit: Payer: Self-pay

## 2020-01-15 ENCOUNTER — Encounter: Payer: Self-pay | Admitting: Internal Medicine

## 2020-01-15 VITALS — BP 146/76 | HR 81 | Resp 18

## 2020-01-15 DIAGNOSIS — C3492 Malignant neoplasm of unspecified part of left bronchus or lung: Secondary | ICD-10-CM

## 2020-01-15 DIAGNOSIS — Z5111 Encounter for antineoplastic chemotherapy: Secondary | ICD-10-CM | POA: Diagnosis not present

## 2020-01-15 MED ORDER — SODIUM CHLORIDE 0.9% FLUSH
10.0000 mL | INTRAVENOUS | Status: DC | PRN
  Administered 2020-01-15: 10 mL
  Filled 2020-01-15: qty 10

## 2020-01-15 MED ORDER — TRILACICLIB DIHYDROCHLORIDE INJECTION 300 MG: 240.0000 mg/m2 | Freq: Once | INTRAVENOUS | Status: DC

## 2020-01-15 MED ORDER — SODIUM CHLORIDE 0.9 % IV SOLN
Freq: Once | INTRAVENOUS | Status: AC
  Filled 2020-01-15: qty 250

## 2020-01-15 MED ORDER — SODIUM CHLORIDE 0.9 % IV SOLN
10.0000 mg | Freq: Once | INTRAVENOUS | Status: AC
  Administered 2020-01-15: 10 mg via INTRAVENOUS
  Filled 2020-01-15: qty 10

## 2020-01-15 MED ORDER — HEPARIN SOD (PORK) LOCK FLUSH 100 UNIT/ML IV SOLN
500.0000 [IU] | Freq: Once | INTRAVENOUS | Status: AC | PRN
  Administered 2020-01-15: 500 [IU]
  Filled 2020-01-15: qty 5

## 2020-01-15 MED ORDER — SODIUM CHLORIDE 0.9 % IV SOLN
100.0000 mg/m2 | Freq: Once | INTRAVENOUS | Status: AC
  Administered 2020-01-15: 140 mg via INTRAVENOUS
  Filled 2020-01-15: qty 7

## 2020-01-15 MED ORDER — TRILACICLIB DIHYDROCHLORIDE INJECTION 300 MG
240.0000 mg/m2 | Freq: Once | INTRAVENOUS | Status: AC
  Administered 2020-01-15: 345 mg via INTRAVENOUS
  Filled 2020-01-15: qty 23

## 2020-01-15 NOTE — Patient Instructions (Signed)
[facility] Discharge Instructions for Patients Receiving Chemotherapy  Today you received the following chemotherapy agent: Etoposide  To help prevent nausea and vomiting after your treatment, we encourage you to take your nausea medication as directed by your MD.   If you develop nausea and vomiting that is not controlled by your nausea medication, call the clinic.   BELOW ARE SYMPTOMS THAT SHOULD BE REPORTED IMMEDIATELY:  *FEVER GREATER THAN 100.5 F  *CHILLS WITH OR WITHOUT FEVER  NAUSEA AND VOMITING THAT IS NOT CONTROLLED WITH YOUR NAUSEA MEDICATION  *UNUSUAL SHORTNESS OF BREATH  *UNUSUAL BRUISING OR BLEEDING  TENDERNESS IN MOUTH AND THROAT WITH OR WITHOUT PRESENCE OF ULCERS  *URINARY PROBLEMS  *BOWEL PROBLEMS  UNUSUAL RASH Items with * indicate a potential emergency and should be followed up as soon as possible.  Feel free to call the clinic should you have any questions or concerns. The clinic phone number is [phone].  Please show the CHEMO ALERT CARD at check-in to the Emergency Department and triage nurse.   

## 2020-01-15 NOTE — Progress Notes (Signed)
Cosela dose adjusted for weight loss.  Kennith Center, Pharm.D., CPP 01/15/2020@9 :56 AM

## 2020-01-19 ENCOUNTER — Encounter: Payer: Self-pay | Admitting: Internal Medicine

## 2020-01-20 ENCOUNTER — Inpatient Hospital Stay: Payer: PPO

## 2020-01-20 ENCOUNTER — Other Ambulatory Visit: Payer: Self-pay

## 2020-01-20 DIAGNOSIS — Z95828 Presence of other vascular implants and grafts: Secondary | ICD-10-CM

## 2020-01-20 DIAGNOSIS — Z5111 Encounter for antineoplastic chemotherapy: Secondary | ICD-10-CM | POA: Diagnosis not present

## 2020-01-20 DIAGNOSIS — C3492 Malignant neoplasm of unspecified part of left bronchus or lung: Secondary | ICD-10-CM

## 2020-01-20 LAB — CMP (CANCER CENTER ONLY)
ALT: 21 U/L (ref 0–44)
AST: 22 U/L (ref 15–41)
Albumin: 3.2 g/dL — ABNORMAL LOW (ref 3.5–5.0)
Alkaline Phosphatase: 74 U/L (ref 38–126)
Anion gap: 4 — ABNORMAL LOW (ref 5–15)
BUN: 15 mg/dL (ref 8–23)
CO2: 27 mmol/L (ref 22–32)
Calcium: 9 mg/dL (ref 8.9–10.3)
Chloride: 102 mmol/L (ref 98–111)
Creatinine: 0.65 mg/dL (ref 0.44–1.00)
GFR, Est AFR Am: >60 mL/min (ref >60)
GFR, Estimated: >60 mL/min (ref >60)
Glucose, Bld: 99 mg/dL (ref 70–99)
Potassium: 4.8 mmol/L (ref 3.5–5.1)
Sodium: 133 mmol/L — ABNORMAL LOW (ref 135–145)
Total Bilirubin: 0.3 mg/dL (ref 0.3–1.2)
Total Protein: 7 g/dL (ref 6.5–8.1)

## 2020-01-20 LAB — CBC WITH DIFFERENTIAL (CANCER CENTER ONLY)
Abs Immature Granulocytes: 0.13 10*3/uL — ABNORMAL HIGH (ref 0.00–0.07)
Basophils Absolute: 0.1 10*3/uL (ref 0.0–0.1)
Basophils Relative: 1 %
Eosinophils Absolute: 0.2 10*3/uL (ref 0.0–0.5)
Eosinophils Relative: 1 %
HCT: 34 % — ABNORMAL LOW (ref 36.0–46.0)
Hemoglobin: 11.1 g/dL — ABNORMAL LOW (ref 12.0–15.0)
Immature Granulocytes: 1 %
Lymphocytes Relative: 24 %
Lymphs Abs: 2.8 10*3/uL (ref 0.7–4.0)
MCH: 27.4 pg (ref 26.0–34.0)
MCHC: 32.6 g/dL (ref 30.0–36.0)
MCV: 84 fL (ref 80.0–100.0)
Monocytes Absolute: 0.3 10*3/uL (ref 0.1–1.0)
Monocytes Relative: 2 %
Neutro Abs: 8.3 10*3/uL — ABNORMAL HIGH (ref 1.7–7.7)
Neutrophils Relative %: 71 %
Platelet Count: 441 10*3/uL — ABNORMAL HIGH (ref 150–400)
RBC: 4.05 MIL/uL (ref 3.87–5.11)
RDW: 16.2 % — ABNORMAL HIGH (ref 11.5–15.5)
WBC Count: 11.7 10*3/uL — ABNORMAL HIGH (ref 4.0–10.5)
nRBC: 0 % (ref 0.0–0.2)

## 2020-01-20 MED ORDER — HEPARIN SOD (PORK) LOCK FLUSH 100 UNIT/ML IV SOLN
500.0000 [IU] | Freq: Once | INTRAVENOUS | Status: AC
  Administered 2020-01-20: 500 [IU] via INTRAVENOUS
  Filled 2020-01-20: qty 5

## 2020-01-20 MED ORDER — SODIUM CHLORIDE 0.9% FLUSH
10.0000 mL | INTRAVENOUS | Status: DC | PRN
  Administered 2020-01-20: 10 mL via INTRAVENOUS
  Filled 2020-01-20: qty 10

## 2020-01-20 NOTE — Patient Instructions (Signed)

## 2020-01-26 ENCOUNTER — Other Ambulatory Visit: Payer: Self-pay | Admitting: Internal Medicine

## 2020-01-26 DIAGNOSIS — Z5111 Encounter for antineoplastic chemotherapy: Secondary | ICD-10-CM

## 2020-01-27 ENCOUNTER — Inpatient Hospital Stay: Payer: PPO | Attending: Physician Assistant

## 2020-01-27 ENCOUNTER — Other Ambulatory Visit: Payer: Self-pay

## 2020-01-27 ENCOUNTER — Inpatient Hospital Stay: Payer: PPO

## 2020-01-27 DIAGNOSIS — Z79899 Other long term (current) drug therapy: Secondary | ICD-10-CM | POA: Insufficient documentation

## 2020-01-27 DIAGNOSIS — Z86711 Personal history of pulmonary embolism: Secondary | ICD-10-CM | POA: Insufficient documentation

## 2020-01-27 DIAGNOSIS — C3412 Malignant neoplasm of upper lobe, left bronchus or lung: Secondary | ICD-10-CM | POA: Diagnosis not present

## 2020-01-27 DIAGNOSIS — Z5111 Encounter for antineoplastic chemotherapy: Secondary | ICD-10-CM | POA: Diagnosis not present

## 2020-01-27 DIAGNOSIS — Z95828 Presence of other vascular implants and grafts: Secondary | ICD-10-CM

## 2020-01-27 DIAGNOSIS — C3492 Malignant neoplasm of unspecified part of left bronchus or lung: Secondary | ICD-10-CM

## 2020-01-27 LAB — CMP (CANCER CENTER ONLY)
ALT: 28 U/L (ref 0–44)
AST: 26 U/L (ref 15–41)
Albumin: 3.2 g/dL — ABNORMAL LOW (ref 3.5–5.0)
Alkaline Phosphatase: 71 U/L (ref 38–126)
Anion gap: 9 (ref 5–15)
BUN: 13 mg/dL (ref 8–23)
CO2: 24 mmol/L (ref 22–32)
Calcium: 9.8 mg/dL (ref 8.9–10.3)
Chloride: 104 mmol/L (ref 98–111)
Creatinine: 0.62 mg/dL (ref 0.44–1.00)
GFR, Estimated: >60 mL/min (ref >60)
Glucose, Bld: 97 mg/dL (ref 70–99)
Potassium: 4.2 mmol/L (ref 3.5–5.1)
Sodium: 137 mmol/L (ref 135–145)
Total Bilirubin: 0.4 mg/dL (ref 0.3–1.2)
Total Protein: 7.3 g/dL (ref 6.5–8.1)

## 2020-01-27 LAB — CBC WITH DIFFERENTIAL (CANCER CENTER ONLY)
Abs Immature Granulocytes: 0.06 10*3/uL (ref 0.00–0.07)
Basophils Absolute: 0.1 10*3/uL (ref 0.0–0.1)
Basophils Relative: 1 %
Eosinophils Absolute: 0 10*3/uL (ref 0.0–0.5)
Eosinophils Relative: 0 %
HCT: 33.1 % — ABNORMAL LOW (ref 36.0–46.0)
Hemoglobin: 11 g/dL — ABNORMAL LOW (ref 12.0–15.0)
Immature Granulocytes: 1 %
Lymphocytes Relative: 20 %
Lymphs Abs: 2.5 10*3/uL (ref 0.7–4.0)
MCH: 28.4 pg (ref 26.0–34.0)
MCHC: 33.2 g/dL (ref 30.0–36.0)
MCV: 85.5 fL (ref 80.0–100.0)
Monocytes Absolute: 2 10*3/uL — ABNORMAL HIGH (ref 0.1–1.0)
Monocytes Relative: 16 %
Neutro Abs: 7.6 10*3/uL (ref 1.7–7.7)
Neutrophils Relative %: 62 %
Platelet Count: 261 10*3/uL (ref 150–400)
RBC: 3.87 MIL/uL (ref 3.87–5.11)
RDW: 17.3 % — ABNORMAL HIGH (ref 11.5–15.5)
WBC Count: 12.3 10*3/uL — ABNORMAL HIGH (ref 4.0–10.5)
nRBC: 0 % (ref 0.0–0.2)

## 2020-01-27 MED ORDER — HEPARIN SOD (PORK) LOCK FLUSH 100 UNIT/ML IV SOLN
500.0000 [IU] | Freq: Once | INTRAVENOUS | Status: AC
  Administered 2020-01-27: 500 [IU] via INTRAVENOUS
  Filled 2020-01-27: qty 5

## 2020-01-27 MED ORDER — SODIUM CHLORIDE 0.9% FLUSH
10.0000 mL | INTRAVENOUS | Status: DC | PRN
  Administered 2020-01-27: 10 mL via INTRAVENOUS
  Filled 2020-01-27: qty 10

## 2020-01-27 NOTE — Patient Instructions (Signed)

## 2020-01-29 ENCOUNTER — Encounter: Payer: Self-pay | Admitting: Family Medicine

## 2020-01-29 MED ORDER — TRAZODONE HCL 50 MG PO TABS: 50.0000 mg | ORAL_TABLET | Freq: Every evening | ORAL | 3 refills | Status: AC | PRN

## 2020-01-30 NOTE — Progress Notes (Signed)
[facility] OFFICE PROGRESS NOTE  [name] Held, DO [address] [place] [address]  DIAGNOSIS: Extensive stage small cell lung cancer,the patient presented with a large left central left upper lobe lung mass invading the left hilum and mediastinum with extensive left-sided pleural metastatic disease and a malignant left pleural effusion. The patient had associated upper abdominal lymphadenopathy. She was diagnosed in August2021.  PRIOR THERAPY: None  CURRENT THERAPY: Palliative systemic chemotherapy with carboplatin for an AUC of 5 on day 1, and etoposide 100 mg/m on days 1, 2, and 3 and Imfinzi 1500 mg IV every 3 weeks withCosela for myeloprotection. First dose expected on 12/02/2019.Status post 3 cycles.   INTERVAL HISTORY: [name] [name] 64 y.o. female returns to the clinic today for a follow up visit. The patient is feeling fairly well today without any concerning complaints except for left lower rib pain/LUQ pain. The pain often starts a few weeks after chemotherapy and she has had this pain in the past.  Her most recent episode of this pain started on Sunday and has been constant ever since.  The patient characterizes the discomfort as a pressure and feeling swollen.  She rates her pain a 5 or 6 out of 10.  Today she rates her pain a 4/10 today. She takes norco for pain and is requesting a refill. Of note, she does have a history of a malignant pleural effusion that was drained when she was first diagnosed with lung cancer. She also have extensive left sided pleural metastatic disease. She reports a dry cough. She has been off oxygen supplementation for about 1 month or so. Her oxygen saturation is 98% on room air.  Otherwise, she has been tolerating her treatment with chemotherapy/immunotherapy well without any concerning adverse side effects. Denies any fever, chills, or night sweats. She is followed by a member of the nutritionist team for her  weight loss. Her weight is up 2 lbs today. She quit smoking a few months ago. She denies any hemoptysis. Denies any diarrhea or constipation but reports mild nausea and vomiting x1 on Sunday. Denies any headache or visual changes. Denies any rashes or skin changes. She is here for evaluation before starting cycle #4.    MEDICAL HISTORY: Past Medical History:  Diagnosis Date  . Allergy   . 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 Carcinoid tumor of abdomen   . Cataract    both eyes  . Family history of adverse reaction to anesthesia    Mother had trouble waking up  . Hypertension   . Lower back pain   . Neurofibromatosis, type I (von Recklinghausen's disease) ([place])   . Pneumothorax on left   . Pneumothorax on right     ALLERGIES:  has No Known Allergies.  MEDICATIONS:  Current Outpatient Medications  Medication Sig Dispense Refill  . albuterol (VENTOLIN HFA) 108 (90 Base) MCG/ACT inhaler Inhale 2 puffs into the lungs every 6 (six) hours as needed for wheezing or shortness of breath. (Patient not taking: Reported on 01/13/2020) 18 g 6  . AYR SALINE NASAL NA Place 1 spray into the nose daily as needed (dryness). (Patient not taking: Reported on 01/13/2020)    . Cholecalciferol (D3) 50 MCG (2000 UT) TABS Take 2,000 Units by mouth daily with breakfast.     . HYDROcodone-acetaminophen (NORCO/VICODIN) 5-325 MG tablet Take 1 tablet by mouth every 6 (six) hours as needed for moderate pain. 30 tablet  0  . lidocaine-prilocaine (EMLA) cream Apply to the Port-A-Cath site 30-60 minutes before treatment. (Patient not taking: Reported on 01/13/2020) 30 g 0  . metoprolol tartrate (LOPRESSOR) 25 MG tablet Take 0.5 tablets (12.5 mg total) by mouth 2 (two) times daily. 90 tablet 3  . Multiple Vitamins-Minerals (MULTIVITAMINS THER. W/MINERALS) TABS Take 1 tablet by mouth daily.    . prochlorperazine (COMPAZINE) 10 MG tablet TAKE 1 TABLET BY MOUTH EVERY 6 HOURS AS NEEDED FOR NAUSEA FOR VOMITING 30 tablet 0  .  Tiotropium Bromide Monohydrate (SPIRIVA RESPIMAT) 2.5 MCG/ACT AERS Inhale 2 puffs into the lungs daily. 4 g 5  . traZODone (DESYREL) 50 MG tablet Take 1 tablet (50 mg total) by mouth at bedtime as needed for sleep. 30 tablet 3   No current facility-administered medications for this visit.   Facility-Administered Medications Ordered in Other Visits  Medication Dose Route Frequency Provider Last Rate Last Admin  . CARBOplatin (PARAPLATIN) 330 mg in sodium chloride 0.9 % 250 mL chemo infusion  330 mg Intravenous Once Curt Bears, MD      . durvalumab (IMFINZI) 1,500 mg in sodium chloride 0.9 % 100 mL chemo infusion  1,500 mg Intravenous Once Curt Bears, MD      . etoposide (VEPESID) 140 mg in sodium chloride 0.9 % 500 mL chemo infusion  100 mg/m2 (Order-Specific) Intravenous Once Curt Bears, MD      . heparin lock flush 100 unit/mL  500 Units Intracatheter Once PRN Curt Bears, MD      . sodium chloride flush (NS) 0.9 % injection 10 mL  10 mL Intracatheter PRN Curt Bears, MD      . trilaciclib dihydrochloride (COSELA) 375 mg in dextrose 5 % 250 mL (1.3636 mg/mL) infusion  240 mg/m2 (Order-Specific) Intravenous Once Curt Bears, MD 550 mL/hr at 02/03/20 1208 375 mg at 02/03/20 1208    SURGICAL HISTORY:  Past Surgical History:  Procedure Laterality Date  . ABDOMINAL HYSTERECTOMY  04/23/1997   fibroids  TAH/ BSO  . CHOLECYSTECTOMY    . COLONOSCOPY  2006  . EYE SURGERY Left 11/2015   cataract and astigmatism surgery with Intraocular Lens left eye  . IR IMAGING GUIDED PORT INSERTION  12/07/2019  . LASIK    . LUNG SURGERY  2008?   d/t pneumothroax  . MEMBRANE PEEL Left 08/16/2014   Procedure: MEMBRANE PEEL LEFT EYE ;  Surgeon: Sherlynn Stalls, MD;  Location: [facility];  Service: Ophthalmology;  Laterality: Left;  . OOPHORECTOMY    . PARS PLANA VITRECTOMY Left 08/16/2014   Procedure: PARS PLANA VITRECTOMY WITH 25 GAUGE;  Surgeon: Sherlynn Stalls, MD;  Location: [facility];   Service: Ophthalmology;  Laterality: Left;  . TONSILLECTOMY    . WHIPPLE PROCEDURE      REVIEW OF SYSTEMS:   Review of Systems  Constitutional:Positive for fatigue.Negative for chills, appetite change, and weight loss,and fever HENT: Negative for mouth sores, nosebleeds, sore throat and trouble swallowing.  Eyes: Negative for eye problems and icterus.  Respiratory:Positive for cough and shortness of breath.Negative for hemoptysis and wheezing.  Cardiovascular:Positive for left lower rib pain. Negative for leg swelling.  Gastrointestinal:Positive for nausea/vomiting x1 on Sunday. Positive for LUQ/Left lower rib pain. Negative for constipationdiarrhea. Genitourinary: Negative for bladder incontinence, difficulty urinating, dysuria, frequency and hematuria.  Musculoskeletal:Negative for gait problem, neck pain and neck stiffness.  Skin: Negative for itching and rash.  Neurological: Negative for dizziness, extremity weakness, gait problem, headaches, light-headedness and seizures.  Hematological: Negative for adenopathy. Does  not bruise/bleed easily.  Psychiatric/Behavioral: Negative for confusion, depression and sleep disturbance. The patient is not nervous/anxious.   PHYSICAL EXAMINATION:  Blood pressure 140/76, pulse 90, temperature 97.8 F (36.6 C), temperature source Tympanic, resp. rate 18, height 5' 2.5" (1.588 m), weight 104 lb 9.6 oz (47.4 kg), SpO2 98 %.  ECOG PERFORMANCE STATUS: 2 - Symptomatic, <50% confined to bed  Physical Exam  Constitutional: Oriented to person, place, and time and cachetic appearing female and in no distress.  HENT:  Head: Normocephalic and atraumatic. neurofibromatosis associated lesions. Mouth/Throat: Oropharynx is clear and moist. No oropharyngeal exudate.  Eyes: Conjunctivae are normal. Right eye exhibits no discharge. Left eye exhibits no discharge. No scleral icterus.  Neck: Normal range of motion. Neck supple.  Cardiovascular:  Normal rate, regular rhythm, normal heart sounds and intact distal pulses.   Pulmonary/Chest: Effort normal. Decreased breath sounds in left lung. No respiratory distress. No wheezes. No rales.  Abdominal: Soft. Bowel sounds are normal. Exhibits no distension and no mass. There is no tenderness.  Musculoskeletal: Normal range of motion. Exhibits no edema.  Lymphadenopathy:    No cervical adenopathy.  Neurological: Alert and oriented to person, place, and time. Exhibits muscle wasting. Examined in the wheelchair.  Skin: Skin is warm and dry. No rash noted. Not diaphoretic. No erythema. No pallor.  Psychiatric: Mood, memory and judgment normal.  Vitals reviewed.  LABORATORY DATA: Lab Results  Component Value Date   WBC 9.9 02/03/2020   HGB 10.7 (L) 02/03/2020   HCT 33.5 (L) 02/03/2020   MCV 87.0 02/03/2020   PLT 583 (H) 02/03/2020      Chemistry      Component Value Date/Time   NA 135 02/03/2020 0930   K 4.4 02/03/2020 0930   CL 103 02/03/2020 0930   CO2 28 02/03/2020 0930   BUN 16 02/03/2020 0930   CREATININE 0.72 02/03/2020 0930   CREATININE 0.63 07/11/2011 1016      Component Value Date/Time   CALCIUM 9.6 02/03/2020 0930   ALKPHOS 85 02/03/2020 0930   AST 24 02/03/2020 0930   ALT 25 02/03/2020 0930   BILITOT <0.2 (L) 02/03/2020 0930       RADIOGRAPHIC STUDIES:  CT Chest W Contrast  Result Date: 01/11/2020 CLINICAL DATA:  Restaging small cell lung cancer diagnosed 3 months ago. Chemotherapy in progress. EXAM: CT CHEST, ABDOMEN, AND PELVIS WITH CONTRAST TECHNIQUE: Multidetector CT imaging of the chest, abdomen and pelvis was performed following the standard protocol during bolus administration of intravenous contrast. CONTRAST:  57mL OMNIPAQUE IOHEXOL 300 MG/ML  SOLN COMPARISON:  CT 11/24/2019 and 11/12/2019.  PET-CT 11/17/2019. FINDINGS: CT CHEST FINDINGS Cardiovascular: Atherosclerosis of the aorta, great vessels and coronary arteries. Right IJ Port-A-Cath extends to  the level of the superior right atrium. No acute vascular findings. The heart size is normal. There is no pericardial effusion. Mediastinum/Nodes: Interval improvement in mediastinal and left hilar adenopathy. Residual subcarinal nodal mass as a short axis dimension of 1.5 cm on image 29/2. Left infrahilar mass measures 3.5 x 2.3 cm on image 33/2. No axillary or progressive mediastinal adenopathy. The thyroid gland, trachea and esophagus demonstrate no significant findings. Lungs/Pleura: The left pleural effusion has decreased in volume. Multiple left-sided pleural based masses are again noted consistent with metastatic disease. These have improved compared with the prior studies. The largest residual pleural based masses include a 1.7 x 1.4 cm nodule lateral to the gastroesophageal junction (image 51/2), a 1.7 x 1.4 cm nodule posteriorly on image 54/2  and a 2.3 x 1.6 cm nodule posteromedially on image 57/2. No right-sided pleural effusion or pleural nodularity. Severe centrilobular and paraseptal emphysema again noted with diffuse central airway thickening and partially calcified biapical bulla. The left infrahilar mass has decreased in size and remains contiguous with left hilar and AP window nodal adenopathy. There is improved aeration of the left lung. No new or enlarging pulmonary nodules. Musculoskeletal/Chest wall: No chest wall mass or suspicious osseous findings. CT ABDOMEN AND PELVIS FINDINGS Hepatobiliary: The liver is normal in density without suspicious focal abnormality. Stable probable small cystic lesion centrally in the right hepatic lobe on image 59/2. Previous cholecystectomy and pneumobilia attributed to reported previous Whipple procedure. No biliary dilatation. Pancreas: Stable postsurgical changes in the pancreatic head consistent with previous Whipple procedure. The pancreatic body and tail appears stable without ductal dilatation or surrounding inflammation. Spleen: Normal in size without  focal abnormality. Adrenals/Urinary Tract: Stable 2.7 x 1.8 cm left adrenal nodule on image 59/2 from 2014, consistent with an incidental adenoma. No right adrenal nodule. Several small left renal cysts. No evidence of enhancing renal mass, urinary tract calculus or hydronephrosis. The bladder appears unremarkable. Stomach/Bowel: Postsurgical changes in the stomach and proximal small bowel attributed to previous Whipple procedure, grossly stable. No bowel wall thickening, distention or surrounding inflammation. The appendix appears normal. There are mild diverticular changes throughout the distal colon without evidence of acute inflammation. Vascular/Lymphatic: Interval improvement in previously demonstrated hypermetabolic upper retroperitoneal lymphadenopathy. There is remaining small node posterior to the left renal artery, measuring 1.3 x 0.9 cm on image 64/2 (previously 1.9 x 1.4 cm). No progressive adenopathy. Aortic and branch vessel atherosclerosis without acute vascular findings. The portal, superior mesenteric and splenic veins are patent. Reproductive: Hysterectomy.  No adnexal mass. Other: Intact abdominal wall. No ascites or peritoneal nodularity. Numerous cutaneous and subcutaneous nodules are present consistent with neurofibromatosis. Musculoskeletal: No acute or significant osseous findings. Stable degenerative changes in the spine. IMPRESSION: 1. Interval response to therapy with decreased size of left infrahilar mass, improved left pleural metastases and improved mediastinal and upper abdominal adenopathy. No progressive metastatic disease identified. 2. Decreased left pleural effusion with improved aeration of the left lung. 3. 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 4. Stable left adrenal adenoma. 5. Numerous cutaneous and subcutaneous nodules consistent with neurofibromatosis. 6. Aortic Atherosclerosis (ICD10-I70.0) and Emphysema (ICD10-J43.9). Electronically Signed   By:  Richardean Sale M.D.   On: 01/11/2020 10:37   CT Abdomen Pelvis W Contrast  Result Date: 01/11/2020 CLINICAL DATA:  Restaging small cell lung cancer diagnosed 3 months ago. Chemotherapy in progress. EXAM: CT CHEST, ABDOMEN, AND PELVIS WITH CONTRAST TECHNIQUE: Multidetector CT imaging of the chest, abdomen and pelvis was performed following the standard protocol during bolus administration of intravenous contrast. CONTRAST:  4mL OMNIPAQUE IOHEXOL 300 MG/ML  SOLN COMPARISON:  CT 11/24/2019 and 11/12/2019.  PET-CT 11/17/2019. FINDINGS: CT CHEST FINDINGS Cardiovascular: Atherosclerosis of the aorta, great vessels and coronary arteries. Right IJ Port-A-Cath extends to the level of the superior right atrium. No acute vascular findings. The heart size is normal. There is no pericardial effusion. Mediastinum/Nodes: Interval improvement in mediastinal and left hilar adenopathy. Residual subcarinal nodal mass as a short axis dimension of 1.5 cm on image 29/2. Left infrahilar mass measures 3.5 x 2.3 cm on image 33/2. No axillary or progressive mediastinal adenopathy. The thyroid gland, trachea and esophagus demonstrate no significant findings. Lungs/Pleura: The left pleural effusion has decreased in volume. Multiple left-sided pleural based masses are  again noted consistent with metastatic disease. These have improved compared with the prior studies. The largest residual pleural based masses include a 1.7 x 1.4 cm nodule lateral to the gastroesophageal junction (image 51/2), a 1.7 x 1.4 cm nodule posteriorly on image 54/2 and a 2.3 x 1.6 cm nodule posteromedially on image 57/2. No right-sided pleural effusion or pleural nodularity. Severe centrilobular and paraseptal emphysema again noted with diffuse central airway thickening and partially calcified biapical bulla. The left infrahilar mass has decreased in size and remains contiguous with left hilar and AP window nodal adenopathy. There is improved aeration of the left  lung. No new or enlarging pulmonary nodules. Musculoskeletal/Chest wall: No chest wall mass or suspicious osseous findings. CT ABDOMEN AND PELVIS FINDINGS Hepatobiliary: The liver is normal in density without suspicious focal abnormality. Stable probable small cystic lesion centrally in the right hepatic lobe on image 59/2. Previous cholecystectomy and pneumobilia attributed to reported previous Whipple procedure. No biliary dilatation. Pancreas: Stable postsurgical changes in the pancreatic head consistent with previous Whipple procedure. The pancreatic body and tail appears stable without ductal dilatation or surrounding inflammation. Spleen: Normal in size without focal abnormality. Adrenals/Urinary Tract: Stable 2.7 x 1.8 cm left adrenal nodule on image 59/2 from 2014, consistent with an incidental adenoma. No right adrenal nodule. Several small left renal cysts. No evidence of enhancing renal mass, urinary tract calculus or hydronephrosis. The bladder appears unremarkable. Stomach/Bowel: Postsurgical changes in the stomach and proximal small bowel attributed to previous Whipple procedure, grossly stable. No bowel wall thickening, distention or surrounding inflammation. The appendix appears normal. There are mild diverticular changes throughout the distal colon without evidence of acute inflammation. Vascular/Lymphatic: Interval improvement in previously demonstrated hypermetabolic upper retroperitoneal lymphadenopathy. There is remaining small node posterior to the left renal artery, measuring 1.3 x 0.9 cm on image 64/2 (previously 1.9 x 1.4 cm). No progressive adenopathy. Aortic and branch vessel atherosclerosis without acute vascular findings. The portal, superior mesenteric and splenic veins are patent. Reproductive: Hysterectomy.  No adnexal mass. Other: Intact abdominal wall. No ascites or peritoneal nodularity. Numerous cutaneous and subcutaneous nodules are present consistent with neurofibromatosis.  Musculoskeletal: No acute or significant osseous findings. Stable degenerative changes in the spine. IMPRESSION: 1. Interval response to therapy with decreased size of left infrahilar mass, improved left pleural metastases and improved mediastinal and upper abdominal adenopathy. No progressive metastatic disease identified. 2. Decreased left pleural effusion with improved aeration of the left lung. 3. 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 4. Stable left adrenal adenoma. 5. Numerous cutaneous and subcutaneous nodules consistent with neurofibromatosis. 6. Aortic Atherosclerosis (ICD10-I70.0) and Emphysema (ICD10-J43.9). Electronically Signed   By: Richardean Sale M.D.   On: 01/11/2020 10:37     ASSESSMENT/PLAN:  This is a very pleasant 64 year old Caucasian female diagnosed with extensive stage small cell lung cancer,the patient presented with a large left central left upper lobe lung mass invading the left hilum and mediastinum with extensive left-sided pleural metastatic disease and a malignant left pleural effusion. The patient had associated upper abdominal lymphadenopathy. She was diagnosed in August2021.  The patient recently had a brain MRI to complete the staging work-up whichshowed no definite metastatic disease to the brain. Several small developmental venous anomalies are noted, and a punctate area of enhancement in the right cerebellumshich also were felt to be vascular in nature. Radiology recommended a repeat brain MRI in 3 months. Therefore, I will arrange for her follow up brain MRI before her next follow up visit and  will place the order today.   The patient is currently undergoing palliative systemic chemotherapy with carboplatin for an AUC of5on day one, etoposide 100 mg per metered squared on days 1,2, and3and Imfinzi 1500 mg IV on day one IV every 3 weeks. The patient is also on Coselafor myeloprotection. She is status post 3 cycles and she tolerated it  well except for fatigue.  Labs were reviewed. She will proceed with cycle #4 today as scheduled.   I will arrange for a restaging CT scan of her chest, abdomen, and pelvis before her next cycle of treatment.   The patient is reporting LUQ/L lower rib pain. I have refilled her norco. Due to the patient's history of a pleural effusion, decreased breath sounds on the right, and her pleural disease, I will arrange for a chest x ray to be performed to see if her pain can be attributed to a pleural effusion that would be amenable to a thoracentesis. She will go to radiology today or tomorrow to have an xray performed.    We will see her back for a follow up visit in 3 weeks for evaluation and to review her CT scan and brain MRI before starting cycle #5.   The patient was advised to call immediately if she has any concerning symptoms in the interval. The patient voices understanding of current disease status and treatment options and is in agreement with the current care plan. All questions were answered. The patient knows to call the clinic with any problems, questions or concerns. We can certainly see the patient much sooner if necessary      Orders Placed This Encounter  Procedures  . CT Abdomen Pelvis W Contrast    Standing Status:   Future    Standing Expiration Date:   02/02/2021    Order Specific Question:   If indicated for the ordered procedure, I authorize the administration of contrast media per Radiology protocol    Answer:   Yes    Order Specific Question:   Preferred imaging location?    Answer:   [facility]    Order Specific Question:   Is Oral Contrast requested for this exam?    Answer:   Yes, Per Radiology protocol  . CT Chest W Contrast    Standing Status:   Future    Standing Expiration Date:   02/02/2021    Order Specific Question:   If indicated for the ordered procedure, I authorize the administration of contrast media per Radiology protocol    Answer:    Yes    Order Specific Question:   Preferred imaging location?    Answer:   [facility]  . DG Chest 2 View    Standing Status:   Future    Standing Expiration Date:   02/02/2021    Order Specific Question:   Reason for Exam (SYMPTOM  OR DIAGNOSIS REQUIRED)    Answer:   Small cell lung cancer, decreased breath sounds on the left, hx pleural effusion and pleural based nodules.    Order Specific Question:   Preferred imaging location?    Answer:   [facility]  . MR Brain W Wo Contrast    Standing Status:   Future    Standing Expiration Date:   02/02/2021    Order Specific Question:   If indicated for the ordered procedure, I authorize the administration of contrast media per Radiology protocol    Answer:   Yes  Order Specific Question:   What is the patient's sedation requirement?    Answer:   No Sedation    Order Specific Question:   Does the patient have a pacemaker or implanted devices?    Answer:   No    Order Specific Question:   Use SRS Protocol?    Answer:   No    Order Specific Question:   Preferred imaging location?    Answer:   [facility] (table limit - 550 lbs)     Karanvir Balderston L Adeola Dennen, PA-C 02/03/20

## 2020-01-31 DIAGNOSIS — J439 Emphysema, unspecified: Secondary | ICD-10-CM | POA: Diagnosis not present

## 2020-02-02 ENCOUNTER — Encounter: Payer: Self-pay | Admitting: Internal Medicine

## 2020-02-03 ENCOUNTER — Inpatient Hospital Stay: Payer: PPO

## 2020-02-03 ENCOUNTER — Ambulatory Visit (HOSPITAL_COMMUNITY)
Admission: RE | Admit: 2020-02-03 | Discharge: 2020-02-03 | Disposition: A | Discharge status: Home / Self Care | Payer: PPO | Source: Ambulatory Visit | Attending: Physician Assistant | Admitting: Physician Assistant

## 2020-02-03 ENCOUNTER — Other Ambulatory Visit: Payer: Self-pay | Admitting: Internal Medicine

## 2020-02-03 ENCOUNTER — Other Ambulatory Visit: Payer: Self-pay | Admitting: Physician Assistant

## 2020-02-03 ENCOUNTER — Inpatient Hospital Stay: Payer: PPO | Admitting: Nutrition

## 2020-02-03 ENCOUNTER — Telehealth: Payer: Self-pay | Admitting: Internal Medicine

## 2020-02-03 ENCOUNTER — Inpatient Hospital Stay (HOSPITAL_BASED_OUTPATIENT_CLINIC_OR_DEPARTMENT_OTHER): Payer: PPO | Admitting: Physician Assistant

## 2020-02-03 ENCOUNTER — Other Ambulatory Visit: Payer: Self-pay

## 2020-02-03 VITALS — BP 140/76 | HR 90 | Temp 97.8°F | Resp 18 | Ht 62.5 in | Wt 104.6 lb

## 2020-02-03 DIAGNOSIS — C3492 Malignant neoplasm of unspecified part of left bronchus or lung: Secondary | ICD-10-CM

## 2020-02-03 DIAGNOSIS — G893 Neoplasm related pain (acute) (chronic): Secondary | ICD-10-CM | POA: Diagnosis not present

## 2020-02-03 DIAGNOSIS — C3490 Malignant neoplasm of unspecified part of unspecified bronchus or lung: Secondary | ICD-10-CM

## 2020-02-03 DIAGNOSIS — Z5112 Encounter for antineoplastic immunotherapy: Secondary | ICD-10-CM

## 2020-02-03 DIAGNOSIS — Z5111 Encounter for antineoplastic chemotherapy: Secondary | ICD-10-CM

## 2020-02-03 DIAGNOSIS — R0989 Other specified symptoms and signs involving the circulatory and respiratory systems: Secondary | ICD-10-CM | POA: Diagnosis not present

## 2020-02-03 DIAGNOSIS — J90 Pleural effusion, not elsewhere classified: Secondary | ICD-10-CM | POA: Diagnosis not present

## 2020-02-03 LAB — CBC WITH DIFFERENTIAL (CANCER CENTER ONLY)
Abs Immature Granulocytes: 0.16 10*3/uL — ABNORMAL HIGH (ref 0.00–0.07)
Basophils Absolute: 0.1 10*3/uL (ref 0.0–0.1)
Basophils Relative: 1 %
Eosinophils Absolute: 0.1 10*3/uL (ref 0.0–0.5)
Eosinophils Relative: 1 %
HCT: 33.5 % — ABNORMAL LOW (ref 36.0–46.0)
Hemoglobin: 10.7 g/dL — ABNORMAL LOW (ref 12.0–15.0)
Immature Granulocytes: 2 %
Lymphocytes Relative: 32 %
Lymphs Abs: 3.2 10*3/uL (ref 0.7–4.0)
MCH: 27.8 pg (ref 26.0–34.0)
MCHC: 31.9 g/dL (ref 30.0–36.0)
MCV: 87 fL (ref 80.0–100.0)
Monocytes Absolute: 1.7 10*3/uL — ABNORMAL HIGH (ref 0.1–1.0)
Monocytes Relative: 17 %
Neutro Abs: 4.7 10*3/uL (ref 1.7–7.7)
Neutrophils Relative %: 47 %
Platelet Count: 583 10*3/uL — ABNORMAL HIGH (ref 150–400)
RBC: 3.85 MIL/uL — ABNORMAL LOW (ref 3.87–5.11)
RDW: 17.7 % — ABNORMAL HIGH (ref 11.5–15.5)
WBC Count: 9.9 10*3/uL (ref 4.0–10.5)
nRBC: 0 % (ref 0.0–0.2)

## 2020-02-03 LAB — CMP (CANCER CENTER ONLY)
ALT: 25 U/L (ref 0–44)
AST: 24 U/L (ref 15–41)
Albumin: 2.9 g/dL — ABNORMAL LOW (ref 3.5–5.0)
Alkaline Phosphatase: 85 U/L (ref 38–126)
Anion gap: 4 — ABNORMAL LOW (ref 5–15)
BUN: 16 mg/dL (ref 8–23)
CO2: 28 mmol/L (ref 22–32)
Calcium: 9.6 mg/dL (ref 8.9–10.3)
Chloride: 103 mmol/L (ref 98–111)
Creatinine: 0.72 mg/dL (ref 0.44–1.00)
GFR, Estimated: >60 mL/min (ref >60)
Glucose, Bld: 104 mg/dL — ABNORMAL HIGH (ref 70–99)
Potassium: 4.4 mmol/L (ref 3.5–5.1)
Sodium: 135 mmol/L (ref 135–145)
Total Bilirubin: <0.2 mg/dL — ABNORMAL LOW (ref 0.3–1.2)
Total Protein: 7.1 g/dL (ref 6.5–8.1)

## 2020-02-03 LAB — TSH: TSH: 2.812 u[IU]/mL (ref 0.308–3.960)

## 2020-02-03 MED ORDER — SODIUM CHLORIDE 0.9 % IV SOLN
150.0000 mg | Freq: Once | INTRAVENOUS | Status: AC
  Administered 2020-02-03: 150 mg via INTRAVENOUS
  Filled 2020-02-03: qty 150

## 2020-02-03 MED ORDER — SODIUM CHLORIDE 0.9 % IV SOLN
1500.0000 mg | Freq: Once | INTRAVENOUS | Status: AC
  Administered 2020-02-03: 1500 mg via INTRAVENOUS
  Filled 2020-02-03: qty 30

## 2020-02-03 MED ORDER — TRILACICLIB DIHYDROCHLORIDE INJECTION 300 MG
240.0000 mg/m2 | Freq: Once | INTRAVENOUS | Status: AC
  Administered 2020-02-03: 375 mg via INTRAVENOUS
  Filled 2020-02-03: qty 25

## 2020-02-03 MED ORDER — ACETAMINOPHEN 325 MG PO TABS
ORAL_TABLET | ORAL | Status: AC
  Filled 2020-02-03: qty 2

## 2020-02-03 MED ORDER — PALONOSETRON HCL INJECTION 0.25 MG/5ML
0.2500 mg | Freq: Once | INTRAVENOUS | Status: AC
  Administered 2020-02-03: 0.25 mg via INTRAVENOUS

## 2020-02-03 MED ORDER — HEPARIN SOD (PORK) LOCK FLUSH 100 UNIT/ML IV SOLN
500.0000 [IU] | Freq: Once | INTRAVENOUS | Status: AC | PRN
  Administered 2020-02-03: 500 [IU]
  Filled 2020-02-03: qty 5

## 2020-02-03 MED ORDER — SODIUM CHLORIDE 0.9% FLUSH
10.0000 mL | INTRAVENOUS | Status: DC | PRN
  Administered 2020-02-03: 10 mL
  Filled 2020-02-03: qty 10

## 2020-02-03 MED ORDER — HYDROCODONE-ACETAMINOPHEN 5-325 MG PO TABS
1.0000 | ORAL_TABLET | Freq: Four times a day (QID) | ORAL | 0 refills | Status: DC | PRN
Start: 2020-02-03 — End: 2020-02-22

## 2020-02-03 MED ORDER — ACETAMINOPHEN 325 MG PO TABS
650.0000 mg | ORAL_TABLET | Freq: Once | ORAL | Status: AC
  Administered 2020-02-03: 650 mg via ORAL

## 2020-02-03 MED ORDER — SODIUM CHLORIDE 0.9 % IV SOLN
10.0000 mg | Freq: Once | INTRAVENOUS | Status: AC
  Administered 2020-02-03: 10 mg via INTRAVENOUS
  Filled 2020-02-03: qty 10

## 2020-02-03 MED ORDER — PALONOSETRON HCL INJECTION 0.25 MG/5ML
INTRAVENOUS | Status: AC
  Filled 2020-02-03: qty 5

## 2020-02-03 MED ORDER — SODIUM CHLORIDE 0.9 % IV SOLN
100.0000 mg/m2 | Freq: Once | INTRAVENOUS | Status: AC
  Administered 2020-02-03: 140 mg via INTRAVENOUS
  Filled 2020-02-03: qty 7

## 2020-02-03 MED ORDER — SODIUM CHLORIDE 0.9 % IV SOLN
328.5000 mg | Freq: Once | INTRAVENOUS | Status: AC
  Administered 2020-02-03: 330 mg via INTRAVENOUS
  Filled 2020-02-03: qty 33

## 2020-02-03 MED ORDER — SODIUM CHLORIDE 0.9 % IV SOLN
Freq: Once | INTRAVENOUS | Status: AC
  Filled 2020-02-03: qty 250

## 2020-02-03 NOTE — Telephone Encounter (Signed)
Scheduled appointments per 10/13 los. Spoke to patient's brother who is aware of all upcoming appointments dates and times. Gave him an updated calendar.

## 2020-02-03 NOTE — Patient Instructions (Signed)

## 2020-02-03 NOTE — Progress Notes (Signed)
Brief follow-up completed with patient during infusion for small cell lung cancer. Weight improved and documented as 104.6 pounds on October 13, increased from 102.8 pounds September 22. Patient reports she is having some pain on her side. She had one episode of vomiting on Sunday. She is drinking a protein shake from the juice shop but not daily. Reports she eats what she can. Noted labs: Glucose 104 and albumin 2.9.  Nutrition diagnosis: Unintended weight loss improved.  Intervention: Provided support and encouragement for patient to continue strategies for increased calories and protein. Educated continuing high-calorie protein oral nutrition supplement once daily.  Monitoring, evaluation, goals: Patient will work to increase calories and protein to promote weight gain/weight stabilization.  Next visit: To be scheduled as needed.  **Disclaimer: This note was dictated with voice recognition software. Similar sounding words can inadvertently be transcribed and this note may contain transcription errors which may not have been corrected upon publication of note.**

## 2020-02-03 NOTE — Patient Instructions (Signed)
[facility] Discharge Instructions for Patients Receiving Chemotherapy  Today you received the following chemotherapy agents: Imfinzi, Carboplatin, etoposide  To help prevent nausea and vomiting after your treatment, we encourage you to take your nausea medication as directed.   If you develop nausea and vomiting that is not controlled by your nausea medication, call the clinic.   BELOW ARE SYMPTOMS THAT SHOULD BE REPORTED IMMEDIATELY:  *FEVER GREATER THAN 100.5 F  *CHILLS WITH OR WITHOUT FEVER  NAUSEA AND VOMITING THAT IS NOT CONTROLLED WITH YOUR NAUSEA MEDICATION  *UNUSUAL SHORTNESS OF BREATH  *UNUSUAL BRUISING OR BLEEDING  TENDERNESS IN MOUTH AND THROAT WITH OR WITHOUT PRESENCE OF ULCERS  *URINARY PROBLEMS  *BOWEL PROBLEMS  UNUSUAL RASH Items with * indicate a potential emergency and should be followed up as soon as possible.  Feel free to call the clinic should you have any questions or concerns. The clinic phone number is [phone].  Please show the [facility] at check-in to the Emergency Department and triage nurse.

## 2020-02-04 ENCOUNTER — Encounter: Payer: Self-pay | Admitting: Internal Medicine

## 2020-02-04 ENCOUNTER — Inpatient Hospital Stay: Payer: PPO

## 2020-02-04 ENCOUNTER — Other Ambulatory Visit: Payer: Self-pay

## 2020-02-04 VITALS — BP 133/56 | HR 97 | Temp 97.7°F | Resp 16

## 2020-02-04 DIAGNOSIS — Z5111 Encounter for antineoplastic chemotherapy: Secondary | ICD-10-CM | POA: Diagnosis not present

## 2020-02-04 DIAGNOSIS — C3492 Malignant neoplasm of unspecified part of left bronchus or lung: Secondary | ICD-10-CM

## 2020-02-04 MED ORDER — SODIUM CHLORIDE 0.9 % IV SOLN
Freq: Once | INTRAVENOUS | Status: AC
  Filled 2020-02-04: qty 250

## 2020-02-04 MED ORDER — TRILACICLIB DIHYDROCHLORIDE INJECTION 300 MG
240.0000 mg/m2 | Freq: Once | INTRAVENOUS | Status: AC
  Administered 2020-02-04: 345 mg via INTRAVENOUS
  Filled 2020-02-04: qty 23

## 2020-02-04 MED ORDER — HEPARIN SOD (PORK) LOCK FLUSH 100 UNIT/ML IV SOLN
500.0000 [IU] | Freq: Once | INTRAVENOUS | Status: AC | PRN
  Administered 2020-02-04: 500 [IU]
  Filled 2020-02-04: qty 5

## 2020-02-04 MED ORDER — SODIUM CHLORIDE 0.9 % IV SOLN
10.0000 mg | Freq: Once | INTRAVENOUS | Status: AC
  Administered 2020-02-04: 10 mg via INTRAVENOUS
  Filled 2020-02-04: qty 10

## 2020-02-04 MED ORDER — SODIUM CHLORIDE 0.9% FLUSH
10.0000 mL | INTRAVENOUS | Status: DC | PRN
  Administered 2020-02-04: 10 mL
  Filled 2020-02-04: qty 10

## 2020-02-04 MED ORDER — SODIUM CHLORIDE 0.9 % IV SOLN
100.0000 mg/m2 | Freq: Once | INTRAVENOUS | Status: AC
  Administered 2020-02-04: 140 mg via INTRAVENOUS
  Filled 2020-02-04: qty 7

## 2020-02-04 NOTE — Patient Instructions (Signed)
[facility] Discharge Instructions for Patients Receiving Chemotherapy  Today you received the following chemotherapy agents etoposide   To help prevent nausea and vomiting after your treatment, we encourage you to take your nausea medication as directed  If you develop nausea and vomiting that is not controlled by your nausea medication, call the clinic.   BELOW ARE SYMPTOMS THAT SHOULD BE REPORTED IMMEDIATELY:  *FEVER GREATER THAN 100.5 F  *CHILLS WITH OR WITHOUT FEVER  NAUSEA AND VOMITING THAT IS NOT CONTROLLED WITH YOUR NAUSEA MEDICATION  *UNUSUAL SHORTNESS OF BREATH  *UNUSUAL BRUISING OR BLEEDING  TENDERNESS IN MOUTH AND THROAT WITH OR WITHOUT PRESENCE OF ULCERS  *URINARY PROBLEMS  *BOWEL PROBLEMS  UNUSUAL RASH Items with * indicate a potential emergency and should be followed up as soon as possible.  Feel free to call the clinic you have any questions or concerns. The clinic phone number is [phone].

## 2020-02-05 ENCOUNTER — Other Ambulatory Visit: Payer: Self-pay

## 2020-02-05 ENCOUNTER — Inpatient Hospital Stay: Payer: PPO

## 2020-02-05 VITALS — BP 160/81 | HR 70 | Temp 98.0°F | Resp 18

## 2020-02-05 DIAGNOSIS — Z5111 Encounter for antineoplastic chemotherapy: Secondary | ICD-10-CM | POA: Diagnosis not present

## 2020-02-05 DIAGNOSIS — C3492 Malignant neoplasm of unspecified part of left bronchus or lung: Secondary | ICD-10-CM

## 2020-02-05 MED ORDER — SODIUM CHLORIDE 0.9 % IV SOLN
100.0000 mg/m2 | Freq: Once | INTRAVENOUS | Status: AC
  Administered 2020-02-05: 140 mg via INTRAVENOUS
  Filled 2020-02-05: qty 7

## 2020-02-05 MED ORDER — HEPARIN SOD (PORK) LOCK FLUSH 100 UNIT/ML IV SOLN
500.0000 [IU] | Freq: Once | INTRAVENOUS | Status: AC | PRN
  Administered 2020-02-05: 500 [IU]
  Filled 2020-02-05: qty 5

## 2020-02-05 MED ORDER — SODIUM CHLORIDE 0.9 % IV SOLN
10.0000 mg | Freq: Once | INTRAVENOUS | Status: AC
  Administered 2020-02-05: 10 mg via INTRAVENOUS
  Filled 2020-02-05: qty 10

## 2020-02-05 MED ORDER — SODIUM CHLORIDE 0.9% FLUSH
10.0000 mL | INTRAVENOUS | Status: DC | PRN
  Administered 2020-02-05: 10 mL
  Filled 2020-02-05: qty 10

## 2020-02-05 MED ORDER — SODIUM CHLORIDE 0.9 % IV SOLN
Freq: Once | INTRAVENOUS | Status: AC
  Filled 2020-02-05: qty 250

## 2020-02-05 MED ORDER — TRILACICLIB DIHYDROCHLORIDE INJECTION 300 MG
240.0000 mg/m2 | Freq: Once | INTRAVENOUS | Status: AC
  Administered 2020-02-05: 345 mg via INTRAVENOUS
  Filled 2020-02-05: qty 23

## 2020-02-05 NOTE — Patient Instructions (Signed)
Etoposide, VP-16 injection What is this medicine? ETOPOSIDE, VP-16 (e toe POE side) is a chemotherapy drug. It is used to treat testicular cancer, lung cancer, and other cancers. This medicine may be used for other purposes; ask your health care provider or pharmacist if you have questions. COMMON BRAND NAME(S): Etopophos, Toposar, VePesid What should I tell my health care provider before I take this medicine? They need to know if you have any of these conditions:  infection  kidney disease  liver disease  low blood counts, like low white cell, platelet, or red cell counts  an unusual or allergic reaction to etoposide, other medicines, foods, dyes, or preservatives  pregnant or trying to get pregnant  breast-feeding How should I use this medicine? This medicine is for infusion into a vein. It is administered in a hospital or clinic by a specially trained health care professional. Talk to your pediatrician regarding the use of this medicine in children. Special care may be needed. Overdosage: If you think you have taken too much of this medicine contact a poison control center or emergency room at once. NOTE: This medicine is only for you. Do not share this medicine with others. What if I miss a dose? It is important not to miss your dose. Call your doctor or health care professional if you are unable to keep an appointment. What may interact with this medicine? This medicine may interact with the following medications:  warfarin This list may not describe all possible interactions. Give your health care provider a list of all the medicines, herbs, non-prescription drugs, or dietary supplements you use. Also tell them if you smoke, drink alcohol, or use illegal drugs. Some items may interact with your medicine. What should I watch for while using this medicine? Visit your doctor for checks on your progress. This drug may make you feel generally unwell. This is not uncommon, as  chemotherapy can affect healthy cells as well as cancer cells. Report any side effects. Continue your course of treatment even though you feel ill unless your doctor tells you to stop. In some cases, you may be given additional medicines to help with side effects. Follow all directions for their use. Call your doctor or health care professional for advice if you get a fever, chills or sore throat, or other symptoms of a cold or flu. Do not treat yourself. This drug decreases your body's ability to fight infections. Try to avoid being around people who are sick. This medicine may increase your risk to bruise or bleed. Call your doctor or health care professional if you notice any unusual bleeding. Talk to your doctor about your risk of cancer. You may be more at risk for certain types of cancers if you take this medicine. Do not become pregnant while taking this medicine or for at least 6 months after stopping it. Women should inform their doctor if they wish to become pregnant or think they might be pregnant. Women of child-bearing potential will need to have a negative pregnancy test before starting this medicine. There is a potential for serious side effects to an unborn child. Talk to your health care professional or pharmacist for more information. Do not breast-feed an infant while taking this medicine. Men must use a latex condom during sexual contact with a woman while taking this medicine and for at least 4 months after stopping it. A latex condom is needed even if you have had a vasectomy. Contact your doctor right away if your partner  becomes pregnant. Do not donate sperm while taking this medicine and for at least 4 months after you stop taking this medicine. Men should inform their doctors if they wish to father a child. This medicine may lower sperm counts. What side effects may I notice from receiving this medicine? Side effects that you should report to your doctor or health care professional  as soon as possible:  allergic reactions like skin rash, itching or hives, swelling of the face, lips, or tongue  low blood counts - this medicine may decrease the number of white blood cells, red blood cells, and platelets. You may be at increased risk for infections and bleeding  nausea, vomiting  redness, blistering, peeling or loosening of the skin, including inside the mouth  signs and symptoms of infection like fever; chills; cough; sore throat; pain or trouble passing urine  signs and symptoms of low red blood cells or anemia such as unusually weak or tired; feeling faint or lightheaded; falls; breathing problems  unusual bruising or bleeding Side effects that usually do not require medical attention (report to your doctor or health care professional if they continue or are bothersome):  changes in taste  diarrhea  hair loss  loss of appetite  mouth sores This list may not describe all possible side effects. Call your doctor for medical advice about side effects. You may report side effects to FDA at 1-800-FDA-1088. Where should I keep my medicine? This drug is given in a hospital or clinic and will not be stored at home. NOTE: This sheet is a summary. It may not cover all possible information. If you have questions about this medicine, talk to your doctor, pharmacist, or health care provider.  2020 [employer] (2018-06-04 16:57:15)

## 2020-02-08 ENCOUNTER — Other Ambulatory Visit: Payer: Self-pay | Admitting: Physician Assistant

## 2020-02-08 DIAGNOSIS — C3492 Malignant neoplasm of unspecified part of left bronchus or lung: Secondary | ICD-10-CM

## 2020-02-10 ENCOUNTER — Other Ambulatory Visit: Payer: Self-pay

## 2020-02-10 ENCOUNTER — Inpatient Hospital Stay: Payer: PPO

## 2020-02-10 VITALS — BP 100/69 | HR 81 | Temp 97.7°F | Resp 18

## 2020-02-10 DIAGNOSIS — C3492 Malignant neoplasm of unspecified part of left bronchus or lung: Secondary | ICD-10-CM

## 2020-02-10 DIAGNOSIS — Z95828 Presence of other vascular implants and grafts: Secondary | ICD-10-CM

## 2020-02-10 DIAGNOSIS — Z5111 Encounter for antineoplastic chemotherapy: Secondary | ICD-10-CM | POA: Diagnosis not present

## 2020-02-10 LAB — CBC WITH DIFFERENTIAL (CANCER CENTER ONLY)
Abs Immature Granulocytes: 0.2 10*3/uL — ABNORMAL HIGH (ref 0.00–0.07)
Basophils Absolute: 0.1 10*3/uL (ref 0.0–0.1)
Basophils Relative: 1 %
Eosinophils Absolute: 0.1 10*3/uL (ref 0.0–0.5)
Eosinophils Relative: 1 %
HCT: 31.4 % — ABNORMAL LOW (ref 36.0–46.0)
Hemoglobin: 10.5 g/dL — ABNORMAL LOW (ref 12.0–15.0)
Immature Granulocytes: 2 %
Lymphocytes Relative: 20 %
Lymphs Abs: 2.3 10*3/uL (ref 0.7–4.0)
MCH: 28.5 pg (ref 26.0–34.0)
MCHC: 33.4 g/dL (ref 30.0–36.0)
MCV: 85.3 fL (ref 80.0–100.0)
Monocytes Absolute: 0.2 10*3/uL (ref 0.1–1.0)
Monocytes Relative: 2 %
Neutro Abs: 8.6 10*3/uL — ABNORMAL HIGH (ref 1.7–7.7)
Neutrophils Relative %: 74 %
Platelet Count: 533 10*3/uL — ABNORMAL HIGH (ref 150–400)
RBC: 3.68 MIL/uL — ABNORMAL LOW (ref 3.87–5.11)
RDW: 17 % — ABNORMAL HIGH (ref 11.5–15.5)
WBC Count: 11.5 10*3/uL — ABNORMAL HIGH (ref 4.0–10.5)
nRBC: 0 % (ref 0.0–0.2)

## 2020-02-10 LAB — CMP (CANCER CENTER ONLY)
ALT: 29 U/L (ref 0–44)
AST: 23 U/L (ref 15–41)
Albumin: 3.1 g/dL — ABNORMAL LOW (ref 3.5–5.0)
Alkaline Phosphatase: 85 U/L (ref 38–126)
Anion gap: 7 (ref 5–15)
BUN: 23 mg/dL (ref 8–23)
CO2: 24 mmol/L (ref 22–32)
Calcium: 9.5 mg/dL (ref 8.9–10.3)
Chloride: 102 mmol/L (ref 98–111)
Creatinine: 0.71 mg/dL (ref 0.44–1.00)
GFR, Estimated: >60 mL/min (ref >60)
Glucose, Bld: 104 mg/dL — ABNORMAL HIGH (ref 70–99)
Potassium: 4.6 mmol/L (ref 3.5–5.1)
Sodium: 133 mmol/L — ABNORMAL LOW (ref 135–145)
Total Bilirubin: 0.3 mg/dL (ref 0.3–1.2)
Total Protein: 7 g/dL (ref 6.5–8.1)

## 2020-02-10 MED ORDER — SODIUM CHLORIDE 0.9% FLUSH
10.0000 mL | INTRAVENOUS | Status: DC | PRN
  Administered 2020-02-10: 10 mL via INTRAVENOUS
  Filled 2020-02-10: qty 10

## 2020-02-10 MED ORDER — HEPARIN SOD (PORK) LOCK FLUSH 100 UNIT/ML IV SOLN
500.0000 [IU] | Freq: Once | INTRAVENOUS | Status: AC
  Administered 2020-02-10: 500 [IU] via INTRAVENOUS
  Filled 2020-02-10: qty 5

## 2020-02-10 NOTE — Patient Instructions (Signed)

## 2020-02-15 ENCOUNTER — Other Ambulatory Visit (HOSPITAL_COMMUNITY)
Admission: RE | Admit: 2020-02-15 | Discharge: 2020-02-15 | Disposition: A | Discharge status: Home / Self Care | Payer: PPO | Source: Ambulatory Visit | Attending: Physician Assistant | Admitting: Physician Assistant

## 2020-02-15 DIAGNOSIS — Z20822 Contact with and (suspected) exposure to covid-19: Secondary | ICD-10-CM | POA: Diagnosis not present

## 2020-02-15 DIAGNOSIS — Z01812 Encounter for preprocedural laboratory examination: Secondary | ICD-10-CM | POA: Insufficient documentation

## 2020-02-15 LAB — SARS CORONAVIRUS 2 (TAT 6-24 HRS): SARS Coronavirus 2: NEGATIVE

## 2020-02-17 ENCOUNTER — Inpatient Hospital Stay: Payer: PPO

## 2020-02-17 ENCOUNTER — Other Ambulatory Visit: Payer: Self-pay

## 2020-02-17 ENCOUNTER — Ambulatory Visit (HOSPITAL_COMMUNITY)
Admission: RE | Admit: 2020-02-17 | Discharge: 2020-02-17 | Disposition: A | Discharge status: Home / Self Care | Payer: PPO | Source: Ambulatory Visit | Attending: Physician Assistant | Admitting: Physician Assistant

## 2020-02-17 DIAGNOSIS — C3490 Malignant neoplasm of unspecified part of unspecified bronchus or lung: Secondary | ICD-10-CM | POA: Insufficient documentation

## 2020-02-17 DIAGNOSIS — Z95828 Presence of other vascular implants and grafts: Secondary | ICD-10-CM

## 2020-02-17 DIAGNOSIS — I6782 Cerebral ischemia: Secondary | ICD-10-CM | POA: Diagnosis not present

## 2020-02-17 DIAGNOSIS — Q283 Other malformations of cerebral vessels: Secondary | ICD-10-CM | POA: Diagnosis not present

## 2020-02-17 DIAGNOSIS — C3492 Malignant neoplasm of unspecified part of left bronchus or lung: Secondary | ICD-10-CM | POA: Insufficient documentation

## 2020-02-17 DIAGNOSIS — Z5111 Encounter for antineoplastic chemotherapy: Secondary | ICD-10-CM | POA: Diagnosis not present

## 2020-02-17 LAB — CBC WITH DIFFERENTIAL (CANCER CENTER ONLY)
Abs Immature Granulocytes: 0.03 10*3/uL (ref 0.00–0.07)
Basophils Absolute: 0.1 10*3/uL (ref 0.0–0.1)
Basophils Relative: 1 %
Eosinophils Absolute: 0 10*3/uL (ref 0.0–0.5)
Eosinophils Relative: 0 %
HCT: 31.1 % — ABNORMAL LOW (ref 36.0–46.0)
Hemoglobin: 10.2 g/dL — ABNORMAL LOW (ref 12.0–15.0)
Immature Granulocytes: 0 %
Lymphocytes Relative: 33 %
Lymphs Abs: 2.4 10*3/uL (ref 0.7–4.0)
MCH: 28.5 pg (ref 26.0–34.0)
MCHC: 32.8 g/dL (ref 30.0–36.0)
MCV: 86.9 fL (ref 80.0–100.0)
Monocytes Absolute: 1.1 10*3/uL — ABNORMAL HIGH (ref 0.1–1.0)
Monocytes Relative: 16 %
Neutro Abs: 3.6 10*3/uL (ref 1.7–7.7)
Neutrophils Relative %: 50 %
Platelet Count: 309 10*3/uL (ref 150–400)
RBC: 3.58 MIL/uL — ABNORMAL LOW (ref 3.87–5.11)
RDW: 17.8 % — ABNORMAL HIGH (ref 11.5–15.5)
WBC Count: 7.3 10*3/uL (ref 4.0–10.5)
nRBC: 0 % (ref 0.0–0.2)

## 2020-02-17 LAB — CMP (CANCER CENTER ONLY)
ALT: 23 U/L (ref 0–44)
AST: 22 U/L (ref 15–41)
Albumin: 3.3 g/dL — ABNORMAL LOW (ref 3.5–5.0)
Alkaline Phosphatase: 82 U/L (ref 38–126)
Anion gap: 5 (ref 5–15)
BUN: 17 mg/dL (ref 8–23)
CO2: 25 mmol/L (ref 22–32)
Calcium: 9.9 mg/dL (ref 8.9–10.3)
Chloride: 102 mmol/L (ref 98–111)
Creatinine: 0.62 mg/dL (ref 0.44–1.00)
GFR, Estimated: >60 mL/min (ref >60)
Glucose, Bld: 95 mg/dL (ref 70–99)
Potassium: 4.4 mmol/L (ref 3.5–5.1)
Sodium: 132 mmol/L — ABNORMAL LOW (ref 135–145)
Total Bilirubin: <0.2 mg/dL — ABNORMAL LOW (ref 0.3–1.2)
Total Protein: 7.2 g/dL (ref 6.5–8.1)

## 2020-02-17 MED ORDER — HEPARIN SOD (PORK) LOCK FLUSH 100 UNIT/ML IV SOLN: 500.0000 [IU] | Freq: Once | INTRAVENOUS | Status: DC

## 2020-02-17 MED ORDER — GADOBUTROL 1 MMOL/ML IV SOLN
5.0000 mL | Freq: Once | INTRAVENOUS | Status: AC | PRN
  Administered 2020-02-17: 5 mL via INTRAVENOUS

## 2020-02-17 MED ORDER — SODIUM CHLORIDE 0.9% FLUSH
10.0000 mL | INTRAVENOUS | Status: DC | PRN
  Administered 2020-02-17: 10 mL via INTRAVENOUS
  Filled 2020-02-17: qty 10

## 2020-02-17 MED ORDER — HEPARIN SOD (PORK) LOCK FLUSH 100 UNIT/ML IV SOLN
INTRAVENOUS | Status: AC
  Filled 2020-02-17: qty 5

## 2020-02-17 MED ORDER — HEPARIN SOD (PORK) LOCK FLUSH 100 UNIT/ML IV SOLN
500.0000 [IU] | Freq: Once | INTRAVENOUS | Status: AC
  Administered 2020-02-17: 500 [IU] via INTRAVENOUS
  Filled 2020-02-17: qty 5

## 2020-02-18 ENCOUNTER — Ambulatory Visit (HOSPITAL_COMMUNITY)
Admission: RE | Admit: 2020-02-18 | Discharge: 2020-02-18 | Disposition: A | Discharge status: Home / Self Care | Payer: PPO | Source: Ambulatory Visit | Attending: Physician Assistant | Admitting: Physician Assistant

## 2020-02-18 ENCOUNTER — Ambulatory Visit (HOSPITAL_COMMUNITY)
Admission: RE | Admit: 2020-02-18 | Discharge: 2020-02-18 | Disposition: A | Discharge status: Home / Self Care | Payer: PPO | Source: Ambulatory Visit | Attending: Student | Admitting: Student

## 2020-02-18 DIAGNOSIS — J90 Pleural effusion, not elsewhere classified: Secondary | ICD-10-CM | POA: Diagnosis not present

## 2020-02-18 DIAGNOSIS — I700 Atherosclerosis of aorta: Secondary | ICD-10-CM | POA: Diagnosis not present

## 2020-02-18 DIAGNOSIS — Z452 Encounter for adjustment and management of vascular access device: Secondary | ICD-10-CM | POA: Insufficient documentation

## 2020-02-18 DIAGNOSIS — J9811 Atelectasis: Secondary | ICD-10-CM | POA: Diagnosis not present

## 2020-02-18 DIAGNOSIS — Z9889 Other specified postprocedural states: Secondary | ICD-10-CM

## 2020-02-18 DIAGNOSIS — C3492 Malignant neoplasm of unspecified part of left bronchus or lung: Secondary | ICD-10-CM | POA: Insufficient documentation

## 2020-02-18 MED ORDER — LIDOCAINE HCL 1 % IJ SOLN
INTRAMUSCULAR | Status: AC
  Filled 2020-02-18: qty 20

## 2020-02-18 NOTE — Procedures (Signed)
PROCEDURE SUMMARY:  Successful image-guided left thoracentesis. Yielded 650 milliliters of hazy gold fluid. Patient tolerated procedure well. No immediate complications. EBL < 2 mL.  Specimen was not sent for labs. CXR ordered.  Please see imaging section of Epic for full dictation.   Masey Scheiber PA-C 02/18/2020 10:07 AM

## 2020-02-18 NOTE — Telephone Encounter (Signed)
Dr. Lamonte Sakai, please see message from pt and advise.

## 2020-02-21 ENCOUNTER — Encounter: Payer: Self-pay | Admitting: Internal Medicine

## 2020-02-22 ENCOUNTER — Other Ambulatory Visit: Payer: Self-pay | Admitting: Internal Medicine

## 2020-02-22 ENCOUNTER — Ambulatory Visit (HOSPITAL_COMMUNITY)
Admission: RE | Admit: 2020-02-22 | Discharge: 2020-02-22 | Disposition: A | Discharge status: Home / Self Care | Payer: PPO | Source: Ambulatory Visit | Attending: Physician Assistant | Admitting: Physician Assistant

## 2020-02-22 ENCOUNTER — Other Ambulatory Visit: Payer: Self-pay

## 2020-02-22 ENCOUNTER — Encounter: Payer: Self-pay | Admitting: Family Medicine

## 2020-02-22 ENCOUNTER — Encounter (HOSPITAL_COMMUNITY): Payer: Self-pay

## 2020-02-22 ENCOUNTER — Other Ambulatory Visit: Payer: Self-pay | Admitting: Physician Assistant

## 2020-02-22 DIAGNOSIS — E278 Other specified disorders of adrenal gland: Secondary | ICD-10-CM | POA: Diagnosis not present

## 2020-02-22 DIAGNOSIS — J90 Pleural effusion, not elsewhere classified: Secondary | ICD-10-CM | POA: Diagnosis not present

## 2020-02-22 DIAGNOSIS — D3500 Benign neoplasm of unspecified adrenal gland: Secondary | ICD-10-CM | POA: Diagnosis not present

## 2020-02-22 DIAGNOSIS — C3492 Malignant neoplasm of unspecified part of left bronchus or lung: Secondary | ICD-10-CM

## 2020-02-22 DIAGNOSIS — I2510 Atherosclerotic heart disease of native coronary artery without angina pectoris: Secondary | ICD-10-CM | POA: Diagnosis not present

## 2020-02-22 DIAGNOSIS — K7689 Other specified diseases of liver: Secondary | ICD-10-CM | POA: Diagnosis not present

## 2020-02-22 DIAGNOSIS — I700 Atherosclerosis of aorta: Secondary | ICD-10-CM | POA: Diagnosis not present

## 2020-02-22 DIAGNOSIS — G893 Neoplasm related pain (acute) (chronic): Secondary | ICD-10-CM

## 2020-02-22 DIAGNOSIS — Z5111 Encounter for antineoplastic chemotherapy: Secondary | ICD-10-CM

## 2020-02-22 DIAGNOSIS — J181 Lobar pneumonia, unspecified organism: Secondary | ICD-10-CM | POA: Diagnosis not present

## 2020-02-22 HISTORY — DX: Malignant (primary) neoplasm, unspecified: C80.1

## 2020-02-22 MED ORDER — HEPARIN SOD (PORK) LOCK FLUSH 100 UNIT/ML IV SOLN
INTRAVENOUS | Status: AC
  Filled 2020-02-22: qty 5

## 2020-02-22 MED ORDER — IOHEXOL 300 MG/ML  SOLN
100.0000 mL | Freq: Once | INTRAMUSCULAR | Status: AC | PRN
  Administered 2020-02-22: 100 mL via INTRAVENOUS

## 2020-02-22 MED ORDER — HYDROCODONE-ACETAMINOPHEN 5-325 MG PO TABS: 1.0000 | ORAL_TABLET | Freq: Four times a day (QID) | ORAL | 0 refills | Status: DC | PRN

## 2020-02-22 MED ORDER — PROCHLORPERAZINE MALEATE 10 MG PO TABS: ORAL_TABLET | ORAL | 0 refills | Status: DC

## 2020-02-22 NOTE — Telephone Encounter (Signed)
It may be that her pleural effusion is influencing her breathing more than the COPD. I would be willing to change Spiriva to Stiolto to see if she gets more benefit from this.

## 2020-02-22 NOTE — Telephone Encounter (Signed)
Request pain med refill Hydrocodone

## 2020-02-23 ENCOUNTER — Other Ambulatory Visit: Payer: Self-pay | Admitting: Physician Assistant

## 2020-02-23 DIAGNOSIS — C3492 Malignant neoplasm of unspecified part of left bronchus or lung: Secondary | ICD-10-CM

## 2020-02-23 MED ORDER — STIOLTO RESPIMAT 2.5-2.5 MCG/ACT IN AERS: 2.0000 | INHALATION_SPRAY | Freq: Every day | RESPIRATORY_TRACT | 5 refills | Status: DC

## 2020-02-24 ENCOUNTER — Inpatient Hospital Stay: Payer: PPO

## 2020-02-24 ENCOUNTER — Encounter: Payer: Self-pay | Admitting: Internal Medicine

## 2020-02-24 ENCOUNTER — Other Ambulatory Visit: Payer: Self-pay

## 2020-02-24 ENCOUNTER — Inpatient Hospital Stay: Payer: PPO | Admitting: Internal Medicine

## 2020-02-24 ENCOUNTER — Inpatient Hospital Stay: Payer: PPO | Attending: Physician Assistant

## 2020-02-24 VITALS — BP 136/73 | HR 92 | Temp 96.7°F | Resp 18 | Ht 62.0 in | Wt 102.2 lb

## 2020-02-24 DIAGNOSIS — C170 Malignant neoplasm of duodenum: Secondary | ICD-10-CM | POA: Diagnosis not present

## 2020-02-24 DIAGNOSIS — C3412 Malignant neoplasm of upper lobe, left bronchus or lung: Secondary | ICD-10-CM | POA: Diagnosis not present

## 2020-02-24 DIAGNOSIS — C782 Secondary malignant neoplasm of pleura: Secondary | ICD-10-CM | POA: Insufficient documentation

## 2020-02-24 DIAGNOSIS — Z79899 Other long term (current) drug therapy: Secondary | ICD-10-CM | POA: Diagnosis not present

## 2020-02-24 DIAGNOSIS — Z95828 Presence of other vascular implants and grafts: Secondary | ICD-10-CM

## 2020-02-24 DIAGNOSIS — Z5111 Encounter for antineoplastic chemotherapy: Secondary | ICD-10-CM | POA: Diagnosis not present

## 2020-02-24 DIAGNOSIS — C3492 Malignant neoplasm of unspecified part of left bronchus or lung: Secondary | ICD-10-CM

## 2020-02-24 DIAGNOSIS — Z7189 Other specified counseling: Secondary | ICD-10-CM | POA: Diagnosis not present

## 2020-02-24 LAB — CMP (CANCER CENTER ONLY)
ALT: 21 U/L (ref 0–44)
AST: 25 U/L (ref 15–41)
Albumin: 2.9 g/dL — ABNORMAL LOW (ref 3.5–5.0)
Alkaline Phosphatase: 81 U/L (ref 38–126)
Anion gap: 9 (ref 5–15)
BUN: 17 mg/dL (ref 8–23)
CO2: 22 mmol/L (ref 22–32)
Calcium: 9.2 mg/dL (ref 8.9–10.3)
Chloride: 103 mmol/L (ref 98–111)
Creatinine: 0.68 mg/dL (ref 0.44–1.00)
GFR, Estimated: >60 mL/min (ref >60)
Glucose, Bld: 134 mg/dL — ABNORMAL HIGH (ref 70–99)
Potassium: 4.3 mmol/L (ref 3.5–5.1)
Sodium: 134 mmol/L — ABNORMAL LOW (ref 135–145)
Total Bilirubin: <0.2 mg/dL — ABNORMAL LOW (ref 0.3–1.2)
Total Protein: 6.9 g/dL (ref 6.5–8.1)

## 2020-02-24 LAB — CBC WITH DIFFERENTIAL (CANCER CENTER ONLY)
Abs Immature Granulocytes: 0.42 10*3/uL — ABNORMAL HIGH (ref 0.00–0.07)
Basophils Absolute: 0.1 10*3/uL (ref 0.0–0.1)
Basophils Relative: 1 %
Eosinophils Absolute: 0 10*3/uL (ref 0.0–0.5)
Eosinophils Relative: 0 %
HCT: 32.5 % — ABNORMAL LOW (ref 36.0–46.0)
Hemoglobin: 10.7 g/dL — ABNORMAL LOW (ref 12.0–15.0)
Immature Granulocytes: 4 %
Lymphocytes Relative: 22 %
Lymphs Abs: 2.6 10*3/uL (ref 0.7–4.0)
MCH: 28.5 pg (ref 26.0–34.0)
MCHC: 32.9 g/dL (ref 30.0–36.0)
MCV: 86.4 fL (ref 80.0–100.0)
Monocytes Absolute: 2.2 10*3/uL — ABNORMAL HIGH (ref 0.1–1.0)
Monocytes Relative: 19 %
Neutro Abs: 6.3 10*3/uL (ref 1.7–7.7)
Neutrophils Relative %: 54 %
Platelet Count: 634 10*3/uL — ABNORMAL HIGH (ref 150–400)
RBC: 3.76 MIL/uL — ABNORMAL LOW (ref 3.87–5.11)
RDW: 18.4 % — ABNORMAL HIGH (ref 11.5–15.5)
WBC Count: 11.6 10*3/uL — ABNORMAL HIGH (ref 4.0–10.5)
nRBC: 0 % (ref 0.0–0.2)

## 2020-02-24 LAB — TSH: TSH: 4.676 u[IU]/mL — ABNORMAL HIGH (ref 0.308–3.960)

## 2020-02-24 MED ORDER — MORPHINE SULFATE ER 30 MG PO TBCR
30.0000 mg | EXTENDED_RELEASE_TABLET | Freq: Two times a day (BID) | ORAL | 0 refills | Status: DC
Start: 2020-02-24 — End: 2020-03-23

## 2020-02-24 MED ORDER — SODIUM CHLORIDE 0.9% FLUSH
10.0000 mL | Freq: Once | INTRAVENOUS | Status: AC
  Administered 2020-02-24: 10 mL
  Filled 2020-02-24: qty 10

## 2020-02-24 MED ORDER — HEPARIN SOD (PORK) LOCK FLUSH 100 UNIT/ML IV SOLN
500.0000 [IU] | Freq: Once | INTRAVENOUS | Status: AC
  Administered 2020-02-24: 500 [IU]
  Filled 2020-02-24: qty 5

## 2020-02-24 NOTE — Progress Notes (Signed)
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     Cycles 1 through 4: A cycle is every 21 days:     Durvalumab      Carboplatin      Etoposide    Cycles 5 and beyond: A cycle is every 28 days:     Durvalumab   **Always confirm dose/schedule in your pharmacy ordering system**  REASON: Disease Progression PRIOR TREATMENT: [id number]: Durvalumab 1,500 mg D1 + Carboplatin AUC=5 D1 + Etoposide 100 mg/m2 D1-3 q21 Days x 4 Cycles, Followed by Durvalumab 1,500 mg q28 Days Until Progression or Unacceptable Toxicity TREATMENT RESPONSE: Progressive Disease (PD)  START ON PATHWAY REGIMEN - Small Cell Lung     A cycle is every 21 days:     Lurbinectedin   **Always confirm dose/schedule in your pharmacy ordering system**  Patient Characteristics: Relapsed or Progressive Disease, Second Line, Relapse < 3 Months Therapeutic Status: Relapsed or Progressive Disease Line of Therapy: Second Line Time to Relapse: Relapse < 3 Months Intent of Therapy: Non-Curative / Palliative Intent, Discussed with Patient

## 2020-02-24 NOTE — Progress Notes (Signed)
[place] [phone]   [phone]  OFFICE PROGRESS NOTE  [name], DO [address] [place] [address]  DIAGNOSIS: Extensive stage small cell lung cancer,the patient presented with a large left central left upper lobe lung mass invading the left hilum and mediastinum with extensive left-sided pleural metastatic disease and a malignant left pleural effusion. The patient had associated upper abdominal lymphadenopathy. She was diagnosed in August2021.  PRIOR THERAPY: Palliative systemic chemotherapy with carboplatin for an AUC of 5 on day 1, and etoposide 100 mg/m on days 1, 2, and 3 and Imfinzi 1500 mg IV every 3 weeks withCosela for myeloprotection. First dose expected on 12/02/2019.  Status post 4 cycles.  Last dose was giving February 06, 2020 discontinued secondary to disease progression.  CURRENT THERAPY:  Second line treatment with Lurbinectedin 3.2 mg/M2 every 3 weeks.  First dose March 02, 2020.  INTERVAL HISTORY: [name] [name] 64 y.o. female returns to the clinic today for follow-up visit accompanied by her brother.  The patient continues to complain of increasing fatigue and weakness as well as shortness of breath at baseline increased with exertion with left-sided chest pain that is not responding to the current as needed medication.  She denied having any hemoptysis but has mild cough.  She denied having any fever or chills.  She has no nausea, vomiting, diarrhea or constipation.  She has no headache or visual changes.  She lost few pounds since her last visit.  She had repeat CT scan of the chest, abdomen pelvis performed recently and she is here for evaluation and discussion of her scan results.   MEDICAL HISTORY: Past Medical History:  Diagnosis Date  . Allergy   . 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 [place] ([place])   . Carcinoid tumor of abdomen   . Cataract    both eyes  . Family history of adverse  reaction to anesthesia    Mother had trouble waking up  . Hypertension   . Lower back pain   . Neurofibromatosis, type I (von Recklinghausen's disease) ([place])   . Pneumothorax on left   . Pneumothorax on right     ALLERGIES:  has No Known Allergies.  MEDICATIONS:  Current Outpatient Medications  Medication Sig Dispense Refill  . albuterol (VENTOLIN HFA) 108 (90 Base) MCG/ACT inhaler Inhale 2 puffs into the lungs every 6 (six) hours as needed for wheezing or shortness of breath. (Patient not taking: Reported on 01/13/2020) 18 g 6  . AYR SALINE NASAL NA Place 1 spray into the nose daily as needed (dryness). (Patient not taking: Reported on 01/13/2020)    . Cholecalciferol (D3) 50 MCG (2000 UT) TABS Take 2,000 Units by mouth daily with breakfast.     . HYDROcodone-acetaminophen (NORCO/VICODIN) 5-325 MG tablet Take 1 tablet by mouth every 6 (six) hours as needed for moderate pain. 40 tablet 0  . lidocaine-prilocaine (EMLA) cream Apply to the Port-A-Cath site 30-60 minutes before treatment. (Patient not taking: Reported on 01/13/2020) 30 g 0  . metoprolol tartrate (LOPRESSOR) 25 MG tablet Take 0.5 tablets (12.5 mg total) by mouth 2 (two) times daily. 90 tablet 3  . Multiple Vitamins-Minerals (MULTIVITAMINS THER. W/MINERALS) TABS Take 1 tablet by mouth daily.    . prochlorperazine (COMPAZINE) 10 MG tablet TAKE 1 TABLET BY MOUTH EVERY 6 HOURS AS NEEDED FOR NAUSEA FOR VOMITING 30 tablet 0  . Tiotropium Bromide Monohydrate (  SPIRIVA RESPIMAT) 2.5 MCG/ACT AERS Inhale 2 puffs into the lungs daily. 4 g 5  . Tiotropium Bromide-Olodaterol (STIOLTO RESPIMAT) 2.5-2.5 MCG/ACT AERS Inhale 2 puffs into the lungs daily. 4 g 5  . traZODone (DESYREL) 50 MG tablet Take 1 tablet (50 mg total) by mouth at bedtime as needed for sleep. 30 tablet 3   No current facility-administered medications for this visit.    SURGICAL HISTORY:  Past Surgical History:  Procedure Laterality Date  . ABDOMINAL HYSTERECTOMY  04/23/1997     fibroids  TAH/ BSO  . CHOLECYSTECTOMY    . COLONOSCOPY  2006  . EYE SURGERY Left 11/2015   cataract and astigmatism surgery with Intraocular Lens left eye  . IR IMAGING GUIDED PORT INSERTION  12/07/2019  . LASIK    . LUNG SURGERY  2008?   d/t pneumothroax  . MEMBRANE PEEL Left 08/16/2014   Procedure: MEMBRANE PEEL LEFT EYE ;  Surgeon: Sherlynn Stalls, MD;  Location: [facility];  Service: Ophthalmology;  Laterality: Left;  . OOPHORECTOMY    . PARS PLANA VITRECTOMY Left 08/16/2014   Procedure: PARS PLANA VITRECTOMY WITH 25 GAUGE;  Surgeon: Sherlynn Stalls, MD;  Location: [facility];  Service: Ophthalmology;  Laterality: Left;  . TONSILLECTOMY    . WHIPPLE PROCEDURE      REVIEW OF SYSTEMS:  Constitutional: positive for fatigue and weight loss Eyes: negative Ears, nose, mouth, throat, and face: negative Respiratory: positive for cough, dyspnea on exertion and pleurisy/chest pain Cardiovascular: negative Gastrointestinal: negative Genitourinary:negative Integument/breast: negative Hematologic/lymphatic: negative Musculoskeletal:positive for bone pain Neurological: negative Behavioral/Psych: negative Endocrine: negative Allergic/Immunologic: negative   PHYSICAL EXAMINATION: General appearance: alert, cooperative, fatigued and no distress Head: Normocephalic, without obvious abnormality, atraumatic Neck: no adenopathy, no JVD, supple, symmetrical, trachea midline and thyroid not enlarged, symmetric, no tenderness/mass/nodules Lymph nodes: Cervical, supraclavicular, and axillary nodes normal. Resp: clear to auscultation bilaterally Back: symmetric, no curvature. ROM normal. No CVA tenderness. Cardio: regular rate and rhythm, S1, S2 normal, no murmur, click, rub or gallop GI: soft, non-tender; bowel sounds normal; no masses,  no organomegaly Extremities: extremities normal, atraumatic, no cyanosis or edema Neurologic: Alert and oriented X 3, normal strength and tone. Normal symmetric reflexes.  Normal coordination and gait  ECOG PERFORMANCE STATUS: 1 - Symptomatic but completely ambulatory  Blood pressure 136/73, pulse 92, temperature (!) 96.7 F (35.9 C), resp. rate 18, height 5\' 2"  (1.575 m), weight 102 lb 3.2 oz (46.4 kg), SpO2 97 %.  LABORATORY DATA: Lab Results  Component Value Date   WBC 11.6 (H) 02/24/2020   HGB 10.7 (L) 02/24/2020   HCT 32.5 (L) 02/24/2020   MCV 86.4 02/24/2020   PLT 634 (H) 02/24/2020      Chemistry      Component Value Date/Time   NA 132 (L) 02/17/2020 0933   K 4.4 02/17/2020 0933   CL 102 02/17/2020 0933   CO2 25 02/17/2020 0933   BUN 17 02/17/2020 0933   CREATININE 0.62 02/17/2020 0933   CREATININE 0.63 07/11/2011 1016      Component Value Date/Time   CALCIUM 9.9 02/17/2020 0933   ALKPHOS 82 02/17/2020 0933   AST 22 02/17/2020 0933   ALT 23 02/17/2020 0933   BILITOT <0.2 (L) 02/17/2020 0933       RADIOGRAPHIC STUDIES: DG Chest 1 View  Result Date: 02/18/2020 CLINICAL DATA:  Post thoracentesis EXAM: CHEST  1 VIEW COMPARISON:  February 03, 2020 FINDINGS: Left pleural effusion is significantly smaller following thoracentesis. No pneumothorax. Focal bulla  noted in the left apex. There is scarring and pleural thickening in each apex, stable. There is a small residual left pleural effusion with atelectatic change in the left base. There is slight right base atelectasis. No edema or airspace opacity noted. Port-A-Cath tip is in the superior vena cava. Heart size is normal. The pulmonary vascularity is within normal limits. No adenopathy appreciable. There is aortic atherosclerosis. No bone lesions. IMPRESSION: No pneumothorax. Left pleural effusion largely resolved after thoracentesis. Small residual left pleural effusion noted. Stable scarring and bullous disease in the apices. Stable Port-A-Cath positioning. Stable cardiac silhouette. Bibasilar atelectasis without edema or airspace consolidation. Aortic Atherosclerosis (ICD10-I70.0).  Electronically Signed   By: Lowella Grip III M.D.   On: 02/18/2020 10:26   DG Chest 2 View  Result Date: 02/05/2020 CLINICAL DATA:  Small cell left lung cancer. Decreased breath sounds on the left. History of pleural effusion and pleural nodularity. EXAM: CHEST - 2 VIEW COMPARISON:  11/24/2019 chest radiograph. FINDINGS: Right internal jugular Port-A-Cath terminates at the cavoatrial junction. Stable cardiomediastinal silhouette with normal heart size. No pneumothorax. Moderate left pleural effusion appears similar. No significant right pleural effusion. No overt pulmonary edema. Dense left lung base opacity, unchanged. No new lung opacity. IMPRESSION: Stable moderate left pleural effusion and dense left lung base opacity, compatible with a combination of tumor, post treatment change and/or atelectasis. Electronically Signed   By: Ilona Sorrel M.D.   On: 02/05/2020 17:00   CT Chest W Contrast  Result Date: 02/22/2020 CLINICAL DATA:  Small cell lung cancer, history of Whipple procedure. Chemotherapy in progress. EXAM: CT CHEST, ABDOMEN, AND PELVIS WITH CONTRAST TECHNIQUE: Multidetector CT imaging of the chest, abdomen and pelvis was performed following the standard protocol during bolus administration of intravenous contrast. CONTRAST:  159mL OMNIPAQUE IOHEXOL 300 MG/ML  SOLN COMPARISON:  01/11/2020. FINDINGS: CT CHEST FINDINGS Cardiovascular: Right IJ Port-A-Cath terminates in the high right atrium. Atherosclerotic calcification of the aorta and coronary arteries. Heart size within normal limits. No pericardial effusion. Mediastinum/Nodes: High right paratracheal lymph node has increased in size in the interval, now measuring 8 mm (2/18), previously 3 mm. Subcarinal nodal mass has also enlarged, now measuring 2.1 cm, previously 1.5 cm. Left hilar nodal mass has also enlarged slightly, now measuring 2.6 x 3.7 cm, compared to 2.3 x 3.5 cm on 01/11/2020. No right hilar or axillary adenopathy. Esophagus is  grossly unremarkable. Periesophageal lymph node measures 8 mm (2/37), increased from 5 mm. Lungs/Pleura: Moderate left pleural effusion, similar, with increasing pleural nodularity. Index nodule in the posteromedial inferior left hemithorax measures 1.8 x 2.7 cm (2/56), compared to 1.6 x 2.3 cm on 01/11/2020. Associated collapse/consolidation in the left lower lobe, similar. Extensive bullous paraseptal and centrilobular emphysema. Biapical pleuroparenchymal scarring. Lungs are otherwise clear. There is narrowing and/or obstruction of left lower lobe bronchi due to left hilar adenopathy. Musculoskeletal: Degenerative changes in the spine. No worrisome lytic or sclerotic lesions. CT ABDOMEN PELVIS FINDINGS Hepatobiliary: Subcentimeter low-attenuation lesion in the right hepatic lobe is unchanged but too small to characterize. Pneumobilia, as before. High attenuation associated with the posterior margin of the gallbladder appears to be external upon review of coronal and sagittal images. Mild biliary ductal dilatation, unchanged. Pancreas: Pancreatico duodenectomy. Spleen: Negative. Adrenals/Urinary Tract: Right adrenal gland is unremarkable. Heterogeneous left adrenal mass is chronically stable, measuring 2.3 x 2.6 cm. Right kidney is unremarkable. Low-attenuation lesions in the left kidney measure up to 12 mm, too small to characterize. Ureters are decompressed. Bladder is  low in volume. Stomach/Bowel: Stomach is unremarkable. Pancreatico duodenectomy. Small bowel, appendix and colon are otherwise unremarkable. Vascular/Lymphatic: Atherosclerotic calcification of the aorta. Left periaortic lymph node has enlarged, now measuring 1.9 x 2.4 cm (2/62), compared to 9 x 13 mm on 01/11/2020. No additional pathologically enlarged lymph nodes. Reproductive: Hysterectomy.  No adnexal mass. Other: Cutaneous and subcutaneous nodules, including focal nodular thickening of the left gluteal fold (2/112), unchanged and consistent  with a known diagnosis neurofibromatosis. No free fluid. Mesenteries and peritoneum are otherwise unremarkable. Musculoskeletal: No worrisome lytic or sclerotic lesions. Degenerative changes in the spine. IMPRESSION: 1. Interval progression of extensive stage small cell lung cancer as evidenced by enlarging mediastinal/left hilar adenopathy, progressive pleural nodularity in the left hemithorax and an enlarging periaortic lymph node in the abdominal retroperitoneum. 2. Moderate left pleural effusion and associated collapse/consolidation in the left lower lobe, similar. 3. Stable left adrenal nodule, characterized as an adenoma on prior exams. 4. Aortic atherosclerosis (ICD10-I70.0). Coronary artery calcification. 5.  Emphysema (ICD10-J43.9). Electronically Signed   By: Lorin Picket M.D.   On: 02/22/2020 09:47   MR Brain W Wo Contrast  Result Date: 02/17/2020 CLINICAL DATA:  Small cell lung cancer.  Staging. EXAM: MRI HEAD WITHOUT AND WITH CONTRAST TECHNIQUE: Multiplanar, multiecho pulse sequences of the brain and surrounding structures were obtained without and with intravenous contrast. CONTRAST:  66mL GADAVIST GADOBUTROL 1 MMOL/ML IV SOLN COMPARISON:  11/27/2019 FINDINGS: Brain: Diffusion imaging does not show any acute or subacute infarction. Chronic small-vessel ischemic changes affect the pons and cerebral hemispheric white matter. 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 No large vessel territory infarction. No sign of intracranial metastatic disease. Small focus of enhancement in the right cerebellum question previously represents a small vessel. Small developmental venous anomalies in the right base of the brain and the left superior frontal gyrus are unchanged. No hydrocephalus, hemorrhage or extra-axial collection. Vascular: Major vessels at the base of the brain show flow. Skull and upper cervical spine: Negative Sinuses/Orbits: Clear/normal Other: None IMPRESSION: 1. No evidence of  metastatic disease. 2. Chronic small-vessel ischemic changes as outlined above. 3. Vascular signal as questioned previously does not show any change or evidence that they represent metastatic lesions. Electronically Signed   By: Nelson Chimes M.D.   On: 02/17/2020 16:16   CT Abdomen Pelvis W Contrast  Result Date: 02/22/2020 CLINICAL DATA:  Small cell lung cancer, history of Whipple procedure. Chemotherapy in progress. EXAM: CT CHEST, ABDOMEN, AND PELVIS WITH CONTRAST TECHNIQUE: Multidetector CT imaging of the chest, abdomen and pelvis was performed following the standard protocol during bolus administration of intravenous contrast. CONTRAST:  122mL OMNIPAQUE IOHEXOL 300 MG/ML  SOLN COMPARISON:  01/11/2020. FINDINGS: CT CHEST FINDINGS Cardiovascular: Right IJ Port-A-Cath terminates in the high right atrium. Atherosclerotic calcification of the aorta and coronary arteries. Heart size within normal limits. No pericardial effusion. Mediastinum/Nodes: High right paratracheal lymph node has increased in size in the interval, now measuring 8 mm (2/18), previously 3 mm. Subcarinal nodal mass has also enlarged, now measuring 2.1 cm, previously 1.5 cm. Left hilar nodal mass has also enlarged slightly, now measuring 2.6 x 3.7 cm, compared to 2.3 x 3.5 cm on 01/11/2020. No right hilar or axillary adenopathy. Esophagus is grossly unremarkable. Periesophageal lymph node measures 8 mm (2/37), increased from 5 mm. Lungs/Pleura: Moderate left pleural effusion, similar, with increasing pleural nodularity. Index nodule in the posteromedial inferior left hemithorax measures 1.8 x 2.7 cm (2/56), compared to 1.6 x 2.3 cm on 01/11/2020.  Associated collapse/consolidation in the left lower lobe, similar. Extensive bullous paraseptal and centrilobular emphysema. Biapical pleuroparenchymal scarring. Lungs are otherwise clear. There is narrowing and/or obstruction of left lower lobe bronchi due to left hilar adenopathy. Musculoskeletal:  Degenerative changes in the spine. No worrisome lytic or sclerotic lesions. CT ABDOMEN PELVIS FINDINGS Hepatobiliary: Subcentimeter low-attenuation lesion in the right hepatic lobe is unchanged but too small to characterize. Pneumobilia, as before. High attenuation associated with the posterior margin of the gallbladder appears to be external upon review of coronal and sagittal images. Mild biliary ductal dilatation, unchanged. Pancreas: Pancreatico duodenectomy. Spleen: Negative. Adrenals/Urinary Tract: Right adrenal gland is unremarkable. Heterogeneous left adrenal mass is chronically stable, measuring 2.3 x 2.6 cm. Right kidney is unremarkable. Low-attenuation lesions in the left kidney measure up to 12 mm, too small to characterize. Ureters are decompressed. Bladder is low in volume. Stomach/Bowel: Stomach is unremarkable. Pancreatico duodenectomy. Small bowel, appendix and colon are otherwise unremarkable. Vascular/Lymphatic: Atherosclerotic calcification of the aorta. Left periaortic lymph node has enlarged, now measuring 1.9 x 2.4 cm (2/62), compared to 9 x 13 mm on 01/11/2020. No additional pathologically enlarged lymph nodes. Reproductive: Hysterectomy.  No adnexal mass. Other: Cutaneous and subcutaneous nodules, including focal nodular thickening of the left gluteal fold (2/112), unchanged and consistent with a known diagnosis neurofibromatosis. No free fluid. Mesenteries and peritoneum are otherwise unremarkable. Musculoskeletal: No worrisome lytic or sclerotic lesions. Degenerative changes in the spine. IMPRESSION: 1. Interval progression of extensive stage small cell lung cancer as evidenced by enlarging mediastinal/left hilar adenopathy, progressive pleural nodularity in the left hemithorax and an enlarging periaortic lymph node in the abdominal retroperitoneum. 2. Moderate left pleural effusion and associated collapse/consolidation in the left lower lobe, similar. 3. Stable left adrenal nodule,  characterized as an adenoma on prior exams. 4. Aortic atherosclerosis (ICD10-I70.0). Coronary artery calcification. 5.  Emphysema (ICD10-J43.9). Electronically Signed   By: Lorin Picket M.D.   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 Request is made for therapeutic left thoracentesis. EXAM: ULTRASOUND GUIDED THERAPEUTIC LEFT THORACENTESIS MEDICATIONS: 8 mL 1% lidocaine COMPLICATIONS: None immediate. PROCEDURE: An ultrasound guided thoracentesis was thoroughly discussed with the patient and questions answered. The benefits, risks, alternatives and complications were also discussed. The patient understands and wishes to proceed with the procedure. Written consent was obtained. Ultrasound was performed to localize and mark an adequate pocket of fluid in the left chest. The area was then prepped and draped in the normal sterile fashion. 1% Lidocaine was used for local anesthesia. Under ultrasound guidance a 6 Fr Safe-T-Centesis catheter was introduced. Thoracentesis was performed. The catheter was removed and a dressing applied. FINDINGS: A total of approximately 650 mL of hazy gold fluid was removed. IMPRESSION: Successful ultrasound guided left thoracentesis yielding 650 mL of pleural fluid. Read by: Earley Abide, PA-C Electronically Signed   By: Jacqulynn Cadet M.D.   On: 02/18/2020 12:39    ASSESSMENT AND PLAN: This is a very pleasant 64 years old white female recently diagnosed with extensive stage small cell lung cancer presented with large left central upper lobe lung mass invading the left hilum and mediastinum with extensive left-sided pleural metastatic disease as well as malignant left pleural effusion and upper abdominal lymphadenopathy diagnosed in August 2021. The patient is currently  undergoing a course of systemic chemotherapy with carboplatin for  AUC of 5 on day 1, etoposide 100 mg/M2 on days 1, 2 and 3 with Cosela on the days of chemotherapy.  She is status post 4 cycles.  The patient tolerated these 4 cycles fairly well except for fatigue and mild nausea. She had repeat CT scan of the chest, abdomen pelvis performed recently.  I personally and independently reviewed the scans and discussed the result and showed the images to the patient and her brother. Unfortunately her scan showed evidence for disease progression while she is on chemotherapy which is concerning. I had a lengthy discussion with the patient and her brother about her current condition and treatment options. I explained to the patient the poor prognosis of her condition especially with refractory small cell lung cancer.  I gave her the option of palliative care and hospice versus consideration of palliative systemic chemotherapy with second line treatment with Lurbinectedin 3.2 mg/M2 every 3 weeks. The patient is interested in proceeding with systemic chemotherapy. She is expected to start the first cycle of this treatment next week. I discussed with her the adverse effect of this treatment including but not limited to alopecia, myelosuppression, nausea and vomiting, peripheral neuropathy, liver or renal dysfunction. She will come back for follow-up visit in 2 weeks for evaluation and management of any adverse effect of her treatment. For pain management I will start the patient on long-acting pain medication with MS Contin and she will continue on Norco for breakthrough pain. The patient was advised to call immediately if she has any other concerning symptoms in the interval. The patient voices understanding of current disease status and treatment options and is in agreement with the current care plan.  All questions were answered. The patient knows to call the clinic with any problems, questions or concerns. We  can certainly see the patient much sooner if necessary.  The total time spent in the appointment was 50 minutes.  Disclaimer: This note was dictated with voice recognition software. Similar sounding words can inadvertently be transcribed and may not be corrected upon review.

## 2020-03-02 ENCOUNTER — Inpatient Hospital Stay: Payer: PPO

## 2020-03-02 ENCOUNTER — Other Ambulatory Visit: Payer: Self-pay

## 2020-03-02 VITALS — BP 106/49 | HR 69 | Temp 97.9°F | Resp 17

## 2020-03-02 DIAGNOSIS — C3492 Malignant neoplasm of unspecified part of left bronchus or lung: Secondary | ICD-10-CM

## 2020-03-02 DIAGNOSIS — J439 Emphysema, unspecified: Secondary | ICD-10-CM | POA: Diagnosis not present

## 2020-03-02 DIAGNOSIS — Z95828 Presence of other vascular implants and grafts: Secondary | ICD-10-CM

## 2020-03-02 DIAGNOSIS — Z5111 Encounter for antineoplastic chemotherapy: Secondary | ICD-10-CM | POA: Diagnosis not present

## 2020-03-02 LAB — CBC WITH DIFFERENTIAL (CANCER CENTER ONLY)
Abs Immature Granulocytes: 0.35 10*3/uL — ABNORMAL HIGH (ref 0.00–0.07)
Basophils Absolute: 0.1 10*3/uL (ref 0.0–0.1)
Basophils Relative: 1 %
Eosinophils Absolute: 0.2 10*3/uL (ref 0.0–0.5)
Eosinophils Relative: 1 %
HCT: 32 % — ABNORMAL LOW (ref 36.0–46.0)
Hemoglobin: 10.3 g/dL — ABNORMAL LOW (ref 12.0–15.0)
Immature Granulocytes: 2 %
Lymphocytes Relative: 14 %
Lymphs Abs: 2.3 10*3/uL (ref 0.7–4.0)
MCH: 28.6 pg (ref 26.0–34.0)
MCHC: 32.2 g/dL (ref 30.0–36.0)
MCV: 88.9 fL (ref 80.0–100.0)
Monocytes Absolute: 2.3 10*3/uL — ABNORMAL HIGH (ref 0.1–1.0)
Monocytes Relative: 14 %
Neutro Abs: 11.9 10*3/uL — ABNORMAL HIGH (ref 1.7–7.7)
Neutrophils Relative %: 68 %
Platelet Count: 562 10*3/uL — ABNORMAL HIGH (ref 150–400)
RBC: 3.6 MIL/uL — ABNORMAL LOW (ref 3.87–5.11)
RDW: 18.1 % — ABNORMAL HIGH (ref 11.5–15.5)
WBC Count: 17.1 10*3/uL — ABNORMAL HIGH (ref 4.0–10.5)
nRBC: 0 % (ref 0.0–0.2)

## 2020-03-02 LAB — CMP (CANCER CENTER ONLY)
ALT: 16 U/L (ref 0–44)
AST: 25 U/L (ref 15–41)
Albumin: 2.8 g/dL — ABNORMAL LOW (ref 3.5–5.0)
Alkaline Phosphatase: 89 U/L (ref 38–126)
Anion gap: 9 (ref 5–15)
BUN: 15 mg/dL (ref 8–23)
CO2: 26 mmol/L (ref 22–32)
Calcium: 9.1 mg/dL (ref 8.9–10.3)
Chloride: 99 mmol/L (ref 98–111)
Creatinine: 0.69 mg/dL (ref 0.44–1.00)
GFR, Estimated: >60 mL/min (ref >60)
Glucose, Bld: 107 mg/dL — ABNORMAL HIGH (ref 70–99)
Potassium: 4.2 mmol/L (ref 3.5–5.1)
Sodium: 134 mmol/L — ABNORMAL LOW (ref 135–145)
Total Bilirubin: 0.3 mg/dL (ref 0.3–1.2)
Total Protein: 6.9 g/dL (ref 6.5–8.1)

## 2020-03-02 MED ORDER — SODIUM CHLORIDE 0.9% FLUSH
10.0000 mL | INTRAVENOUS | Status: DC | PRN
  Administered 2020-03-02: 10 mL
  Filled 2020-03-02: qty 10

## 2020-03-02 MED ORDER — PALONOSETRON HCL INJECTION 0.25 MG/5ML
0.2500 mg | Freq: Once | INTRAVENOUS | Status: AC
  Administered 2020-03-02: 0.25 mg via INTRAVENOUS

## 2020-03-02 MED ORDER — SODIUM CHLORIDE 0.9% FLUSH
10.0000 mL | Freq: Once | INTRAVENOUS | Status: AC
  Administered 2020-03-02: 10 mL via INTRAVENOUS
  Filled 2020-03-02: qty 10

## 2020-03-02 MED ORDER — ALTEPLASE 2 MG IJ SOLR
INTRAMUSCULAR | Status: AC
  Filled 2020-03-02: qty 2

## 2020-03-02 MED ORDER — SODIUM CHLORIDE 0.9 % IV SOLN
10.0000 mg | Freq: Once | INTRAVENOUS | Status: AC
  Administered 2020-03-02: 10 mg via INTRAVENOUS
  Filled 2020-03-02: qty 10

## 2020-03-02 MED ORDER — ALTEPLASE 2 MG IJ SOLR
2.0000 mg | Freq: Once | INTRAMUSCULAR | Status: AC
  Administered 2020-03-02: 2 mg
  Filled 2020-03-02: qty 2

## 2020-03-02 MED ORDER — PALONOSETRON HCL INJECTION 0.25 MG/5ML
INTRAVENOUS | Status: AC
  Filled 2020-03-02: qty 5

## 2020-03-02 MED ORDER — SODIUM CHLORIDE 0.9 % IV SOLN
Freq: Once | INTRAVENOUS | Status: AC
  Filled 2020-03-02: qty 250

## 2020-03-02 MED ORDER — HEPARIN SOD (PORK) LOCK FLUSH 100 UNIT/ML IV SOLN
500.0000 [IU] | Freq: Once | INTRAVENOUS | Status: AC | PRN
  Administered 2020-03-02: 500 [IU]
  Filled 2020-03-02: qty 5

## 2020-03-02 MED ORDER — SODIUM CHLORIDE 0.9 % IV SOLN
3.2000 mg/m2 | Freq: Once | INTRAVENOUS | Status: AC
  Administered 2020-03-02: 4.55 mg via INTRAVENOUS
  Filled 2020-03-02: qty 9.1

## 2020-03-02 NOTE — Patient Instructions (Signed)

## 2020-03-02 NOTE — Patient Instructions (Signed)
[facility] Discharge Instructions for Patients Receiving Chemotherapy  Today you received the following chemotherapy agents: Zepzelca  To help prevent nausea and vomiting after your treatment, we encourage you to take your nausea medication as directed   If you develop nausea and vomiting that is not controlled by your nausea medication, call the clinic.   BELOW ARE SYMPTOMS THAT SHOULD BE REPORTED IMMEDIATELY:  *FEVER GREATER THAN 100.5 F  *CHILLS WITH OR WITHOUT FEVER  NAUSEA AND VOMITING THAT IS NOT CONTROLLED WITH YOUR NAUSEA MEDICATION  *UNUSUAL SHORTNESS OF BREATH  *UNUSUAL BRUISING OR BLEEDING  TENDERNESS IN MOUTH AND THROAT WITH OR WITHOUT PRESENCE OF ULCERS  *URINARY PROBLEMS  *BOWEL PROBLEMS  UNUSUAL RASH Items with * indicate a potential emergency and should be followed up as soon as possible.  Feel free to call the clinic should you have any questions or concerns. The clinic phone number is [phone].  Please show the [facility] at check-in to the Emergency Department and triage nurse.  Lurbinectedin Injection What is this medicine? LURBINECTEDIN (LOOR bin EK te din) is a chemotherapy drug. This medicine is used to treat lung cancer. This medicine may be used for other purposes; ask your health care provider or pharmacist if you have questions. COMMON BRAND NAME(S): ZEPZELCA What should I tell my health care provider before I take this medicine? They need to know if you have any of these conditions:  infection (especially a viral infection such as chickenpox, cold sores, or herpes)  liver disease  low blood counts, like white cells, platelets, or red blood cells  an unusual or allergic reaction to lurbinectedin, other medicines, foods, dyes or preservatives  pregnant or trying to get pregnant  breast-feeding How should I use this medicine? This medicine is for infusion into a vein. It is given by a healthcare professional in  a hospital or clinic setting. Talk to your pediatrician about the use of this medicine in children. Special care may be needed. Overdosage: If you think you have taken too much of this medicine contact a poison control center or emergency room at once. NOTE: This medicine is only for you. Do not share this medicine with others. What if I miss a dose? Keep appointments for follow-up doses. It is important not to miss your dose. Call your doctor or health care professional if you are unable to keep an appointment. What may interact with this medicine?  certain antibiotics like erythromycin or clarithromycin  certain antivirals for HIV or hepatitis  certain medicines for fungal infections like ketoconazole, itraconazole, or posaconazole  certain medicines for seizures like carbamazepine, phenobarbital, phenytoin  grapefruit or grapefruit juice  St. John's Wort This list may not describe all possible interactions. Give your health care provider a list of all the medicines, herbs, non-prescription drugs, or dietary supplements you use. Also tell them if you smoke, drink alcohol, or use illegal drugs. Some items may interact with your medicine. What should I watch for while using this medicine? Your condition will be monitored carefully while you are receiving this medicine. Do not become pregnant while taking this medicine or for 6 months after stopping it. Women should inform their health care professional if they wish to become pregnant or think they might be pregnant. Men should not father a child while taking this medicine and for 4 months after stopping it. There is potential for serious side effects to an unborn child. Talk to your health care professional for more  information. Do not breast-feed a child while taking this medicine or for 2 weeks after stopping it. Call your health care professional for advice if you get a fever, chills, or sore throat, or other symptoms of a cold or flu. Do  not treat yourself. This medicine decreases your body's ability to fight infections. Try to avoid being around people who are sick. Avoid taking medicines that contain aspirin, acetaminophen, ibuprofen, naproxen, or ketoprofen unless instructed by your health care professional. These medicines may hide a fever. Be careful brushing or flossing your teeth or using a toothpick because you may get an infection or bleed more easily. If you have any dental work done, tell your dentist you are receiving this medicine. What side effects may I notice from receiving this medicine? Side effects that you should report to your doctor or health care professional as soon as possible:  allergic reactions like skin rash, itching or hives; swelling of the face, lips, or tongue  chest pain  nausea, vomiting  signs and symptoms of bleeding such as bloody or black, tarry stools; red or dark-brown urine; spitting up blood or brown material that looks like coffee grounds; red spots on the skin; unusual bruising or bleeding from the eyes, gums, or nose  signs and symptoms of infection like fever; chills; cough; sore throat; pain or trouble passing urine  signs and symptoms of liver injury like dark yellow or brown urine; general ill feeling or flu-like symptoms; light-colored stools; loss of appetite; nausea; right upper belly pain; unusually weak or tired; yellowing of the eyes or skin Side effects that usually do not require medical attention (report these to your doctor or health care professional if they continue or are bothersome):  changes in taste  constipation  diarrhea  loss of appetite  muscle pain  pain, tingling, numbness in the hands or feet  signs and symptoms of high blood sugar such as being more thirsty or hungry or having to urinate more than normal. You may also feel very tired or have blurry vision.  signs and symptoms of low magnesium like muscle cramps; muscle pain; muscle weakness;  tremors; seizures; or fast, irregular heartbeat  signs and symptoms of low red blood cells or anemia such as unusually weak or tired; feeling faint or lightheaded; falls; breathing problems  stomach pain This list may not describe all possible side effects. Call your doctor for medical advice about side effects. You may report side effects to FDA at 1-800-FDA-1088. Where should I keep my medicine? This medicine is given in a hospital or clinic and will not be stored at home. NOTE: This sheet is a summary. It may not cover all possible information. If you have questions about this medicine, talk to your doctor, pharmacist, or health care provider.  2020 [employer] (2018-10-09 13:00:33)

## 2020-03-03 ENCOUNTER — Encounter: Payer: Self-pay | Admitting: Internal Medicine

## 2020-03-03 ENCOUNTER — Encounter: Payer: Self-pay | Admitting: Medical Oncology

## 2020-03-03 ENCOUNTER — Telehealth: Payer: Self-pay | Admitting: *Deleted

## 2020-03-03 NOTE — Telephone Encounter (Signed)
"  tremors"-  Last night she started to fall asleep and was awoken by tremor in left hand. Lasted last that 1 minute. Happened 3 times. Itching left ankle-she has benadryl if needed and oxygen. I instructed her to go to ED if symptoms progress with chest pain . Difficulty breathing and or swelling. I instructed her to monitor symptoms now.

## 2020-03-04 ENCOUNTER — Other Ambulatory Visit: Payer: Self-pay | Admitting: Internal Medicine

## 2020-03-04 DIAGNOSIS — Z5111 Encounter for antineoplastic chemotherapy: Secondary | ICD-10-CM

## 2020-03-07 NOTE — Progress Notes (Signed)
[facility] OFFICE PROGRESS NOTE  [name], DO [address] [place] [address]  DIAGNOSIS: Extensive stage small cell lung cancer,the patient presented with a large left central left upper lobe lung mass invading the left hilum and mediastinum with extensive left-sided pleural metastatic disease and a malignant left pleural effusion. The patient had associated upper abdominal lymphadenopathy. She was diagnosed in August2021.  PRIOR THERAPY: Palliative systemic chemotherapy with carboplatin for an AUC of 5 on day 1, and etoposide 100 mg/m on days 1, 2, and 3 and Imfinzi 1500 mg IV every 3 weeks withCosela for myeloprotection. First dose expected on 12/02/2019.Status post 4 cycles.  Last dose was giving February 06, 2020 discontinued secondary to disease progression.  CURRENT THERAPY: Second line treatment with Lurbinectedin 3.2 mg/M2 every 3 weeks.  First dose March 02, 2020. Status post 1 cycle.   INTERVAL HISTORY: [name] [name] 64 y.o. female returns to the clinic today for a follow up visit accompanied by her brother. The patient recently had evidence of disease progression after 4 cycles of chemotherapy/immunotherapy and her treatment was subsequently switched to [place]. She underwent her first cycle of treatment last week and she had side effects. She reported nausea/vomiting for about 4 days following treatment. She also had fatigue and tremors x2. She also reports decreased appetite and taste alterations. No evidence of thrush. She started feeling better about 2 days ago and is feeling fairly well today.    She denies fever, chills, night sweats, or weight loss. She resumed using her supplemental oxygen she is currently on 3 liters of oxygen. They monitor her oxygen saturation at home. She desats during activity. It takes her a few minutes to recover. She has dyspnea on exertion. She reports left sided chest/rib discomfort for which  she is currently prescribed MS Contin. Her pain control is much better. She also takes tylenol BID>  She reports dyspnea on exertion and a mild cough.  She denies any hemoptysis.  She had some constipation likely secondary to the pain medication use. They have used Doculax with improvement. She denies any headaches or visual changes. The patient is here today for evaluation and repeat blood work in for a 1 week follow-up visit to see how she managed her first cycle of her new treatment.  MEDICAL HISTORY: Past Medical History:  Diagnosis Date  . Allergy   . 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 Cancer ([place])   . Carcinoid tumor of abdomen   . Cataract    both eyes  . Family history of adverse reaction to anesthesia    Mother had trouble waking up  . Hypertension   . Lower back pain   . Neurofibromatosis, type I (von Recklinghausen's disease) ([place])   . Pneumothorax on left   . Pneumothorax on right     ALLERGIES:  has No Known Allergies.  MEDICATIONS:  Current Outpatient Medications  Medication Sig Dispense Refill  . albuterol (VENTOLIN HFA) 108 (90 Base) MCG/ACT inhaler Inhale 2 puffs into the lungs every 6 (six) hours as needed for wheezing or shortness of breath. 18 g 6  . Cholecalciferol (D3) 50 MCG (2000 UT) TABS Take 2,000 Units by mouth daily with breakfast.     . dexamethasone (DECADRON) 4 MG tablet Take 1 tablet twice a day for 5 days after chemotherapy 40 tablet 2  . HYDROcodone-acetaminophen (NORCO/VICODIN) 5-325 MG tablet Take 1 tablet by mouth every 6 (six)  hours as needed for moderate pain. 40 tablet 0  . morphine (MS CONTIN) 30 MG 12 hr tablet Take 1 tablet (30 mg total) by mouth every 12 (twelve) hours. 60 tablet 0  . Multiple Vitamins-Minerals (CENTRUM SILVER 50+WOMEN) TABS Take 1 tablet by mouth daily with breakfast.     . prochlorperazine (COMPAZINE) 10 MG tablet TAKE 1 TABLET BY MOUTH EVERY 6 HOURS AS NEEDED FOR NAUSEA FOR VOMITING 30 tablet 0  . traZODone (DESYREL) 50 MG  tablet Take 1 tablet (50 mg total) by mouth at bedtime as needed for sleep. 30 tablet 3  . AYR SALINE NASAL NA Place 1 spray into the nose daily as needed (dryness). (Patient not taking: Reported on 01/13/2020)    . lidocaine-prilocaine (EMLA) cream Apply to the Port-A-Cath site 30-60 minutes before treatment. (Patient not taking: Reported on 01/13/2020) 30 g 0  . metoprolol tartrate (LOPRESSOR) 25 MG tablet Take 0.5 tablets (12.5 mg total) by mouth 2 (two) times daily. 90 tablet 3  . Tiotropium Bromide-Olodaterol (STIOLTO RESPIMAT) 2.5-2.5 MCG/ACT AERS Inhale 2 puffs into the lungs daily. 4 g 5   No current facility-administered medications for this visit.    SURGICAL HISTORY:  Past Surgical History:  Procedure Laterality Date  . ABDOMINAL HYSTERECTOMY  04/23/1997   fibroids  TAH/ BSO  . CHOLECYSTECTOMY    . COLONOSCOPY  2006  . EYE SURGERY Left 11/2015   cataract and astigmatism surgery with Intraocular Lens left eye  . IR IMAGING GUIDED PORT INSERTION  12/07/2019  . LASIK    . LUNG SURGERY  2008?   d/t pneumothroax  . MEMBRANE PEEL Left 08/16/2014   Procedure: MEMBRANE PEEL LEFT EYE ;  Surgeon: Sherlynn Stalls, MD;  Location: [facility];  Service: Ophthalmology;  Laterality: Left;  . OOPHORECTOMY    . PARS PLANA VITRECTOMY Left 08/16/2014   Procedure: PARS PLANA VITRECTOMY WITH 25 GAUGE;  Surgeon: Sherlynn Stalls, MD;  Location: [facility];  Service: Ophthalmology;  Laterality: Left;  . TONSILLECTOMY    . WHIPPLE PROCEDURE      REVIEW OF SYSTEMS:   Review of Systems  Constitutional:Positive for fatigue and appetite change.Negative for chills and weight loss,and fever HENT: Negative for mouth sores, nosebleeds, sore throat and trouble swallowing.  Eyes: Negative for eye problems and icterus.  Respiratory:Positive for shortness of breath.Negative for hemoptysis and wheezing.  Cardiovascular:Positive for left lower rib pain (improved). Negative for leg swelling.  Gastrointestinal:Positive  for nausea/vomiting (resolved). Positive for LUQ/Left lower rib pain (controlled). Positive for constipation (controlled).Negative for diarrhea. Genitourinary: Negative for bladder incontinence, difficulty urinating, dysuria, frequency and hematuria.  Musculoskeletal:Negative for gait problem, neck pain and neck stiffness.  Skin: Negative for itching and rash.  Neurological: Negative for dizziness, extremity weakness, gait problem, headaches, light-headedness and seizures.  Hematological: Negative for adenopathy. Does not bruise/bleed easily.  Psychiatric/Behavioral: Negative for confusion, depression and sleep disturbance. The patient is not nervous/anxious.   PHYSICAL EXAMINATION:  Blood pressure (!) 131/57, pulse 72, temperature 97.7 F (36.5 C), temperature source Tympanic, resp. rate 18, height 5\' 2"  (1.575 m), weight 105 lb 9.6 oz (47.9 kg), SpO2 100 %.  ECOG PERFORMANCE STATUS: 2 - Symptomatic, <50% confined to bed  Physical Exam  Constitutional: Oriented to person, place, and time andcachetic appearing femaleand in no distress.  HENT:  Head: Normocephalic and atraumatic.neurofibromatosis associated lesions. Mouth/Throat: Oropharynx is clear and moist. No oropharyngeal exudate.  Eyes: Conjunctivae are normal. Right eye exhibits no discharge. Left eye exhibits no discharge. No scleral  icterus.  Neck: Normal range of motion. Neck supple.  Cardiovascular: Normal rate, regular rhythm, normal heart sounds and intact distal pulses.  Pulmonary/Chest: Effort normal. Decreased breath sounds in left lung. No respiratory distress. No wheezes. No rales.  Abdominal: Soft. Bowel sounds are normal. Exhibits no distension and no mass. There is no tenderness.  Musculoskeletal: Normal range of motion. Exhibits no edema.  Lymphadenopathy:  No cervical adenopathy.  Neurological: Alert and oriented to person, place, and time. Exhibits muscle wasting. Examined in the wheelchair. Skin: Skin  is warm and dry. No rash noted. Not diaphoretic. No erythema. No pallor.  Psychiatric: Mood, memory and judgment normal.  Vitals reviewed.  LABORATORY DATA: Lab Results  Component Value Date   WBC 11.8 (H) 03/09/2020   HGB 9.5 (L) 03/09/2020   HCT 29.4 (L) 03/09/2020   MCV 89.4 03/09/2020   PLT 440 (H) 03/09/2020      Chemistry      Component Value Date/Time   NA 134 (L) 03/02/2020 0746   K 4.2 03/02/2020 0746   CL 99 03/02/2020 0746   CO2 26 03/02/2020 0746   BUN 15 03/02/2020 0746   CREATININE 0.69 03/02/2020 0746   CREATININE 0.63 07/11/2011 1016      Component Value Date/Time   CALCIUM 9.1 03/02/2020 0746   ALKPHOS 89 03/02/2020 0746   AST 25 03/02/2020 0746   ALT 16 03/02/2020 0746   BILITOT 0.3 03/02/2020 0746       RADIOGRAPHIC STUDIES:  DG Chest 1 View  Result Date: 02/18/2020 CLINICAL DATA:  Post thoracentesis EXAM: CHEST  1 VIEW COMPARISON:  February 03, 2020 FINDINGS: Left pleural effusion is significantly smaller following thoracentesis. No pneumothorax. Focal bulla noted in the left apex. There is scarring and pleural thickening in each apex, stable. There is a small residual left pleural effusion with atelectatic change in the left base. There is slight right base atelectasis. No edema or airspace opacity noted. Port-A-Cath tip is in the superior vena cava. Heart size is normal. The pulmonary vascularity is within normal limits. No adenopathy appreciable. There is aortic atherosclerosis. No bone lesions. IMPRESSION: No pneumothorax. Left pleural effusion largely resolved after thoracentesis. Small residual left pleural effusion noted. Stable scarring and bullous disease in the apices. Stable Port-A-Cath positioning. Stable cardiac silhouette. Bibasilar atelectasis without edema or airspace consolidation. Aortic Atherosclerosis (ICD10-I70.0). Electronically Signed   By: Lowella Grip III M.D.   On: 02/18/2020 10:26   CT Chest W Contrast  Result Date:  02/22/2020 CLINICAL DATA:  Small cell lung cancer, history of Whipple procedure. Chemotherapy in progress. EXAM: CT CHEST, ABDOMEN, AND PELVIS WITH CONTRAST TECHNIQUE: Multidetector CT imaging of the chest, abdomen and pelvis was performed following the standard protocol during bolus administration of intravenous contrast. CONTRAST:  121mL OMNIPAQUE IOHEXOL 300 MG/ML  SOLN COMPARISON:  01/11/2020. FINDINGS: CT CHEST FINDINGS Cardiovascular: Right IJ Port-A-Cath terminates in the high right atrium. Atherosclerotic calcification of the aorta and coronary arteries. Heart size within normal limits. No pericardial effusion. Mediastinum/Nodes: High right paratracheal lymph node has increased in size in the interval, now measuring 8 mm (2/18), previously 3 mm. Subcarinal nodal mass has also enlarged, now measuring 2.1 cm, previously 1.5 cm. Left hilar nodal mass has also enlarged slightly, now measuring 2.6 x 3.7 cm, compared to 2.3 x 3.5 cm on 01/11/2020. No right hilar or axillary adenopathy. Esophagus is grossly unremarkable. Periesophageal lymph node measures 8 mm (2/37), increased from 5 mm. Lungs/Pleura: Moderate left pleural effusion, similar, with increasing  pleural nodularity. Index nodule in the posteromedial inferior left hemithorax measures 1.8 x 2.7 cm (2/56), compared to 1.6 x 2.3 cm on 01/11/2020. Associated collapse/consolidation in the left lower lobe, similar. Extensive bullous paraseptal and centrilobular emphysema. Biapical pleuroparenchymal scarring. Lungs are otherwise clear. There is narrowing and/or obstruction of left lower lobe bronchi due to left hilar adenopathy. Musculoskeletal: Degenerative changes in the spine. No worrisome lytic or sclerotic lesions. CT ABDOMEN PELVIS FINDINGS Hepatobiliary: Subcentimeter low-attenuation lesion in the right hepatic lobe is unchanged but too small to characterize. Pneumobilia, as before. High attenuation associated with the posterior margin of the gallbladder  appears to be external upon review of coronal and sagittal images. Mild biliary ductal dilatation, unchanged. Pancreas: Pancreatico duodenectomy. Spleen: Negative. Adrenals/Urinary Tract: Right adrenal gland is unremarkable. Heterogeneous left adrenal mass is chronically stable, measuring 2.3 x 2.6 cm. Right kidney is unremarkable. Low-attenuation lesions in the left kidney measure up to 12 mm, too small to characterize. Ureters are decompressed. Bladder is low in volume. Stomach/Bowel: Stomach is unremarkable. Pancreatico duodenectomy. Small bowel, appendix and colon are otherwise unremarkable. Vascular/Lymphatic: Atherosclerotic calcification of the aorta. Left periaortic lymph node has enlarged, now measuring 1.9 x 2.4 cm (2/62), compared to 9 x 13 mm on 01/11/2020. No additional pathologically enlarged lymph nodes. Reproductive: Hysterectomy.  No adnexal mass. Other: Cutaneous and subcutaneous nodules, including focal nodular thickening of the left gluteal fold (2/112), unchanged and consistent with a known diagnosis neurofibromatosis. No free fluid. Mesenteries and peritoneum are otherwise unremarkable. Musculoskeletal: No worrisome lytic or sclerotic lesions. Degenerative changes in the spine. IMPRESSION: 1. Interval progression of extensive stage small cell lung cancer as evidenced by enlarging mediastinal/left hilar adenopathy, progressive pleural nodularity in the left hemithorax and an enlarging periaortic lymph node in the abdominal retroperitoneum. 2. Moderate left pleural effusion and associated collapse/consolidation in the left lower lobe, similar. 3. Stable left adrenal nodule, characterized as an adenoma on prior exams. 4. Aortic atherosclerosis (ICD10-I70.0). Coronary artery calcification. 5.  Emphysema (ICD10-J43.9). Electronically Signed   By: Lorin Picket M.D.   On: 02/22/2020 09:47   MR Brain W Wo Contrast  Result Date: 02/17/2020 CLINICAL DATA:  Small cell lung cancer.  Staging. EXAM:  MRI HEAD WITHOUT AND WITH CONTRAST TECHNIQUE: Multiplanar, multiecho pulse sequences of the brain and surrounding structures were obtained without and with intravenous contrast. CONTRAST:  15mL GADAVIST GADOBUTROL 1 MMOL/ML IV SOLN COMPARISON:  11/27/2019 FINDINGS: Brain: Diffusion imaging does not show any acute or subacute infarction. Chronic small-vessel ischemic changes affect the pons and cerebral hemispheric white matter. 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 No large vessel territory infarction. No sign of intracranial metastatic disease. Small focus of enhancement in the right cerebellum question previously represents a small vessel. Small developmental venous anomalies in the right base of the brain and the left superior frontal gyrus are unchanged. No hydrocephalus, hemorrhage or extra-axial collection. Vascular: Major vessels at the base of the brain show flow. Skull and upper cervical spine: Negative Sinuses/Orbits: Clear/normal Other: None IMPRESSION: 1. No evidence of metastatic disease. 2. Chronic small-vessel ischemic changes as outlined above. 3. Vascular signal as questioned previously does not show any change or evidence that they represent metastatic lesions. Electronically Signed   By: Nelson Chimes M.D.   On: 02/17/2020 16:16   CT Abdomen Pelvis W Contrast  Result Date: 02/22/2020 CLINICAL DATA:  Small cell lung cancer, history of Whipple procedure. Chemotherapy in progress. EXAM: CT CHEST, ABDOMEN, AND PELVIS WITH CONTRAST TECHNIQUE: Multidetector CT imaging  of the chest, abdomen and pelvis was performed following the standard protocol during bolus administration of intravenous contrast. CONTRAST:  183mL OMNIPAQUE IOHEXOL 300 MG/ML  SOLN COMPARISON:  01/11/2020. FINDINGS: CT CHEST FINDINGS Cardiovascular: Right IJ Port-A-Cath terminates in the high right atrium. Atherosclerotic calcification of the aorta and coronary arteries. Heart size within normal limits. No pericardial  effusion. Mediastinum/Nodes: High right paratracheal lymph node has increased in size in the interval, now measuring 8 mm (2/18), previously 3 mm. Subcarinal nodal mass has also enlarged, now measuring 2.1 cm, previously 1.5 cm. Left hilar nodal mass has also enlarged slightly, now measuring 2.6 x 3.7 cm, compared to 2.3 x 3.5 cm on 01/11/2020. No right hilar or axillary adenopathy. Esophagus is grossly unremarkable. Periesophageal lymph node measures 8 mm (2/37), increased from 5 mm. Lungs/Pleura: Moderate left pleural effusion, similar, with increasing pleural nodularity. Index nodule in the posteromedial inferior left hemithorax measures 1.8 x 2.7 cm (2/56), compared to 1.6 x 2.3 cm on 01/11/2020. Associated collapse/consolidation in the left lower lobe, similar. Extensive bullous paraseptal and centrilobular emphysema. Biapical pleuroparenchymal scarring. Lungs are otherwise clear. There is narrowing and/or obstruction of left lower lobe bronchi due to left hilar adenopathy. Musculoskeletal: Degenerative changes in the spine. No worrisome lytic or sclerotic lesions. CT ABDOMEN PELVIS FINDINGS Hepatobiliary: Subcentimeter low-attenuation lesion in the right hepatic lobe is unchanged but too small to characterize. Pneumobilia, as before. High attenuation associated with the posterior margin of the gallbladder appears to be external upon review of coronal and sagittal images. Mild biliary ductal dilatation, unchanged. Pancreas: Pancreatico duodenectomy. Spleen: Negative. Adrenals/Urinary Tract: Right adrenal gland is unremarkable. Heterogeneous left adrenal mass is chronically stable, measuring 2.3 x 2.6 cm. Right kidney is unremarkable. Low-attenuation lesions in the left kidney measure up to 12 mm, too small to characterize. Ureters are decompressed. Bladder is low in volume. Stomach/Bowel: Stomach is unremarkable. Pancreatico duodenectomy. Small bowel, appendix and colon are otherwise unremarkable.  Vascular/Lymphatic: Atherosclerotic calcification of the aorta. Left periaortic lymph node has enlarged, now measuring 1.9 x 2.4 cm (2/62), compared to 9 x 13 mm on 01/11/2020. No additional pathologically enlarged lymph nodes. Reproductive: Hysterectomy.  No adnexal mass. Other: Cutaneous and subcutaneous nodules, including focal nodular thickening of the left gluteal fold (2/112), unchanged and consistent with a known diagnosis neurofibromatosis. No free fluid. Mesenteries and peritoneum are otherwise unremarkable. Musculoskeletal: No worrisome lytic or sclerotic lesions. Degenerative changes in the spine. IMPRESSION: 1. Interval progression of extensive stage small cell lung cancer as evidenced by enlarging mediastinal/left hilar adenopathy, progressive pleural nodularity in the left hemithorax and an enlarging periaortic lymph node in the abdominal retroperitoneum. 2. Moderate left pleural effusion and associated collapse/consolidation in the left lower lobe, similar. 3. Stable left adrenal nodule, characterized as an adenoma on prior exams. 4. Aortic atherosclerosis (ICD10-I70.0). Coronary artery calcification. 5.  Emphysema (ICD10-J43.9). Electronically Signed   By: Lorin Picket M.D.   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 Request is made for therapeutic left thoracentesis. EXAM: ULTRASOUND GUIDED THERAPEUTIC LEFT THORACENTESIS MEDICATIONS: 8 mL 1% lidocaine COMPLICATIONS: None immediate. PROCEDURE: An ultrasound guided thoracentesis was thoroughly discussed with the patient and questions answered. The benefits, risks, alternatives and complications were also discussed. The patient understands and wishes to proceed with the procedure. Written consent was obtained. Ultrasound was performed to  localize  and mark an adequate pocket of fluid in the left chest. The area was then prepped and draped in the normal sterile fashion. 1% Lidocaine was used for local anesthesia. Under ultrasound guidance a 6 Fr Safe-T-Centesis catheter was introduced. Thoracentesis was performed. The catheter was removed and a dressing applied. FINDINGS: A total of approximately 650 mL of hazy gold fluid was removed. IMPRESSION: Successful ultrasound guided left thoracentesis yielding 650 mL of pleural fluid. Read by: Earley Abide, PA-C Electronically Signed   By: Jacqulynn Cadet M.D.   On: 02/18/2020 12:39     ASSESSMENT/PLAN:  This is a very pleasant 64 year old Caucasian female diagnosed with extensive stage small cell lung cancer,the patient presented with a large left central left upper lobe lung mass invading the left hilum and mediastinum with extensive left-sided pleural metastatic disease and a malignant left pleural effusion. The patient had associated upper abdominal lymphadenopathy. She was diagnosed in August2021.  She previously underwent palliative systemic chemotherapy with carboplatin for an AUC of5on day one, etoposide 100 mg per metered squared on days 1,2, and4and Imfinzi 1500 mg IV on day one IV every 3 weeks. The patient is also on Coselafor myeloprotection. Unfortunately her scan showed evidence for disease progression while she is on chemotherapy which is concerning. This was discontinued due to evidence of disease progression.   She is currently undergoing treatment with second line treatment with Lurbinectedin 3.2 mg/M2 every 3 weeks. She is status post 1 cycle. She reports nausea/vomiting, decreased appetitie, and fatigue following cycle #1   The patient was seen with Dr. Julien Nordmann. Labs were reviewed. Her CMP is still pending at this time. Recommend she proceed with cycle #2 in 2 weeks as scheduled.   For the fatigue, decreased appetite, and nausea/vomiting, we sent a prescription  for decadron 4 mg tablets to take BID for 4-5 days following chemotherapy.   We will see her back for a follow up in 2 weeks for evaluation before starting cycle #2.   She will continue MS contin for pain control.   She was advised to use her supplemental oxygen to keep her oxygen >90%.   The patient was advised to call immediately if he has any concerning symptoms in the interval. The patient voices understanding of current disease status and treatment options and is in agreement with the current care plan. All questions were answered. The patient knows to call the clinic with any problems, questions or concerns. We can certainly see the patient much sooner if necessary  No orders of the defined types were placed in this encounter.    [name] Halliday L Falana Clagg, PA-C 03/09/20  ADDENDUM: Hematology/oncology Attending: I had a face-to-face encounter with the patient today.  I recommended her care plan.  This is a very pleasant 64 years old white female diagnosed with extensive stage small cell lung cancer status post 4 cycles of systemic chemotherapy with carboplatin, etoposide and Imfinzi.  She has initial partial response after cycle 2 but unfortunately she developed disease progression after cycle #4. The patient is switching to second line chemotherapy with Lurbinectedin status post 1 cycle started last week.  She has a rough time with the first week of her treatment with increasing fatigue and weakness as well as lack of appetite and nausea.  She is feeling much better over the last 2 days. I recommended for the patient to continue her treatment as planned and she is expected to start cycle #2 in 2 weeks.  For the delayed nausea as well  as fatigue and lack of appetite, we gave the patient prescription for Decadron 4 mg p.o. twice daily to be used 4-5 days after her chemotherapy. For pain management she will continue on MS Contin for her pain control. The patient will have weekly lab during  this. She was advised to call immediately if she has any concerning symptoms in the interval.  Disclaimer: This note was dictated with voice recognition software. Similar sounding words can inadvertently be transcribed and may be missed upon review. Eilleen Kempf, MD 03/09/20

## 2020-03-09 ENCOUNTER — Other Ambulatory Visit: Payer: PPO

## 2020-03-09 ENCOUNTER — Inpatient Hospital Stay: Payer: PPO | Admitting: Physician Assistant

## 2020-03-09 ENCOUNTER — Inpatient Hospital Stay: Payer: PPO

## 2020-03-09 ENCOUNTER — Other Ambulatory Visit: Payer: Self-pay

## 2020-03-09 ENCOUNTER — Encounter: Payer: Self-pay | Admitting: Physician Assistant

## 2020-03-09 ENCOUNTER — Ambulatory Visit: Payer: PPO | Admitting: Physician Assistant

## 2020-03-09 VITALS — BP 131/57 | HR 72 | Temp 97.7°F | Resp 18 | Ht 62.0 in | Wt 105.6 lb

## 2020-03-09 DIAGNOSIS — C3492 Malignant neoplasm of unspecified part of left bronchus or lung: Secondary | ICD-10-CM

## 2020-03-09 DIAGNOSIS — R630 Anorexia: Secondary | ICD-10-CM | POA: Insufficient documentation

## 2020-03-09 DIAGNOSIS — Z5111 Encounter for antineoplastic chemotherapy: Secondary | ICD-10-CM | POA: Diagnosis not present

## 2020-03-09 DIAGNOSIS — R112 Nausea with vomiting, unspecified: Secondary | ICD-10-CM | POA: Diagnosis not present

## 2020-03-09 DIAGNOSIS — Z95828 Presence of other vascular implants and grafts: Secondary | ICD-10-CM

## 2020-03-09 LAB — CBC WITH DIFFERENTIAL (CANCER CENTER ONLY)
Abs Immature Granulocytes: 0.07 10*3/uL (ref 0.00–0.07)
Basophils Absolute: 0.1 10*3/uL (ref 0.0–0.1)
Basophils Relative: 0 %
Eosinophils Absolute: 0.1 10*3/uL (ref 0.0–0.5)
Eosinophils Relative: 1 %
HCT: 29.4 % — ABNORMAL LOW (ref 36.0–46.0)
Hemoglobin: 9.5 g/dL — ABNORMAL LOW (ref 12.0–15.0)
Immature Granulocytes: 1 %
Lymphocytes Relative: 14 %
Lymphs Abs: 1.7 10*3/uL (ref 0.7–4.0)
MCH: 28.9 pg (ref 26.0–34.0)
MCHC: 32.3 g/dL (ref 30.0–36.0)
MCV: 89.4 fL (ref 80.0–100.0)
Monocytes Absolute: 0.6 10*3/uL (ref 0.1–1.0)
Monocytes Relative: 5 %
Neutro Abs: 9.3 10*3/uL — ABNORMAL HIGH (ref 1.7–7.7)
Neutrophils Relative %: 79 %
Platelet Count: 440 10*3/uL — ABNORMAL HIGH (ref 150–400)
RBC: 3.29 MIL/uL — ABNORMAL LOW (ref 3.87–5.11)
RDW: 17.2 % — ABNORMAL HIGH (ref 11.5–15.5)
WBC Count: 11.8 10*3/uL — ABNORMAL HIGH (ref 4.0–10.5)
nRBC: 0 % (ref 0.0–0.2)

## 2020-03-09 LAB — CMP (CANCER CENTER ONLY)
ALT: 33 U/L (ref 0–44)
AST: 24 U/L (ref 15–41)
Albumin: 2.8 g/dL — ABNORMAL LOW (ref 3.5–5.0)
Alkaline Phosphatase: 82 U/L (ref 38–126)
Anion gap: 8 (ref 5–15)
BUN: 13 mg/dL (ref 8–23)
CO2: 29 mmol/L (ref 22–32)
Calcium: 9.2 mg/dL (ref 8.9–10.3)
Chloride: 97 mmol/L — ABNORMAL LOW (ref 98–111)
Creatinine: 0.6 mg/dL (ref 0.44–1.00)
GFR, Estimated: >60 mL/min (ref >60)
Glucose, Bld: 97 mg/dL (ref 70–99)
Potassium: 4.7 mmol/L (ref 3.5–5.1)
Sodium: 134 mmol/L — ABNORMAL LOW (ref 135–145)
Total Bilirubin: <0.2 mg/dL — ABNORMAL LOW (ref 0.3–1.2)
Total Protein: 6.6 g/dL (ref 6.5–8.1)

## 2020-03-09 MED ORDER — SODIUM CHLORIDE 0.9% FLUSH
10.0000 mL | INTRAVENOUS | Status: AC | PRN
  Administered 2020-03-09: 10 mL
  Filled 2020-03-09: qty 10

## 2020-03-09 MED ORDER — DEXAMETHASONE 4 MG PO TABS: ORAL_TABLET | ORAL | 2 refills | Status: AC

## 2020-03-12 ENCOUNTER — Encounter: Payer: Self-pay | Admitting: Internal Medicine

## 2020-03-13 ENCOUNTER — Other Ambulatory Visit: Payer: Self-pay | Admitting: Internal Medicine

## 2020-03-13 DIAGNOSIS — Z5111 Encounter for antineoplastic chemotherapy: Secondary | ICD-10-CM

## 2020-03-14 ENCOUNTER — Other Ambulatory Visit: Payer: Self-pay | Admitting: Physician Assistant

## 2020-03-14 ENCOUNTER — Telehealth: Payer: Self-pay

## 2020-03-14 DIAGNOSIS — G893 Neoplasm related pain (acute) (chronic): Secondary | ICD-10-CM

## 2020-03-14 DIAGNOSIS — C3492 Malignant neoplasm of unspecified part of left bronchus or lung: Secondary | ICD-10-CM

## 2020-03-14 MED ORDER — HYDROCODONE-ACETAMINOPHEN 5-325 MG PO TABS: 1.0000 | ORAL_TABLET | Freq: Four times a day (QID) | ORAL | 0 refills | Status: DC | PRN

## 2020-03-14 NOTE — Telephone Encounter (Signed)
ERROR

## 2020-03-16 ENCOUNTER — Other Ambulatory Visit: Payer: PPO

## 2020-03-16 ENCOUNTER — Inpatient Hospital Stay: Payer: PPO

## 2020-03-16 ENCOUNTER — Other Ambulatory Visit: Payer: Self-pay

## 2020-03-16 DIAGNOSIS — C3492 Malignant neoplasm of unspecified part of left bronchus or lung: Secondary | ICD-10-CM

## 2020-03-16 DIAGNOSIS — Z5111 Encounter for antineoplastic chemotherapy: Secondary | ICD-10-CM | POA: Diagnosis not present

## 2020-03-16 LAB — CMP (CANCER CENTER ONLY)
ALT: 18 U/L (ref 0–44)
AST: 30 U/L (ref 15–41)
Albumin: 2.9 g/dL — ABNORMAL LOW (ref 3.5–5.0)
Alkaline Phosphatase: 89 U/L (ref 38–126)
Anion gap: 13 (ref 5–15)
BUN: 14 mg/dL (ref 8–23)
CO2: 24 mmol/L (ref 22–32)
Calcium: 9.1 mg/dL (ref 8.9–10.3)
Chloride: 100 mmol/L (ref 98–111)
Creatinine: 0.66 mg/dL (ref 0.44–1.00)
GFR, Estimated: >60 mL/min (ref >60)
Glucose, Bld: 117 mg/dL — ABNORMAL HIGH (ref 70–99)
Potassium: 4.5 mmol/L (ref 3.5–5.1)
Sodium: 137 mmol/L (ref 135–145)
Total Bilirubin: <0.2 mg/dL — ABNORMAL LOW (ref 0.3–1.2)
Total Protein: 6.7 g/dL (ref 6.5–8.1)

## 2020-03-16 LAB — CBC WITH DIFFERENTIAL (CANCER CENTER ONLY)
Abs Immature Granulocytes: 0.09 10*3/uL — ABNORMAL HIGH (ref 0.00–0.07)
Basophils Absolute: 0.1 10*3/uL (ref 0.0–0.1)
Basophils Relative: 1 %
Eosinophils Absolute: 0 10*3/uL (ref 0.0–0.5)
Eosinophils Relative: 0 %
HCT: 30.5 % — ABNORMAL LOW (ref 36.0–46.0)
Hemoglobin: 9.9 g/dL — ABNORMAL LOW (ref 12.0–15.0)
Immature Granulocytes: 1 %
Lymphocytes Relative: 21 %
Lymphs Abs: 1.9 10*3/uL (ref 0.7–4.0)
MCH: 28.9 pg (ref 26.0–34.0)
MCHC: 32.5 g/dL (ref 30.0–36.0)
MCV: 89.2 fL (ref 80.0–100.0)
Monocytes Absolute: 1.1 10*3/uL — ABNORMAL HIGH (ref 0.1–1.0)
Monocytes Relative: 12 %
Neutro Abs: 5.9 10*3/uL (ref 1.7–7.7)
Neutrophils Relative %: 65 %
Platelet Count: 652 10*3/uL — ABNORMAL HIGH (ref 150–400)
RBC: 3.42 MIL/uL — ABNORMAL LOW (ref 3.87–5.11)
RDW: 17.6 % — ABNORMAL HIGH (ref 11.5–15.5)
WBC Count: 9.2 10*3/uL (ref 4.0–10.5)
nRBC: 0 % (ref 0.0–0.2)

## 2020-03-16 LAB — TSH: TSH: 3.784 u[IU]/mL (ref 0.308–3.960)

## 2020-03-16 MED ORDER — SODIUM CHLORIDE 0.9% FLUSH
10.0000 mL | INTRAVENOUS | Status: DC | PRN
  Administered 2020-03-16: 10 mL
  Filled 2020-03-16: qty 10

## 2020-03-16 MED ORDER — ALTEPLASE 2 MG IJ SOLR
INTRAMUSCULAR | Status: AC
  Filled 2020-03-16: qty 2

## 2020-03-16 MED ORDER — ALTEPLASE 2 MG IJ SOLR
2.0000 mg | Freq: Once | INTRAMUSCULAR | Status: AC | PRN
  Administered 2020-03-16: 2 mg
  Filled 2020-03-16: qty 2

## 2020-03-16 MED ORDER — HEPARIN SOD (PORK) LOCK FLUSH 100 UNIT/ML IV SOLN
500.0000 [IU] | Freq: Once | INTRAVENOUS | Status: AC | PRN
  Administered 2020-03-16: 500 [IU]
  Filled 2020-03-16: qty 5

## 2020-03-16 NOTE — Progress Notes (Signed)
Patient dc'd in stable condition from hospital via wheelchair w/ daughter.

## 2020-03-16 NOTE — Patient Instructions (Signed)

## 2020-03-20 ENCOUNTER — Other Ambulatory Visit: Payer: Self-pay | Admitting: Internal Medicine

## 2020-03-20 DIAGNOSIS — Z5111 Encounter for antineoplastic chemotherapy: Secondary | ICD-10-CM

## 2020-03-23 ENCOUNTER — Encounter: Payer: Self-pay | Admitting: Internal Medicine

## 2020-03-23 ENCOUNTER — Inpatient Hospital Stay: Payer: PPO

## 2020-03-23 ENCOUNTER — Other Ambulatory Visit: Payer: Self-pay

## 2020-03-23 ENCOUNTER — Inpatient Hospital Stay: Payer: PPO | Attending: Physician Assistant | Admitting: Internal Medicine

## 2020-03-23 ENCOUNTER — Other Ambulatory Visit: Payer: Self-pay | Admitting: Internal Medicine

## 2020-03-23 ENCOUNTER — Other Ambulatory Visit: Payer: Self-pay | Admitting: Medical Oncology

## 2020-03-23 VITALS — BP 119/69 | HR 82 | Temp 97.8°F | Resp 18 | Ht 62.0 in | Wt 106.9 lb

## 2020-03-23 DIAGNOSIS — C3412 Malignant neoplasm of upper lobe, left bronchus or lung: Secondary | ICD-10-CM | POA: Diagnosis not present

## 2020-03-23 DIAGNOSIS — C3492 Malignant neoplasm of unspecified part of left bronchus or lung: Secondary | ICD-10-CM

## 2020-03-23 DIAGNOSIS — Z5111 Encounter for antineoplastic chemotherapy: Secondary | ICD-10-CM | POA: Insufficient documentation

## 2020-03-23 DIAGNOSIS — C3490 Malignant neoplasm of unspecified part of unspecified bronchus or lung: Secondary | ICD-10-CM | POA: Diagnosis not present

## 2020-03-23 DIAGNOSIS — G893 Neoplasm related pain (acute) (chronic): Secondary | ICD-10-CM

## 2020-03-23 DIAGNOSIS — Z79899 Other long term (current) drug therapy: Secondary | ICD-10-CM | POA: Insufficient documentation

## 2020-03-23 DIAGNOSIS — C782 Secondary malignant neoplasm of pleura: Secondary | ICD-10-CM | POA: Insufficient documentation

## 2020-03-23 LAB — CBC WITH DIFFERENTIAL (CANCER CENTER ONLY)
Abs Immature Granulocytes: 0.49 10*3/uL — ABNORMAL HIGH (ref 0.00–0.07)
Basophils Absolute: 0.1 10*3/uL (ref 0.0–0.1)
Basophils Relative: 0 %
Eosinophils Absolute: 0 10*3/uL (ref 0.0–0.5)
Eosinophils Relative: 0 %
HCT: 30.5 % — ABNORMAL LOW (ref 36.0–46.0)
Hemoglobin: 9.8 g/dL — ABNORMAL LOW (ref 12.0–15.0)
Immature Granulocytes: 3 %
Lymphocytes Relative: 15 %
Lymphs Abs: 2.4 10*3/uL (ref 0.7–4.0)
MCH: 28.9 pg (ref 26.0–34.0)
MCHC: 32.1 g/dL (ref 30.0–36.0)
MCV: 90 fL (ref 80.0–100.0)
Monocytes Absolute: 1.8 10*3/uL — ABNORMAL HIGH (ref 0.1–1.0)
Monocytes Relative: 11 %
Neutro Abs: 11.4 10*3/uL — ABNORMAL HIGH (ref 1.7–7.7)
Neutrophils Relative %: 71 %
Platelet Count: 644 10*3/uL — ABNORMAL HIGH (ref 150–400)
RBC: 3.39 MIL/uL — ABNORMAL LOW (ref 3.87–5.11)
RDW: 18.3 % — ABNORMAL HIGH (ref 11.5–15.5)
WBC Count: 16.2 10*3/uL — ABNORMAL HIGH (ref 4.0–10.5)
nRBC: 0 % (ref 0.0–0.2)

## 2020-03-23 LAB — CMP (CANCER CENTER ONLY)
ALT: 14 U/L (ref 0–44)
AST: 28 U/L (ref 15–41)
Albumin: 2.5 g/dL — ABNORMAL LOW (ref 3.5–5.0)
Alkaline Phosphatase: 104 U/L (ref 38–126)
Anion gap: 11 (ref 5–15)
BUN: 13 mg/dL (ref 8–23)
CO2: 24 mmol/L (ref 22–32)
Calcium: 9.1 mg/dL (ref 8.9–10.3)
Chloride: 98 mmol/L (ref 98–111)
Creatinine: 0.55 mg/dL (ref 0.44–1.00)
GFR, Estimated: >60 mL/min (ref >60)
Glucose, Bld: 101 mg/dL — ABNORMAL HIGH (ref 70–99)
Potassium: 4.3 mmol/L (ref 3.5–5.1)
Sodium: 133 mmol/L — ABNORMAL LOW (ref 135–145)
Total Bilirubin: <0.2 mg/dL — ABNORMAL LOW (ref 0.3–1.2)
Total Protein: 6.4 g/dL — ABNORMAL LOW (ref 6.5–8.1)

## 2020-03-23 MED ORDER — HYDROCODONE-ACETAMINOPHEN 5-325 MG PO TABS: 1.0000 | ORAL_TABLET | Freq: Four times a day (QID) | ORAL | 0 refills | Status: DC | PRN

## 2020-03-23 MED ORDER — PALONOSETRON HCL INJECTION 0.25 MG/5ML
INTRAVENOUS | Status: AC
  Filled 2020-03-23: qty 5

## 2020-03-23 MED ORDER — ACETAMINOPHEN 325 MG PO TABS
325.0000 mg | ORAL_TABLET | Freq: Once | ORAL | Status: AC
  Administered 2020-03-23: 325 mg via ORAL

## 2020-03-23 MED ORDER — SODIUM CHLORIDE 0.9 % IV SOLN
Freq: Once | INTRAVENOUS | Status: AC
  Filled 2020-03-23: qty 250

## 2020-03-23 MED ORDER — OXYCODONE HCL 5 MG PO TABS
5.0000 mg | ORAL_TABLET | Freq: Once | ORAL | Status: AC
  Administered 2020-03-23: 5 mg via ORAL

## 2020-03-23 MED ORDER — ACETAMINOPHEN 325 MG PO TABS
ORAL_TABLET | ORAL | Status: AC
  Filled 2020-03-23: qty 1

## 2020-03-23 MED ORDER — MORPHINE SULFATE ER 30 MG PO TBCR
30.0000 mg | EXTENDED_RELEASE_TABLET | Freq: Three times a day (TID) | ORAL | 0 refills | Status: AC
Start: 2020-03-23 — End: ?

## 2020-03-23 MED ORDER — PALONOSETRON HCL INJECTION 0.25 MG/5ML
0.2500 mg | Freq: Once | INTRAVENOUS | Status: AC
  Administered 2020-03-23: 0.25 mg via INTRAVENOUS

## 2020-03-23 MED ORDER — HEPARIN SOD (PORK) LOCK FLUSH 100 UNIT/ML IV SOLN
500.0000 [IU] | Freq: Once | INTRAVENOUS | Status: AC | PRN
  Administered 2020-03-23: 500 [IU]
  Filled 2020-03-23: qty 5

## 2020-03-23 MED ORDER — SODIUM CHLORIDE 0.9 % IV SOLN
10.0000 mg | Freq: Once | INTRAVENOUS | Status: AC
  Administered 2020-03-23: 10 mg via INTRAVENOUS
  Filled 2020-03-23: qty 10

## 2020-03-23 MED ORDER — OXYCODONE HCL 5 MG PO TABS
ORAL_TABLET | ORAL | Status: AC
  Filled 2020-03-23: qty 1

## 2020-03-23 MED ORDER — SODIUM CHLORIDE 0.9% FLUSH
10.0000 mL | INTRAVENOUS | Status: DC | PRN
  Administered 2020-03-23: 10 mL
  Filled 2020-03-23: qty 10

## 2020-03-23 MED ORDER — SODIUM CHLORIDE 0.9 % IV SOLN
3.2000 mg/m2 | Freq: Once | INTRAVENOUS | Status: AC
  Administered 2020-03-23: 4.55 mg via INTRAVENOUS
  Filled 2020-03-23: qty 9.1

## 2020-03-23 NOTE — Patient Instructions (Signed)
[facility] Discharge Instructions for Patients Receiving Chemotherapy  Today you received the following chemotherapy agents: Zepzelca.  To help prevent nausea and vomiting after your treatment, we encourage you to take your nausea medication as directed.   If you develop nausea and vomiting that is not controlled by your nausea medication, call the clinic.   BELOW ARE SYMPTOMS THAT SHOULD BE REPORTED IMMEDIATELY:  *FEVER GREATER THAN 100.5 F  *CHILLS WITH OR WITHOUT FEVER  NAUSEA AND VOMITING THAT IS NOT CONTROLLED WITH YOUR NAUSEA MEDICATION  *UNUSUAL SHORTNESS OF BREATH  *UNUSUAL BRUISING OR BLEEDING  TENDERNESS IN MOUTH AND THROAT WITH OR WITHOUT PRESENCE OF ULCERS  *URINARY PROBLEMS  *BOWEL PROBLEMS  UNUSUAL RASH Items with * indicate a potential emergency and should be followed up as soon as possible.  Feel free to call the clinic should you have any questions or concerns. The clinic phone number is [phone].  Please show the [facility] at check-in to the Emergency Department and triage nurse.

## 2020-03-23 NOTE — Progress Notes (Signed)
[facility] Telephone:([phone]   [phone]  OFFICE PROGRESS NOTE  [name], DO [address] [place] [address]  DIAGNOSIS: Extensive stage small cell lung cancer,the patient presented with a large left central left upper lobe lung mass invading the left hilum and mediastinum with extensive left-sided pleural metastatic disease and a malignant left pleural effusion. The patient had associated upper abdominal lymphadenopathy. She was diagnosed in August2021.  PRIOR THERAPY: Palliative systemic chemotherapy with carboplatin for an AUC of 5 on day 1, and etoposide 100 mg/m on days 1, 2, and 3 and Imfinzi 1500 mg IV every 3 weeks withCosela for myeloprotection. First dose expected on 12/02/2019.  Status post 4 cycles.  Last dose was giving February 06, 2020 discontinued secondary to disease progression.  CURRENT THERAPY:  Second line treatment with Lurbinectedin 3.2 mg/M2 every 3 weeks.  First dose March 02, 2020.  Status post 1 cycle.  INTERVAL HISTORY: [name] [name] 64 y.o. female returns to the clinic today for follow-up visit.  The patient is feeling weak and tired since her disease progression several weeks ago.  She also has intermittent nausea.  She has a lot of pain in her current pain medication with MS Contin as well as hydrocodone are not controlling her pain.  She also has insomnia and she was started on trazodone with minimal improvement.  She denied having any current shortness of breath except with exertion with no cough or hemoptysis.  She denied having any fever or chills.  She continued to have nausea but no significant vomiting, diarrhea or constipation.  She has no headache or visual changes.  She is here today for evaluation before starting cycle #2.     MEDICAL HISTORY: Past Medical History:  Diagnosis Date  . Allergy   . 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 Cancer ([place])   . Carcinoid tumor of abdomen     . Cataract    both eyes  . Family history of adverse reaction to anesthesia    Mother had trouble waking up  . Hypertension   . Lower back pain   . Neurofibromatosis, type I (von Recklinghausen's disease) ([facility])   . Pneumothorax on left   . Pneumothorax on right     ALLERGIES:  has No Known Allergies.  MEDICATIONS:  Current Outpatient Medications  Medication Sig Dispense Refill  . albuterol (VENTOLIN HFA) 108 (90 Base) MCG/ACT inhaler Inhale 2 puffs into the lungs every 6 (six) hours as needed for wheezing or shortness of breath. 18 g 6  . Cholecalciferol (D3) 50 MCG (2000 UT) TABS Take 2,000 Units by mouth daily with breakfast.     . dexamethasone (DECADRON) 4 MG tablet Take 1 tablet twice a day for 5 days after chemotherapy 40 tablet 2  . HYDROcodone-acetaminophen (NORCO/VICODIN) 5-325 MG tablet Take 1 tablet by mouth every 6 (six) hours as needed for moderate pain. 40 tablet 0  . metoprolol tartrate (LOPRESSOR) 25 MG tablet Take 0.5 tablets (12.5 mg total) by mouth 2 (two) times daily. 90 tablet 3  . morphine (MS CONTIN) 30 MG 12 hr tablet Take 1 tablet (30 mg total) by mouth every 12 (twelve) hours. 60 tablet 0  . Multiple Vitamins-Minerals (CENTRUM SILVER 50+WOMEN) TABS Take 1 tablet by mouth daily with breakfast.     . prochlorperazine (COMPAZINE) 10 MG tablet TAKE 1 TABLET BY MOUTH EVERY 6 HOURS AS NEEDED FOR  NAUSEA AND VOMITING 30 tablet 0  . traZODone (DESYREL) 50 MG tablet Take 1 tablet (50 mg total) by mouth at bedtime as needed for sleep. 30 tablet 3   No current facility-administered medications for this visit.    SURGICAL HISTORY:  Past Surgical History:  Procedure Laterality Date  . ABDOMINAL HYSTERECTOMY  04/23/1997   fibroids  TAH/ BSO  . CHOLECYSTECTOMY    . COLONOSCOPY  2006  . EYE SURGERY Left 11/2015   cataract and astigmatism surgery with Intraocular Lens left eye  . IR IMAGING GUIDED PORT INSERTION  12/07/2019  . LASIK    . LUNG SURGERY  2008?   d/t  pneumothroax  . MEMBRANE PEEL Left 08/16/2014   Procedure: MEMBRANE PEEL LEFT EYE ;  Surgeon: Sherlynn Stalls, MD;  Location: [facility];  Service: Ophthalmology;  Laterality: Left;  . OOPHORECTOMY    . PARS PLANA VITRECTOMY Left 08/16/2014   Procedure: PARS PLANA VITRECTOMY WITH 25 GAUGE;  Surgeon: Sherlynn Stalls, MD;  Location: [facility];  Service: Ophthalmology;  Laterality: Left;  . TONSILLECTOMY    . WHIPPLE PROCEDURE      REVIEW OF SYSTEMS:  Constitutional: positive for anorexia, fatigue and weight loss Eyes: negative Ears, nose, mouth, throat, and face: negative Respiratory: positive for dyspnea on exertion and pleurisy/chest pain Cardiovascular: negative Gastrointestinal: positive for nausea Genitourinary:negative Integument/breast: negative Hematologic/lymphatic: negative Musculoskeletal:positive for bone pain Neurological: negative Behavioral/Psych: negative Endocrine: negative Allergic/Immunologic: negative   PHYSICAL EXAMINATION: General appearance: alert, cooperative, fatigued and no distress Head: Normocephalic, without obvious abnormality, atraumatic Neck: no adenopathy, no JVD, supple, symmetrical, trachea midline and thyroid not enlarged, symmetric, no tenderness/mass/nodules Lymph nodes: Cervical, supraclavicular, and axillary nodes normal. Resp: clear to auscultation bilaterally Back: symmetric, no curvature. ROM normal. No CVA tenderness. Cardio: regular rate and rhythm, S1, S2 normal, no murmur, click, rub or gallop GI: soft, non-tender; bowel sounds normal; no masses,  no organomegaly Extremities: extremities normal, atraumatic, no cyanosis or edema Neurologic: Alert and oriented X 3, normal strength and tone. Normal symmetric reflexes. Normal coordination and gait  ECOG PERFORMANCE STATUS: 1 - Symptomatic but completely ambulatory  Blood pressure 119/69, pulse 82, temperature 97.8 F (36.6 C), temperature source Tympanic, resp. rate 18, height 5\' 2"  (1.575 m), weight  106 lb 14.4 oz (48.5 kg), SpO2 98 %.  LABORATORY DATA: Lab Results  Component Value Date   WBC 16.2 (H) 03/23/2020   HGB 9.8 (L) 03/23/2020   HCT 30.5 (L) 03/23/2020   MCV 90.0 03/23/2020   PLT 644 (H) 03/23/2020      Chemistry      Component Value Date/Time   NA 133 (L) 03/23/2020 1014   K 4.3 03/23/2020 1014   CL 98 03/23/2020 1014   CO2 24 03/23/2020 1014   BUN 13 03/23/2020 1014   CREATININE 0.55 03/23/2020 1014   CREATININE 0.63 07/11/2011 1016      Component Value Date/Time   CALCIUM 9.1 03/23/2020 1014   ALKPHOS 104 03/23/2020 1014   AST 28 03/23/2020 1014   ALT 14 03/23/2020 1014   BILITOT <0.2 (L) 03/23/2020 1014       RADIOGRAPHIC STUDIES: No results found.  ASSESSMENT AND PLAN: This is a very pleasant 64 years old white female recently diagnosed with extensive stage small cell lung cancer presented with large left central upper lobe lung mass invading the left hilum and mediastinum with extensive left-sided pleural metastatic disease as well as malignant left pleural effusion and upper abdominal lymphadenopathy diagnosed in August  2021. The patient is currently undergoing a course of systemic chemotherapy with carboplatin for AUC of 5 on day 1, etoposide 100 mg/M2 on days 1, 2 and 3 with Cosela on the days of chemotherapy.  She is status post 4 cycles.  The patient tolerated these 4 cycles fairly well except for fatigue and mild nausea. Unfortunately her scan after the induction treatment showed evidence for disease progression while she is on chemotherapy which is concerning. She is currently undergoing second line treatment with Lurbinectedin status post 1 cycle.  She continues to have increasing fatigue and weakness as well as intermittent nausea and pain. I had a lengthy discussion with the patient and her brother today about her current condition and treatment options.  I discussed with the patient stopping her treatment and considering hospice care at this  point but she would like to proceed with 1 more cycle of the treatment today as planned. I will arrange for her to have repeat CT scan of the chest before her upcoming visit for evaluation of her disease and then will make a decision at that time. For pain management I will increase her MS Contin to 30 mg p.o. every 8 hours and the patient will continue on Norco for breakthrough pain.  I gave her a refill of both medications. For the insomnia, she will continue with trazodone for now. The patient was advised to call immediately if she has any other concerning symptoms in the interval.  Disclaimer: This note was dictated with voice recognition software. Similar sounding words can inadvertently be transcribed and may not be corrected upon review.

## 2020-03-25 ENCOUNTER — Other Ambulatory Visit: Payer: Self-pay | Admitting: Internal Medicine

## 2020-03-27 ENCOUNTER — Other Ambulatory Visit: Payer: Self-pay | Admitting: Internal Medicine

## 2020-03-27 DIAGNOSIS — Z5111 Encounter for antineoplastic chemotherapy: Secondary | ICD-10-CM

## 2020-03-28 ENCOUNTER — Telehealth: Payer: Self-pay

## 2020-03-28 NOTE — Telephone Encounter (Signed)
Pt LM requesting Dr. Julien Nordmann prescribe Triazolam .25mg .  Discussed with Dr. Julien Nordmann and he advised he prescribes her pain medicatins and does not feel comfortable prescribing this medication.  I have spoken with the pt and advised as indicated and she expressed understanding of this information.

## 2020-03-30 ENCOUNTER — Other Ambulatory Visit: Payer: Self-pay

## 2020-03-30 ENCOUNTER — Other Ambulatory Visit: Payer: PPO

## 2020-03-30 ENCOUNTER — Inpatient Hospital Stay: Payer: PPO

## 2020-03-30 DIAGNOSIS — Z5111 Encounter for antineoplastic chemotherapy: Secondary | ICD-10-CM | POA: Diagnosis not present

## 2020-03-30 DIAGNOSIS — C3492 Malignant neoplasm of unspecified part of left bronchus or lung: Secondary | ICD-10-CM

## 2020-03-30 LAB — CBC WITH DIFFERENTIAL (CANCER CENTER ONLY)
Abs Immature Granulocytes: 0.4 10*3/uL — ABNORMAL HIGH (ref 0.00–0.07)
Basophils Absolute: 0 10*3/uL (ref 0.0–0.1)
Basophils Relative: 0 %
Eosinophils Absolute: 0 10*3/uL (ref 0.0–0.5)
Eosinophils Relative: 0 %
HCT: 27.9 % — ABNORMAL LOW (ref 36.0–46.0)
Hemoglobin: 9.3 g/dL — ABNORMAL LOW (ref 12.0–15.0)
Immature Granulocytes: 2 %
Lymphocytes Relative: 6 %
Lymphs Abs: 1.4 10*3/uL (ref 0.7–4.0)
MCH: 29.5 pg (ref 26.0–34.0)
MCHC: 33.3 g/dL (ref 30.0–36.0)
MCV: 88.6 fL (ref 80.0–100.0)
Monocytes Absolute: 0.5 10*3/uL (ref 0.1–1.0)
Monocytes Relative: 2 %
Neutro Abs: 21.8 10*3/uL — ABNORMAL HIGH (ref 1.7–7.7)
Neutrophils Relative %: 90 %
Platelet Count: 364 10*3/uL (ref 150–400)
RBC: 3.15 MIL/uL — ABNORMAL LOW (ref 3.87–5.11)
RDW: 17.7 % — ABNORMAL HIGH (ref 11.5–15.5)
WBC Count: 24.2 10*3/uL — ABNORMAL HIGH (ref 4.0–10.5)
nRBC: 0 % (ref 0.0–0.2)

## 2020-03-30 LAB — CMP (CANCER CENTER ONLY)
ALT: 23 U/L (ref 0–44)
AST: 24 U/L (ref 15–41)
Albumin: 2.5 g/dL — ABNORMAL LOW (ref 3.5–5.0)
Alkaline Phosphatase: 85 U/L (ref 38–126)
Anion gap: 6 (ref 5–15)
BUN: 18 mg/dL (ref 8–23)
CO2: 29 mmol/L (ref 22–32)
Calcium: 8.9 mg/dL (ref 8.9–10.3)
Chloride: 98 mmol/L (ref 98–111)
Creatinine: 0.59 mg/dL (ref 0.44–1.00)
GFR, Estimated: >60 mL/min (ref >60)
Glucose, Bld: 102 mg/dL — ABNORMAL HIGH (ref 70–99)
Potassium: 4.5 mmol/L (ref 3.5–5.1)
Sodium: 133 mmol/L — ABNORMAL LOW (ref 135–145)
Total Bilirubin: 0.3 mg/dL (ref 0.3–1.2)
Total Protein: 6 g/dL — ABNORMAL LOW (ref 6.5–8.1)

## 2020-04-01 DIAGNOSIS — J439 Emphysema, unspecified: Secondary | ICD-10-CM | POA: Diagnosis not present

## 2020-04-02 ENCOUNTER — Encounter: Payer: Self-pay | Admitting: Internal Medicine

## 2020-04-04 ENCOUNTER — Other Ambulatory Visit: Payer: Self-pay | Admitting: Internal Medicine

## 2020-04-04 ENCOUNTER — Other Ambulatory Visit: Payer: Self-pay | Admitting: Physician Assistant

## 2020-04-04 DIAGNOSIS — C3492 Malignant neoplasm of unspecified part of left bronchus or lung: Secondary | ICD-10-CM

## 2020-04-04 DIAGNOSIS — G893 Neoplasm related pain (acute) (chronic): Secondary | ICD-10-CM

## 2020-04-04 DIAGNOSIS — Z5111 Encounter for antineoplastic chemotherapy: Secondary | ICD-10-CM

## 2020-04-04 MED ORDER — HYDROCODONE-ACETAMINOPHEN 5-325 MG PO TABS: 1.0000 | ORAL_TABLET | Freq: Four times a day (QID) | ORAL | 0 refills | Status: DC | PRN

## 2020-04-04 MED ORDER — HYDROCODONE-ACETAMINOPHEN 5-325 MG PO TABS: 1.0000 | ORAL_TABLET | Freq: Four times a day (QID) | ORAL | 0 refills | Status: AC | PRN

## 2020-04-06 ENCOUNTER — Other Ambulatory Visit: Payer: PPO

## 2020-04-06 ENCOUNTER — Inpatient Hospital Stay: Payer: PPO

## 2020-04-06 ENCOUNTER — Other Ambulatory Visit: Payer: Self-pay

## 2020-04-06 DIAGNOSIS — C3492 Malignant neoplasm of unspecified part of left bronchus or lung: Secondary | ICD-10-CM

## 2020-04-06 DIAGNOSIS — Z5111 Encounter for antineoplastic chemotherapy: Secondary | ICD-10-CM | POA: Diagnosis not present

## 2020-04-06 DIAGNOSIS — Z95828 Presence of other vascular implants and grafts: Secondary | ICD-10-CM

## 2020-04-06 LAB — CBC WITH DIFFERENTIAL (CANCER CENTER ONLY)
Abs Immature Granulocytes: 0.16 10*3/uL — ABNORMAL HIGH (ref 0.00–0.07)
Basophils Absolute: 0 10*3/uL (ref 0.0–0.1)
Basophils Relative: 0 %
Eosinophils Absolute: 0.1 10*3/uL (ref 0.0–0.5)
Eosinophils Relative: 0 %
HCT: 28 % — ABNORMAL LOW (ref 36.0–46.0)
Hemoglobin: 9.1 g/dL — ABNORMAL LOW (ref 12.0–15.0)
Immature Granulocytes: 1 %
Lymphocytes Relative: 17 %
Lymphs Abs: 2 10*3/uL (ref 0.7–4.0)
MCH: 29.4 pg (ref 26.0–34.0)
MCHC: 32.5 g/dL (ref 30.0–36.0)
MCV: 90.3 fL (ref 80.0–100.0)
Monocytes Absolute: 1.8 10*3/uL — ABNORMAL HIGH (ref 0.1–1.0)
Monocytes Relative: 16 %
Neutro Abs: 7.4 10*3/uL (ref 1.7–7.7)
Neutrophils Relative %: 66 %
Platelet Count: 484 10*3/uL — ABNORMAL HIGH (ref 150–400)
RBC: 3.1 MIL/uL — ABNORMAL LOW (ref 3.87–5.11)
RDW: 18.2 % — ABNORMAL HIGH (ref 11.5–15.5)
WBC Count: 11.4 10*3/uL — ABNORMAL HIGH (ref 4.0–10.5)
nRBC: 0 % (ref 0.0–0.2)

## 2020-04-06 LAB — CMP (CANCER CENTER ONLY)
ALT: 23 U/L (ref 0–44)
AST: 32 U/L (ref 15–41)
Albumin: 2.4 g/dL — ABNORMAL LOW (ref 3.5–5.0)
Alkaline Phosphatase: 97 U/L (ref 38–126)
Anion gap: 10 (ref 5–15)
BUN: 19 mg/dL (ref 8–23)
CO2: 26 mmol/L (ref 22–32)
Calcium: 8.9 mg/dL (ref 8.9–10.3)
Chloride: 100 mmol/L (ref 98–111)
Creatinine: 0.62 mg/dL (ref 0.44–1.00)
GFR, Estimated: >60 mL/min (ref >60)
Glucose, Bld: 107 mg/dL — ABNORMAL HIGH (ref 70–99)
Potassium: 3.6 mmol/L (ref 3.5–5.1)
Sodium: 136 mmol/L (ref 135–145)
Total Bilirubin: 0.3 mg/dL (ref 0.3–1.2)
Total Protein: 6 g/dL — ABNORMAL LOW (ref 6.5–8.1)

## 2020-04-06 LAB — TSH: TSH: 3.044 u[IU]/mL (ref 0.308–3.960)

## 2020-04-06 MED ORDER — SODIUM CHLORIDE 0.9% FLUSH
10.0000 mL | Freq: Once | INTRAVENOUS | Status: AC
  Administered 2020-04-06: 09:00:00 10 mL
  Filled 2020-04-06: qty 10

## 2020-04-06 MED ORDER — HEPARIN SOD (PORK) LOCK FLUSH 100 UNIT/ML IV SOLN
500.0000 [IU] | Freq: Once | INTRAVENOUS | Status: AC
  Administered 2020-04-06: 09:00:00 500 [IU]
  Filled 2020-04-06: qty 5

## 2020-04-07 NOTE — Progress Notes (Deleted)
[facility] OFFICE PROGRESS NOTE  [name], DO [address] [place] [address]  DIAGNOSIS: Extensive stage small cell lung cancer,the patient presented with a large left central left upper lobe lung mass invading the left hilum and mediastinum with extensive left-sided pleural metastatic disease and a malignant left pleural effusion. The patient had associated upper abdominal lymphadenopathy. She was diagnosed in August2021.  PRIOR THERAPY: Palliative systemic chemotherapy with carboplatin for an AUC of 5 on day 1, and etoposide 100 mg/m on days 1, 2, and 3 and Imfinzi 1500 mg IV every 3 weeks withCosela for myeloprotection. First dose expected on 12/02/2019.Status post 4 cycles.  Last dose was giving February 06, 2020 discontinued secondary to disease progression.  CURRENT THERAPY: Second line treatment with Lurbinectedin 3.2 mg/M2 every 3 weeks.  First dose March 02, 2020.  Status post 2 cycles.  INTERVAL HISTORY: [name] [name] 64 y.o. female returns to clinic today for follow-up visit accompanied by her _.  The patient is feeling _today without any concerning complaints except for _.  At the patient's last appointment, she was endorsing weakness and fatigue since she started showing evidence of disease progression a few weeks ago.  Dr. Julien Nordmann had discussed comfort care/hospice with her at her last appointment but she opted to continue with 1 more cycle of treatment.  Since her last appointment, the patient continues to report _.  She is taking MS Contin 30 mg twice daily for pain and Norco every _hours for breakthrough pain.  She denies any fever, chills, or night sweats.  Weight loss?  She reports shortness of breath with exertion for which she is on supplemental oxygen with _liters of oxygen.  She denies any cough, hemoptysis, chest pain.  She denies any diarrhea or constipation.  She reports some nausea without significant vomiting.  She  denies any headache or visual changes.  The patient recently had a restaging CT scan performed.  The patient is here today for evaluation and to review her scan results before starting cycle #3.    MEDICAL HISTORY: Past Medical History:  Diagnosis Date  . Allergy   . 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 Cancer ([facility])   . Carcinoid tumor of abdomen   . Cataract    both eyes  . Family history of adverse reaction to anesthesia    Mother had trouble waking up  . Hypertension   . Lower back pain   . Neurofibromatosis, type I (von Recklinghausen's disease) ([facility])   . Pneumothorax on left   . Pneumothorax on right     ALLERGIES:  has No Known Allergies.  MEDICATIONS:  Current Outpatient Medications  Medication Sig Dispense Refill  . albuterol (VENTOLIN HFA) 108 (90 Base) MCG/ACT inhaler Inhale 2 puffs into the lungs every 6 (six) hours as needed for wheezing or shortness of breath. 18 g 6  . Cholecalciferol (D3) 50 MCG (2000 UT) TABS Take 2,000 Units by mouth daily with breakfast.     . dexamethasone (DECADRON) 4 MG tablet Take 1 tablet twice a day for 5 days after chemotherapy 40 tablet 2  . HYDROcodone-acetaminophen (NORCO/VICODIN) 5-325 MG tablet Take 1 tablet by mouth every 6 (six) hours as needed for moderate pain. 60 tablet 0  . metoprolol tartrate (LOPRESSOR) 25 MG tablet Take 0.5 tablets (12.5 mg total) by mouth 2 (two) times daily. 90 tablet 3  . morphine (MS CONTIN) 30 MG 12 hr tablet  Take 1 tablet (30 mg total) by mouth 3 (three) times daily. 90 tablet 0  . Multiple Vitamins-Minerals (CENTRUM SILVER 50+WOMEN) TABS Take 1 tablet by mouth daily with breakfast.     . prochlorperazine (COMPAZINE) 10 MG tablet TAKE 1 TABLET BY MOUTH EVERY 6 HOURS AS NEEDED FOR NAUSEA AND VOMITING 30 tablet 0  . traZODone (DESYREL) 50 MG tablet Take 1 tablet (50 mg total) by mouth at bedtime as needed for sleep. 30 tablet 3   No current facility-administered medications for this visit.    SURGICAL  HISTORY:  Past Surgical History:  Procedure Laterality Date  . ABDOMINAL HYSTERECTOMY  04/23/1997   fibroids  TAH/ BSO  . CHOLECYSTECTOMY    . COLONOSCOPY  2006  . EYE SURGERY Left 11/2015   cataract and astigmatism surgery with Intraocular Lens left eye  . IR IMAGING GUIDED PORT INSERTION  12/07/2019  . LASIK    . LUNG SURGERY  2008?   d/t pneumothroax  . MEMBRANE PEEL Left 08/16/2014   Procedure: MEMBRANE PEEL LEFT EYE ;  Surgeon: Sherlynn Stalls, MD;  Location: [facility];  Service: Ophthalmology;  Laterality: Left;  . OOPHORECTOMY    . PARS PLANA VITRECTOMY Left 08/16/2014   Procedure: PARS PLANA VITRECTOMY WITH 25 GAUGE;  Surgeon: Sherlynn Stalls, MD;  Location: [facility];  Service: Ophthalmology;  Laterality: Left;  . TONSILLECTOMY    . WHIPPLE PROCEDURE      REVIEW OF SYSTEMS:   Review of Systems  Constitutional: Negative for appetite change, chills, fatigue, fever and unexpected weight change.  HENT:   Negative for mouth sores, nosebleeds, sore throat and trouble swallowing.   Eyes: Negative for eye problems and icterus.  Respiratory: Negative for cough, hemoptysis, shortness of breath and wheezing.   Cardiovascular: Negative for chest pain and leg swelling.  Gastrointestinal: Negative for abdominal pain, constipation, diarrhea, nausea and vomiting.  Genitourinary: Negative for bladder incontinence, difficulty urinating, dysuria, frequency and hematuria.   Musculoskeletal: Negative for back pain, gait problem, neck pain and neck stiffness.  Skin: Negative for itching and rash.  Neurological: Negative for dizziness, extremity weakness, gait problem, headaches, light-headedness and seizures.  Hematological: Negative for adenopathy. Does not bruise/bleed easily.  Psychiatric/Behavioral: Negative for confusion, depression and sleep disturbance. The patient is not nervous/anxious.     PHYSICAL EXAMINATION:  There were no vitals taken for this visit.  ECOG PERFORMANCE STATUS: {CHL ONC ECOG  [id number]  Physical Exam  Constitutional: Oriented to person, place, and time and well-developed, well-nourished, and in no distress. No distress.  HENT:  Head: Normocephalic and atraumatic.  Mouth/Throat: Oropharynx is clear and moist. No oropharyngeal exudate.  Eyes: Conjunctivae are normal. Right eye exhibits no discharge. Left eye exhibits no discharge. No scleral icterus.  Neck: Normal range of motion. Neck supple.  Cardiovascular: Normal rate, regular rhythm, normal heart sounds and intact distal pulses.   Pulmonary/Chest: Effort normal and breath sounds normal. No respiratory distress. No wheezes. No rales.  Abdominal: Soft. Bowel sounds are normal. Exhibits no distension and no mass. There is no tenderness.  Musculoskeletal: Normal range of motion. Exhibits no edema.  Lymphadenopathy:    No cervical adenopathy.  Neurological: Alert and oriented to person, place, and time. Exhibits normal muscle tone. Gait normal. Coordination normal.  Skin: Skin is warm and dry. No rash noted. Not diaphoretic. No erythema. No pallor.  Psychiatric: Mood, memory and judgment normal.  Vitals reviewed.  LABORATORY DATA: Lab Results  Component Value Date   WBC 11.4 (H)  04/06/2020   HGB 9.1 (L) 04/06/2020   HCT 28.0 (L) 04/06/2020   MCV 90.3 04/06/2020   PLT 484 (H) 04/06/2020      Chemistry      Component Value Date/Time   NA 136 04/06/2020 0844   K 3.6 04/06/2020 0844   CL 100 04/06/2020 0844   CO2 26 04/06/2020 0844   BUN 19 04/06/2020 0844   CREATININE 0.62 04/06/2020 0844   CREATININE 0.63 07/11/2011 1016      Component Value Date/Time   CALCIUM 8.9 04/06/2020 0844   ALKPHOS 97 04/06/2020 0844   AST 32 04/06/2020 0844   ALT 23 04/06/2020 0844   BILITOT 0.3 04/06/2020 0844       RADIOGRAPHIC STUDIES:  No results found.   ASSESSMENT/PLAN:  This is a very pleasant 64 year old Caucasian female diagnosed with extensive stage small cell lung cancer,the patient presented  with a large left central left upper lobe lung mass invading the left hilum and mediastinum with extensive left-sided pleural metastatic disease and a malignant left pleural effusion. The patient had associated upper abdominal lymphadenopathy. She was diagnosed in August2021.  She previously underwent palliative systemic chemotherapy with carboplatin for an AUC of5on day one, etoposide 100 mg per metered squared on days 1,2, and4and Imfinzi 1500 mg IV on day one IV every 3 weeks. The patient is also on Coselafor myeloprotection. Unfortunately her scan showed evidence for disease progression while she is on chemotherapy which is concerning. This was discontinued due to evidence of disease progression.   She is currently undergoing treatment withsecond line treatment with Lurbinectedin 3.2 mg/M2 every 3 weeks. She is status post 2 cycles.   The patient has been reporting fatigue, weakness, decreased appetite, and nausea since she started showing evidence of disease progression a few weeks ago as well as increasing pain.  The patient recently had a restaging CT scan performed.  Dr. Earlie Server personally and independently reviewed the scan discussed results with the patient today.  The scan showed _.  Dr. Earlie Server recommends _.   I will arrange for _  MS Contin and Norco for pain control  The patient was advised to call immediately if she has any concerning symptoms in the interval. The patient voices understanding of current disease status and treatment options and is in agreement with the current care plan. All questions were answered. The patient knows to call the clinic with any problems, questions or concerns. We can certainly see the patient much sooner if necessary          No orders of the defined types were placed in this encounter.    Ginia Rudell L [place] Valli, PA-C 04/07/20

## 2020-04-12 ENCOUNTER — Other Ambulatory Visit: Payer: Self-pay

## 2020-04-12 ENCOUNTER — Ambulatory Visit (HOSPITAL_COMMUNITY)
Admission: RE | Admit: 2020-04-12 | Discharge: 2020-04-12 | Disposition: A | Discharge status: Home / Self Care | Payer: PPO | Source: Ambulatory Visit | Attending: Internal Medicine | Admitting: Internal Medicine

## 2020-04-12 ENCOUNTER — Encounter (HOSPITAL_COMMUNITY): Payer: Self-pay

## 2020-04-12 DIAGNOSIS — K5730 Diverticulosis of large intestine without perforation or abscess without bleeding: Secondary | ICD-10-CM | POA: Diagnosis not present

## 2020-04-12 DIAGNOSIS — C3490 Malignant neoplasm of unspecified part of unspecified bronchus or lung: Secondary | ICD-10-CM | POA: Diagnosis not present

## 2020-04-12 DIAGNOSIS — Q453 Other congenital malformations of pancreas and pancreatic duct: Secondary | ICD-10-CM | POA: Diagnosis not present

## 2020-04-12 DIAGNOSIS — R4020 Unspecified coma: Secondary | ICD-10-CM | POA: Diagnosis not present

## 2020-04-12 DIAGNOSIS — R404 Transient alteration of awareness: Secondary | ICD-10-CM | POA: Diagnosis not present

## 2020-04-12 DIAGNOSIS — I499 Cardiac arrhythmia, unspecified: Secondary | ICD-10-CM | POA: Diagnosis not present

## 2020-04-12 DIAGNOSIS — R0689 Other abnormalities of breathing: Secondary | ICD-10-CM | POA: Diagnosis not present

## 2020-04-12 DIAGNOSIS — J439 Emphysema, unspecified: Secondary | ICD-10-CM | POA: Diagnosis not present

## 2020-04-12 DIAGNOSIS — J9809 Other diseases of bronchus, not elsewhere classified: Secondary | ICD-10-CM | POA: Diagnosis not present

## 2020-04-12 DIAGNOSIS — I700 Atherosclerosis of aorta: Secondary | ICD-10-CM | POA: Diagnosis not present

## 2020-04-12 DIAGNOSIS — J90 Pleural effusion, not elsewhere classified: Secondary | ICD-10-CM | POA: Diagnosis not present

## 2020-04-12 DIAGNOSIS — E278 Other specified disorders of adrenal gland: Secondary | ICD-10-CM | POA: Diagnosis not present

## 2020-04-12 MED ORDER — IOHEXOL 300 MG/ML  SOLN
100.0000 mL | Freq: Once | INTRAMUSCULAR | Status: AC | PRN
  Administered 2020-04-12: 09:00:00 80 mL via INTRAVENOUS

## 2020-04-12 MED ORDER — HEPARIN SOD (PORK) LOCK FLUSH 100 UNIT/ML IV SOLN
INTRAVENOUS | Status: AC
  Filled 2020-04-12: qty 5

## 2020-04-12 MED ORDER — HEPARIN SOD (PORK) LOCK FLUSH 100 UNIT/ML IV SOLN
500.0000 [IU] | Freq: Once | INTRAVENOUS | Status: AC
  Administered 2020-04-12: 09:00:00 500 [IU] via INTRAVENOUS

## 2020-04-12 MED FILL — Dexamethasone Sodium Phosphate Inj 100 MG/10ML: INTRAMUSCULAR | Qty: 1 | Status: AC

## 2020-04-13 ENCOUNTER — Inpatient Hospital Stay: Payer: PPO

## 2020-04-13 ENCOUNTER — Inpatient Hospital Stay: Payer: PPO | Admitting: Physician Assistant

## 2020-04-23 DIAGNOSIS — 419620001 Death: Secondary | SNOMED CT | POA: Diagnosis not present

## 2020-04-23 DEATH — deceased

## 2020-04-26 ENCOUNTER — Telehealth: Payer: Self-pay | Admitting: Family Medicine

## 2020-04-26 NOTE — Telephone Encounter (Signed)
Caller [name] [name] Call Back # [phone]  [name] [name] with [facility] is calling in reference to death certificate completion.    please advise

## 2020-04-26 NOTE — Telephone Encounter (Signed)
I can do it on Thursday if they are ok with that

## 2020-04-26 NOTE — Telephone Encounter (Signed)
Received death certificate. Would you like another provider to sign? Please advise

## 2020-04-27 NOTE — Telephone Encounter (Signed)
Provider was unaware pt passed on 13/08 and cert was brought 65/78. Pts family would like to proceed with arrangements. Dr Larose Kells- are you agreeable to signing cert? Can be done online but a paper copy has been brought to the office and [facility] has it.

## 2020-04-27 NOTE — Telephone Encounter (Signed)
LMOM to daughter Elmyra Ricks.

## 2020-04-27 NOTE — Telephone Encounter (Signed)
Caller Eboni Coval brother  [phone]  Patient brother call back to speak to Dr. Larose Kells.

## 2020-04-27 NOTE — Telephone Encounter (Signed)
I am willing to sign however I need few more details: Did she died at home?  Was she in  hospice?

## 2020-04-27 NOTE — Telephone Encounter (Signed)
According to the tutorial --- once they print it you can not go back to online

## 2020-04-27 NOTE — Telephone Encounter (Signed)
Spoke w/ [facility]- informed death certificate ready for pick up. Copy sent for scanning.

## 2020-04-27 NOTE — Telephone Encounter (Signed)
Spoke with the patient's brother, A day prior to her death she has some visual disturbances. The day of her that she was simply at home, returning from the restroom with the help of her family and shortly after she died. No known fever chills, no chest pain. I will sign the death certificate as a multiorgan failure due to lung cancer, emphysema, tobacco.

## 2021-08-12 IMAGING — DX DG THORACIC SPINE 3V
3 series · 3 of 3 positions shown · non-contrast
Comparison: 03/06/2019

CLINICAL DATA: Left back pain for 2 weeks

EXAM:
LUMBAR SPINE - COMPLETE 4+ VIEW; THORACIC SPINE - 3 VIEWS

[t-spine ap]
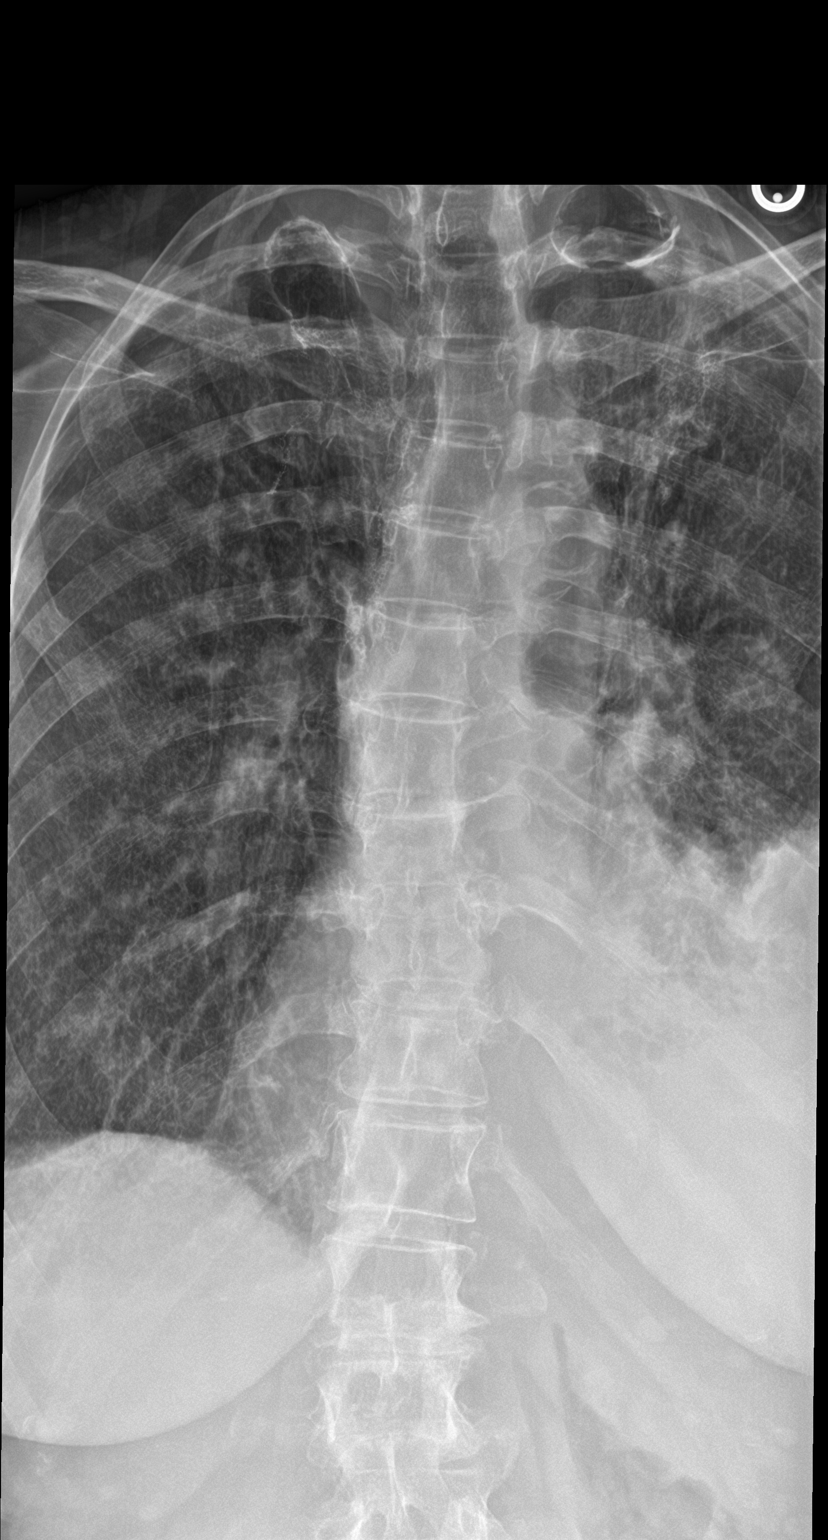

[t-spine lat]
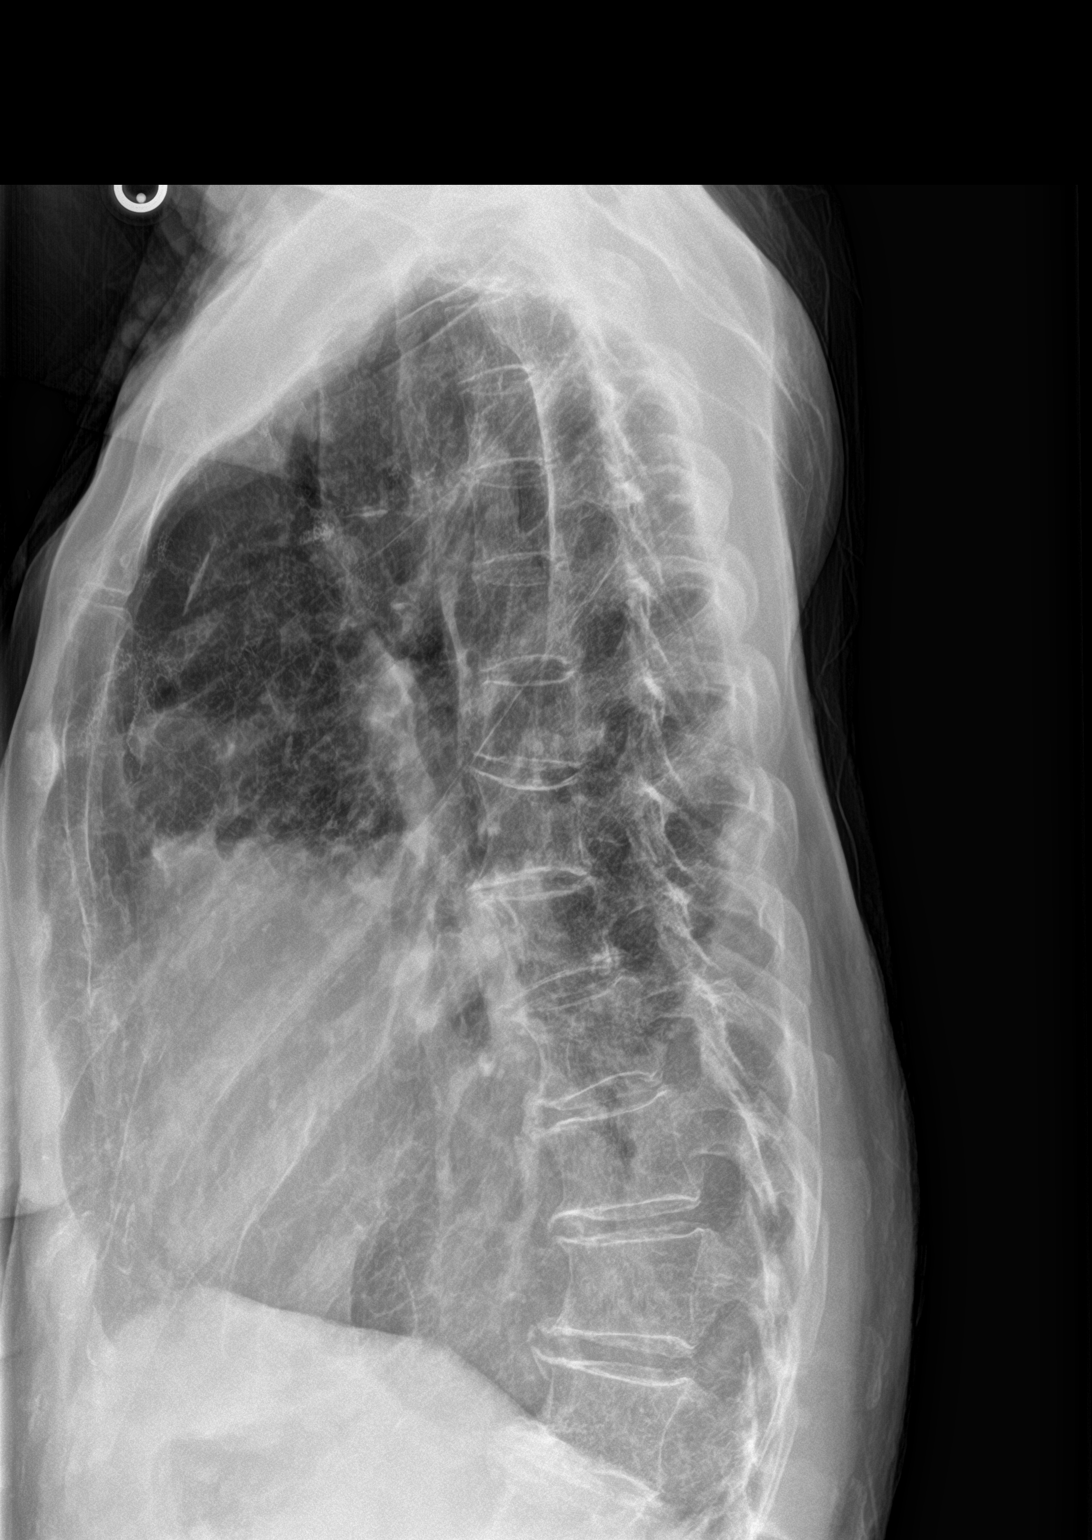

[t-spine swimmers]
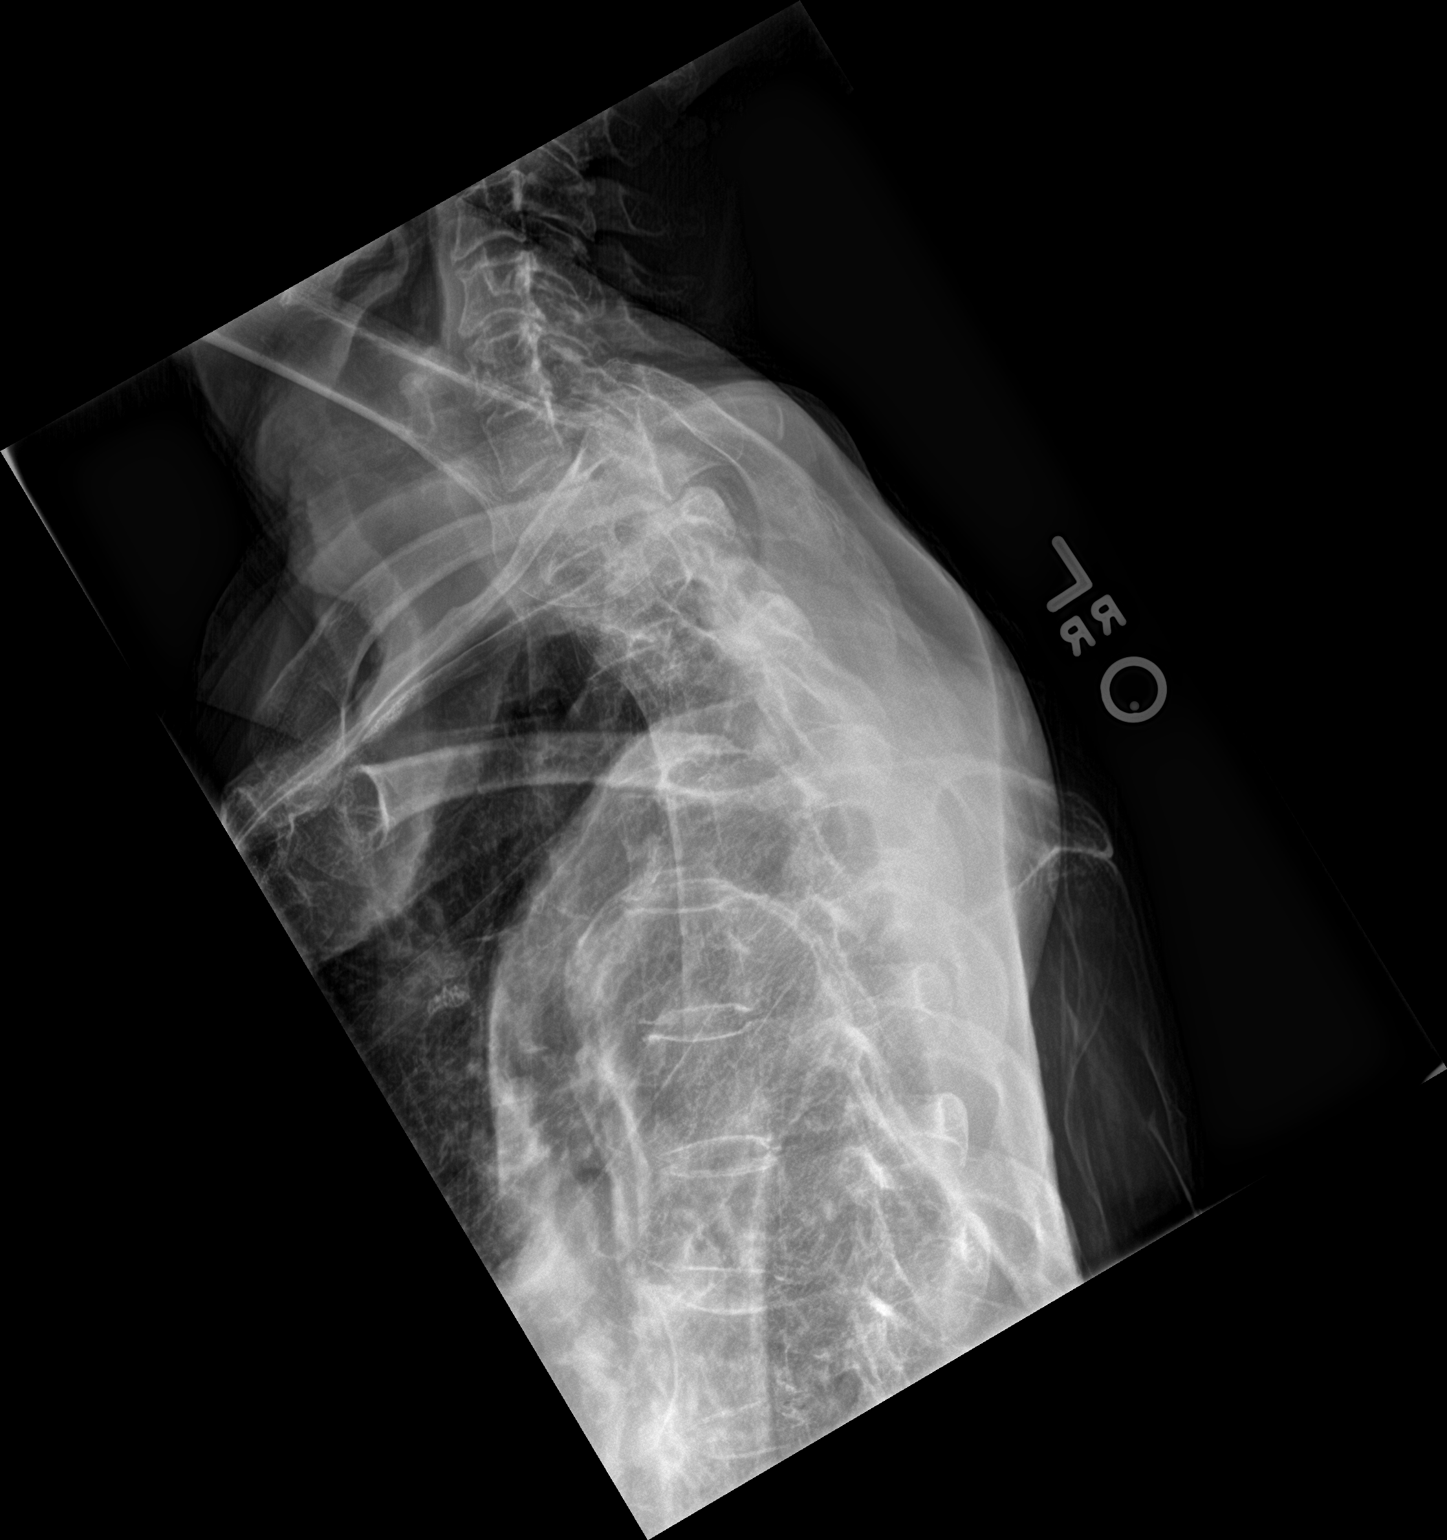

[3 of 3 positions shown; findings below may reference images not displayed]

FINDINGS: Thoracic spine: Frontal and lateral views of the thoracic spine
demonstrate rotatory scoliosis of the thoracolumbar spine, with
reversal of normal lordosis at the thoracolumbar junction. No acute
or destructive bony lesions. There is mild lower thoracic
spondylosis. Incidental note is made of dense left basilar
consolidation and possible left effusion, dedicated chest imaging
with two view chest x-ray or chest CT is recommended. There is
extensive background emphysema and scarring.

Lumbar spine: Frontal, bilateral oblique, lateral views of the
lumbar spine demonstrate mild rotatory scoliosis, right convex at
the thoracolumbar junction. There is extensive multilevel
spondylosis greatest at the L1/L2 and L2/L3 levels. No acute
fractures. Sacroiliac joints are normal.
IMPRESSION: 1. Dense consolidation and possible effusion at the left lung base,
superimposed upon severe background emphysema. Dedicated chest
imaging with either two-view chest x-ray or chest CT is recommended.
2. Rotatory scoliosis of the thoracolumbar spine.
3. Prominent spondylosis at the thoracolumbar junction.
4. No acute bony abnormality.

These results will be called to the ordering clinician or
representative by the Radiologist Assistant, and communication
documented in the PACS or [REDACTED].

## 2021-10-12 IMAGING — CT CT CHEST W/ CM
2 of 5 series · 12 of 36 positions shown, 15 images · IV contrast (omnipaque)
Comparison: CT 11/24/2019 and 11/12/2019.  PET-CT 11/17/2019.

CLINICAL DATA: Restaging small cell lung cancer diagnosed 3 months
ago. Chemotherapy in progress.

EXAM:
CT CHEST, ABDOMEN, AND PELVIS WITH CONTRAST
TECHNIQUE: Multidetector CT imaging of the chest, abdomen and pelvis was
performed following the standard protocol during bolus
administration of intravenous contrast.
CONTRAST:  75mL OMNIPAQUE IOHEXOL 300 MG/ML  SOLN

[Series 2: cap with · axial · 0.65mm/px · z∈[-581,-66]mm · 9 of 127 slices shown, 12 images]
[im 12/127  mediastinal]
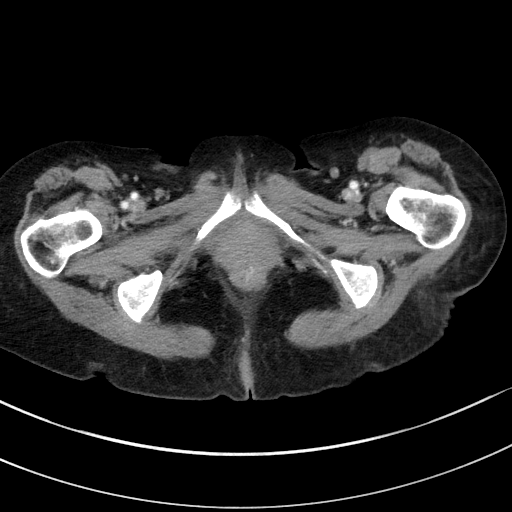
[im 12/127  lung]
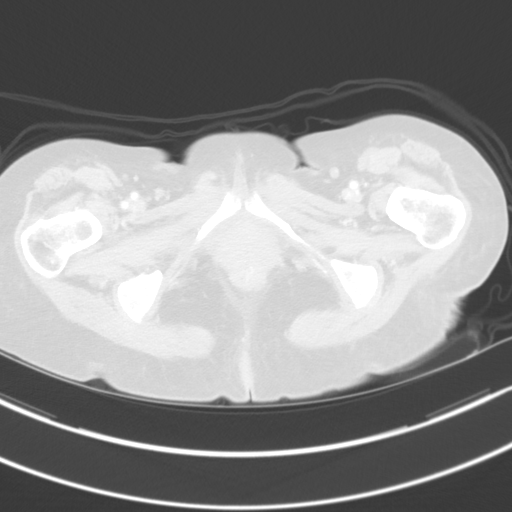
[im 23/127  lung]
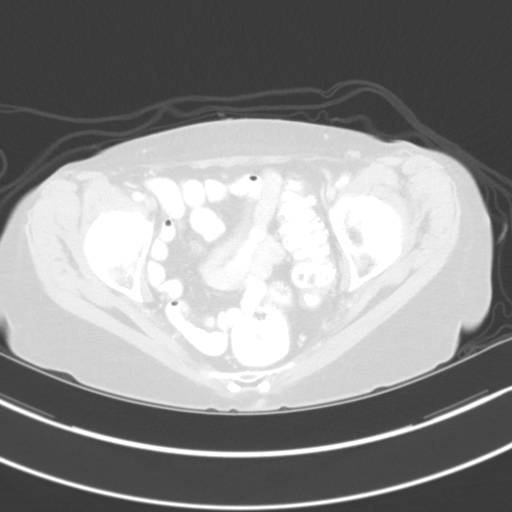
[im 35/127  lung]
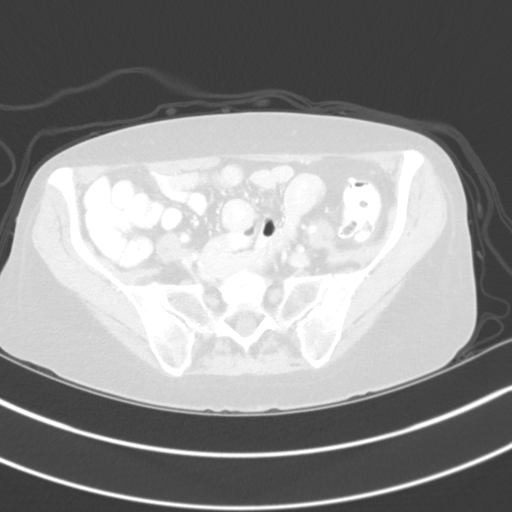
[im 46/127  lung]
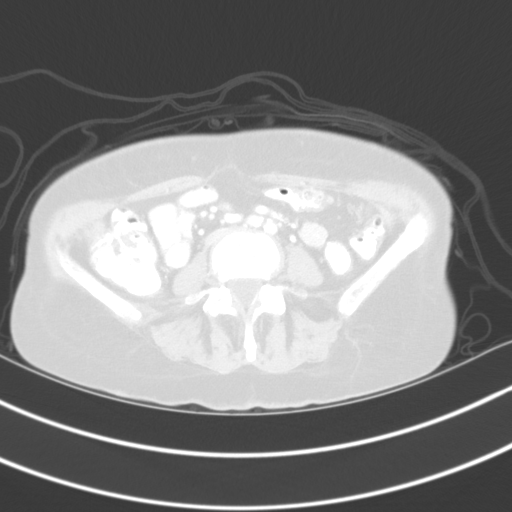
[im 69/127  mediastinal]
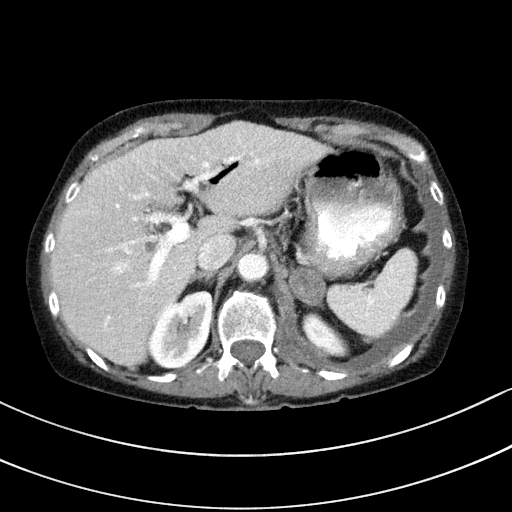
[im 69/127  lung]
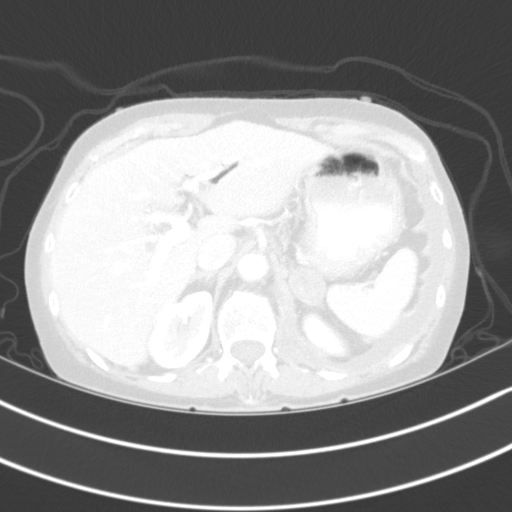
[im 81/127  lung]
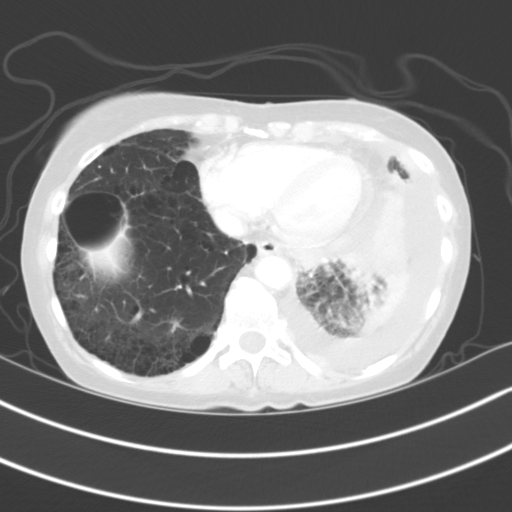
[im 92/127  lung]
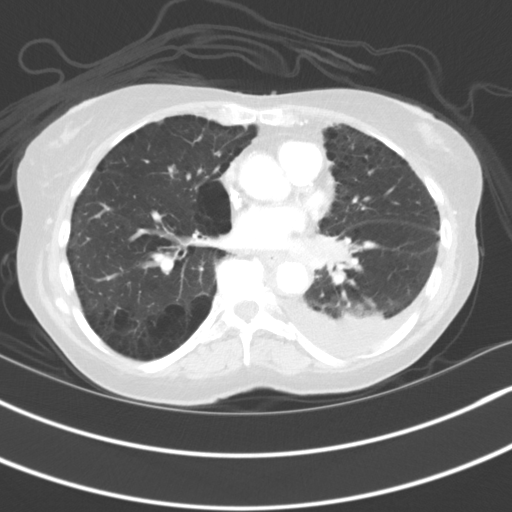
[im 104/127  lung]
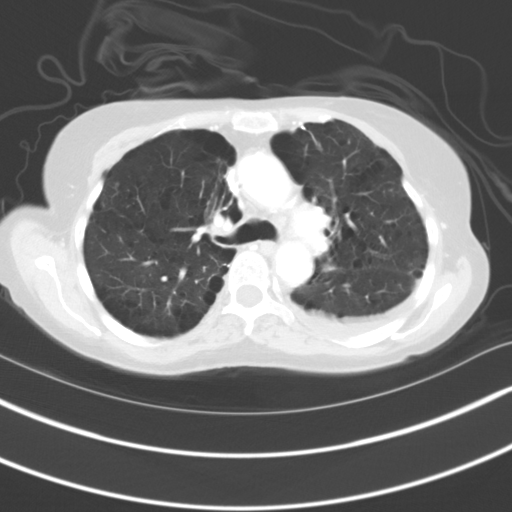
[im 115/127  mediastinal]
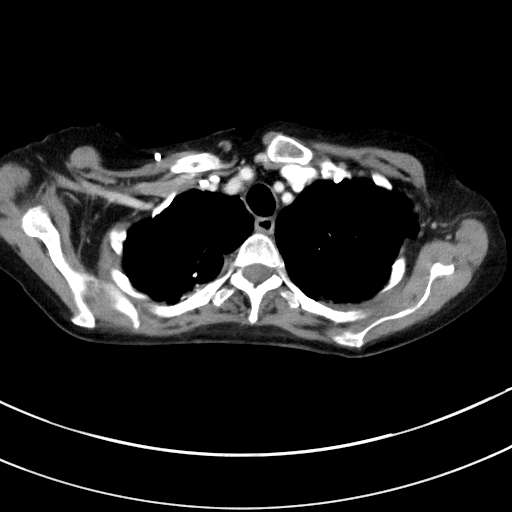
[im 115/127  lung]
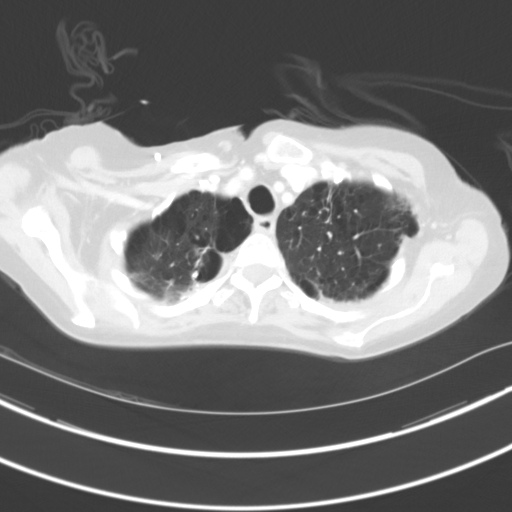

[Series 5: coronals · coronal · 0.80mm/px · 3 of 129 slices shown]
[im 26/129  lung]
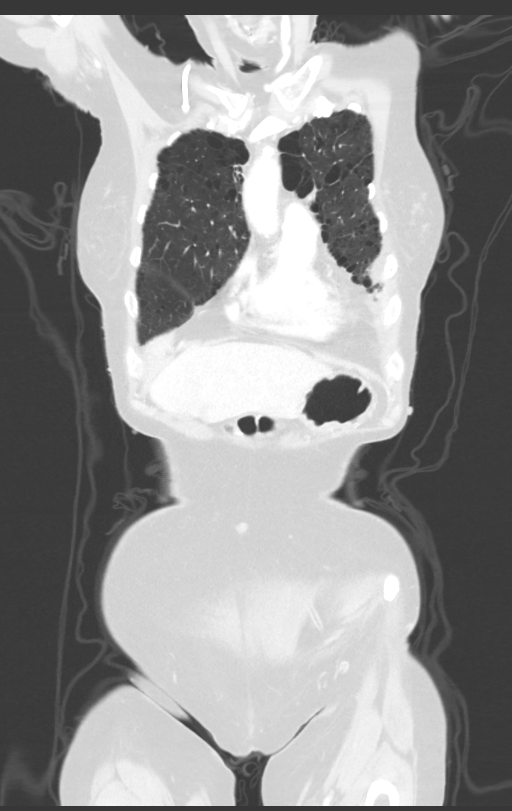
[im 52/129  lung]
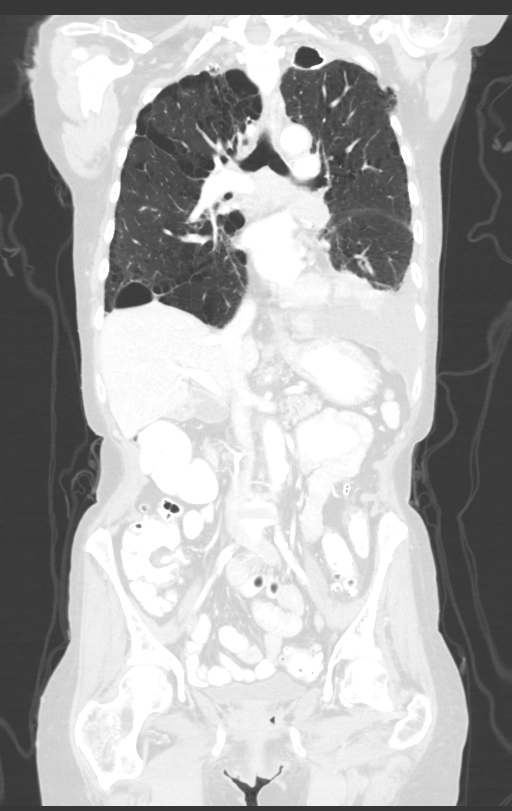
[im 77/129  lung]
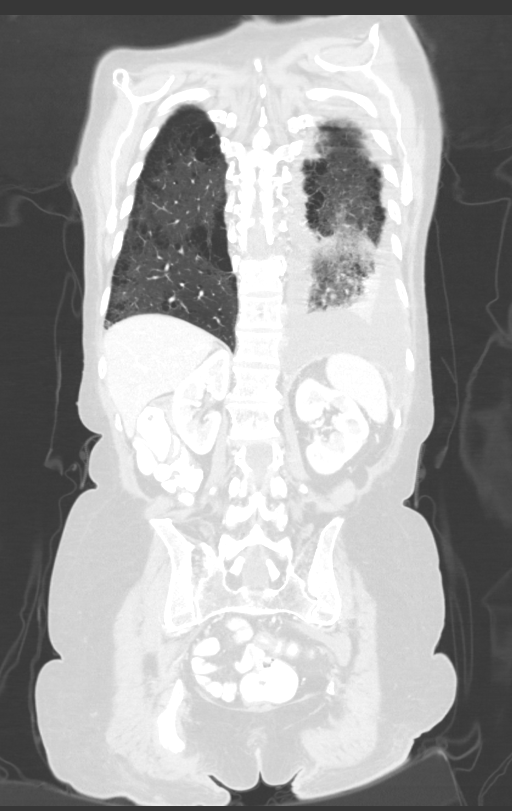

[12 of 36 positions shown; findings below may reference images not displayed]

FINDINGS: CT CHEST FINDINGS

Cardiovascular: Atherosclerosis of the aorta, great vessels and
coronary arteries. Right IJ Port-A-Cath extends to the level of the
superior right atrium. No acute vascular findings. The heart size is
normal. There is no pericardial effusion.

Mediastinum/Nodes: Interval improvement in mediastinal and left
hilar adenopathy. Residual subcarinal nodal mass as a short axis
dimension of 1.5 cm on image [DATE]. Left infrahilar mass measures
x 2.3 cm on image 33/2. No axillary or progressive mediastinal
adenopathy. The thyroid gland, trachea and esophagus demonstrate no
significant findings.

Lungs/Pleura: The left pleural effusion has decreased in volume.
Multiple left-sided pleural based masses are again noted consistent
with metastatic disease. These have improved compared with the prior
studies. The largest residual pleural based masses include a 1.7 x
1.4 cm nodule lateral to the gastroesophageal junction (image 51/2),
a 1.7 x 1.4 cm nodule posteriorly on image 54/2 and a 2.3 x 1.6 cm
nodule posteromedially on image 57/2. No right-sided pleural
effusion or pleural nodularity. Severe centrilobular and paraseptal
emphysema again noted with diffuse central airway thickening and
partially calcified biapical bulla. The left infrahilar mass has
decreased in size and remains contiguous with left hilar and AP
window nodal adenopathy. There is improved aeration of the left
lung. No new or enlarging pulmonary nodules.

Musculoskeletal/Chest wall: No chest wall mass or suspicious osseous
findings.

CT ABDOMEN AND PELVIS FINDINGS

Hepatobiliary: The liver is normal in density without suspicious
focal abnormality. Stable probable small cystic lesion centrally in
the right hepatic lobe on image 59/2. Previous cholecystectomy and
pneumobilia attributed to reported previous Whipple procedure. No
biliary dilatation.

Pancreas: Stable postsurgical changes in the pancreatic head
consistent with previous Whipple procedure. The pancreatic body and
tail appears stable without ductal dilatation or surrounding
inflammation.

Spleen: Normal in size without focal abnormality.

Adrenals/Urinary Tract: Stable 2.7 x 1.8 cm left adrenal nodule on
image 59/2 from 6452, consistent with an incidental adenoma. No
right adrenal nodule. Several small left renal cysts. No evidence of
enhancing renal mass, urinary tract calculus or hydronephrosis. The
bladder appears unremarkable.

Stomach/Bowel: Postsurgical changes in the stomach and proximal
small bowel attributed to previous Whipple procedure, grossly
stable. No bowel wall thickening, distention or surrounding
inflammation. The appendix appears normal. There are mild
diverticular changes throughout the distal colon without evidence of
acute inflammation.

Vascular/Lymphatic: Interval improvement in previously demonstrated
hypermetabolic upper retroperitoneal lymphadenopathy. There is
remaining small node posterior to the left renal artery, measuring
1.3 x 0.9 cm on image 64/2 (previously 1.9 x 1.4 cm). No progressive
adenopathy. Aortic and branch vessel atherosclerosis without acute
vascular findings. The portal, superior mesenteric and splenic veins
are patent.

Reproductive: Hysterectomy.  No adnexal mass.

Other: Intact abdominal wall. No ascites or peritoneal nodularity.
Numerous cutaneous and subcutaneous nodules are present consistent
with neurofibromatosis.

Musculoskeletal: No acute or significant osseous findings. Stable
degenerative changes in the spine.
IMPRESSION: 1. Interval response to therapy with decreased size of left
infrahilar mass, improved left pleural metastases and improved
mediastinal and upper abdominal adenopathy. No progressive
metastatic disease identified.
2. Decreased left pleural effusion with improved aeration of the
left lung.
3. Stable postsurgical changes status post Whipple procedure.
4. Stable left adrenal adenoma.
5. Numerous cutaneous and subcutaneous nodules consistent with
neurofibromatosis.
6. Aortic Atherosclerosis (84TEG-EJH.H) and Emphysema (84TEG-VEN.Y).

## 2021-10-12 IMAGING — CT CT ABD-PELV W/ CM
2 of 5 series · 12 of 36 positions shown, 15 images · IV contrast (OMNIPAQUE)
Comparison: CT 11/24/2019 and 11/12/2019.  PET-CT 11/17/2019.

CLINICAL DATA: Restaging small cell lung cancer diagnosed 3 months
ago. Chemotherapy in progress.

EXAM:
CT CHEST, ABDOMEN, AND PELVIS WITH CONTRAST
TECHNIQUE: Multidetector CT imaging of the chest, abdomen and pelvis was
performed following the standard protocol during bolus
administration of intravenous contrast.
CONTRAST:  75mL OMNIPAQUE IOHEXOL 300 MG/ML  SOLN

[Series 2: cap with · axial · 0.65mm/px · z∈[-581,-66]mm · 9 of 127 slices shown, 12 images]
[im 12/127  mediastinal]
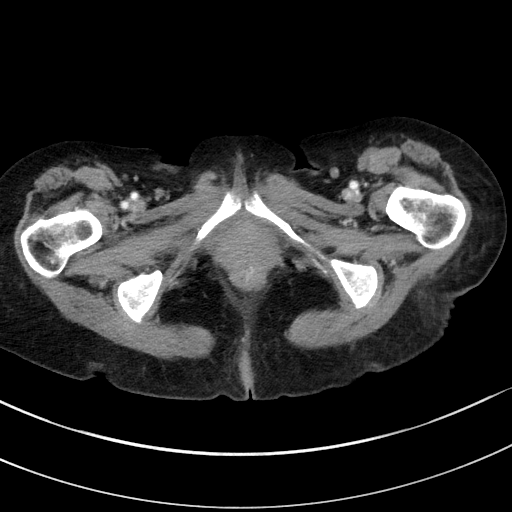
[im 12/127  lung]
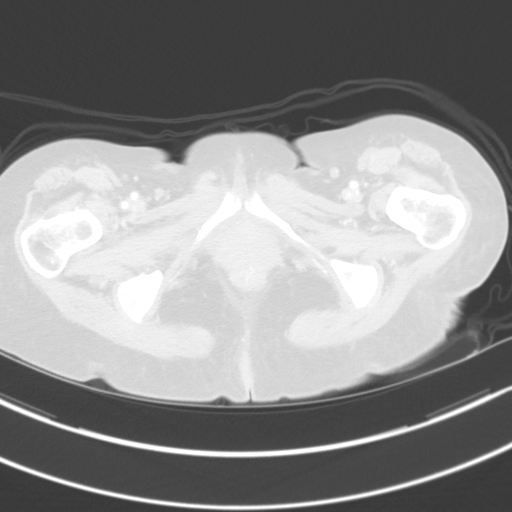
[im 23/127  lung]
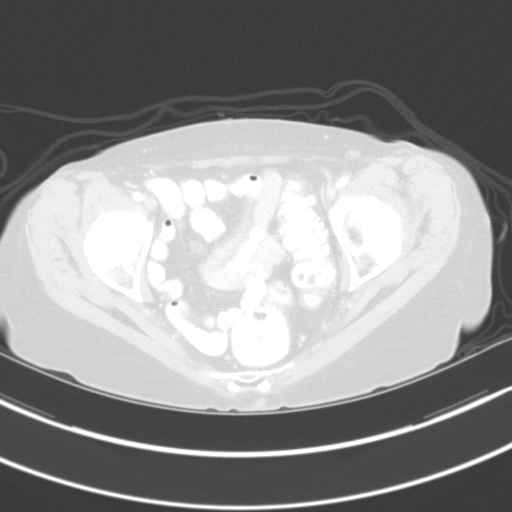
[im 35/127  lung]
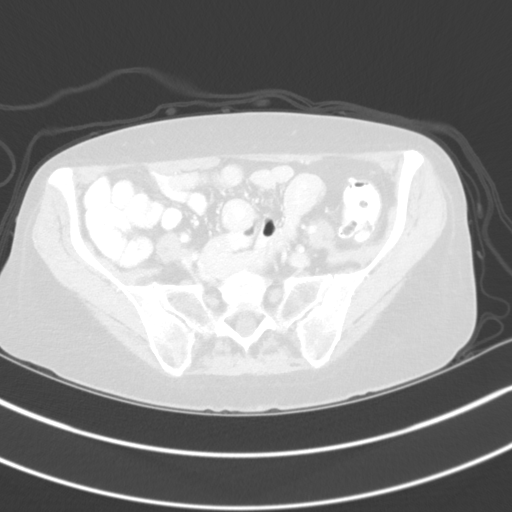
[im 46/127  lung]
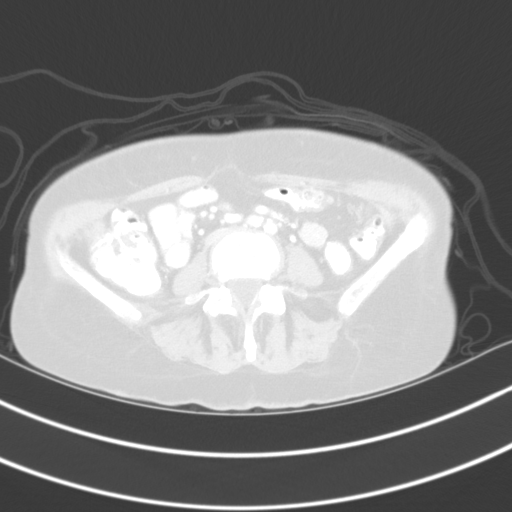
[im 69/127  mediastinal]
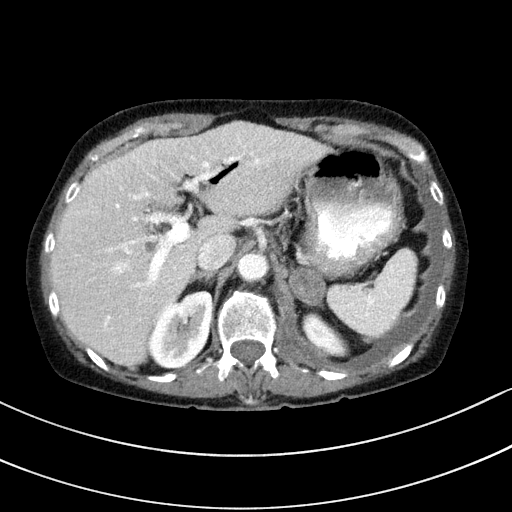
[im 69/127  lung]
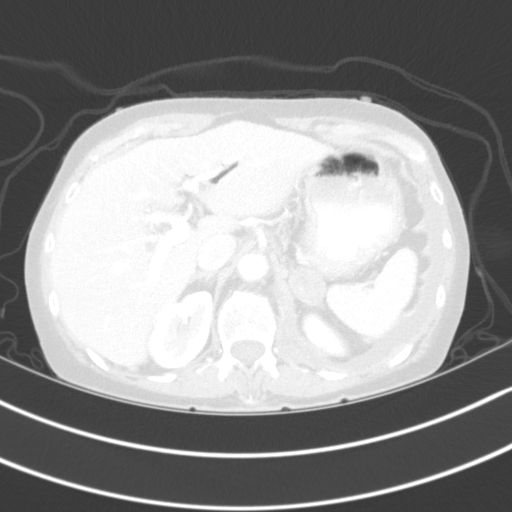
[im 81/127  lung]
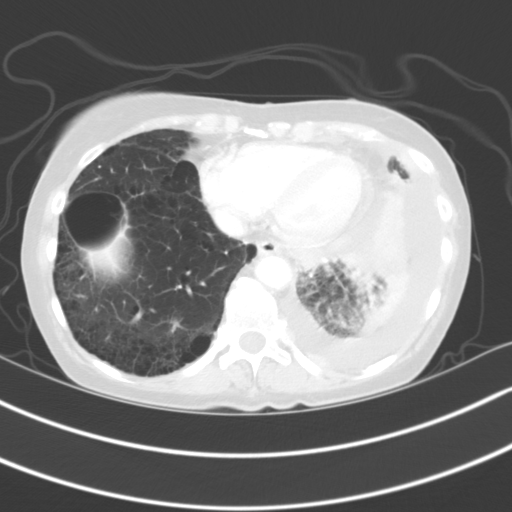
[im 92/127  lung]
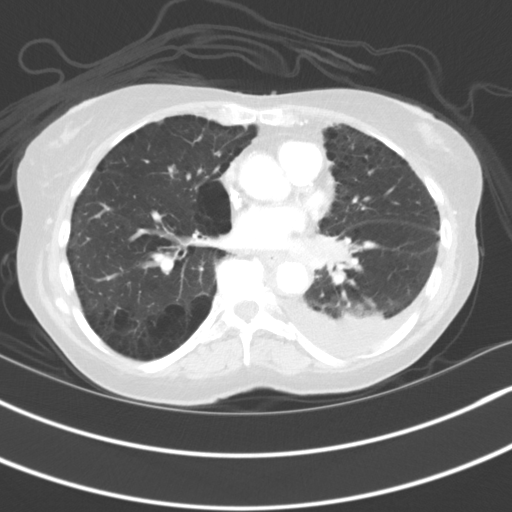
[im 104/127  lung]
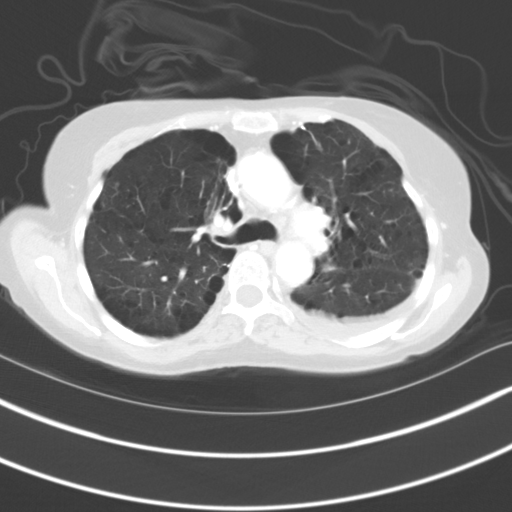
[im 115/127  mediastinal]
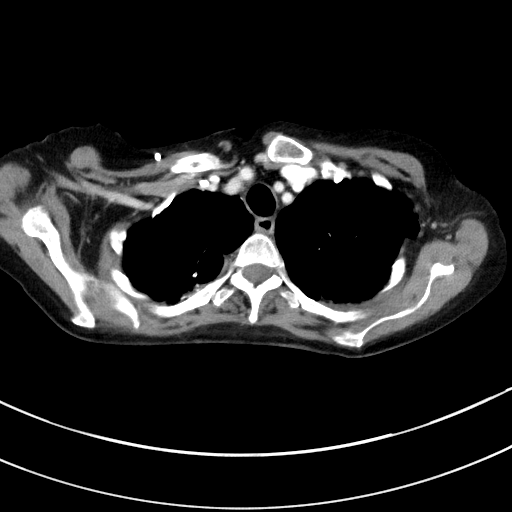
[im 115/127  lung]
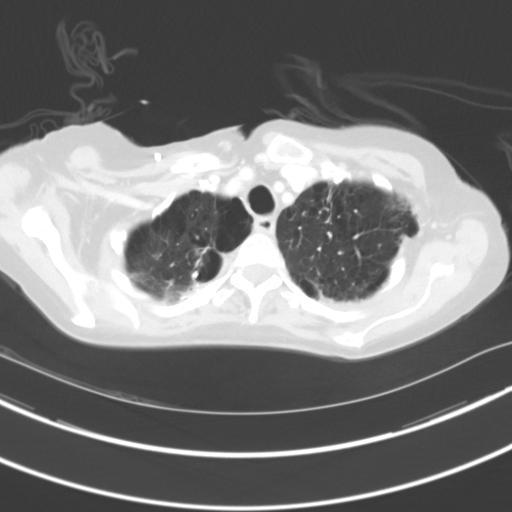

[Series 5: coronals · coronal · 0.80mm/px · 3 of 129 slices shown]
[im 26/129  lung]
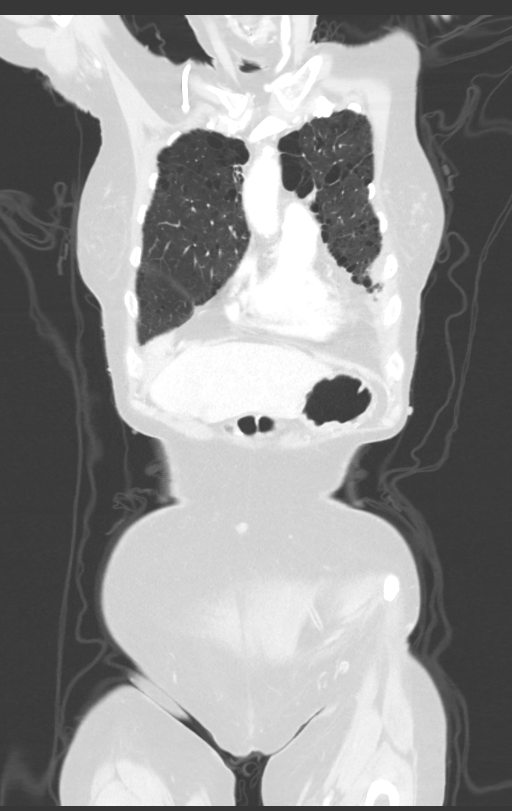
[im 52/129  lung]
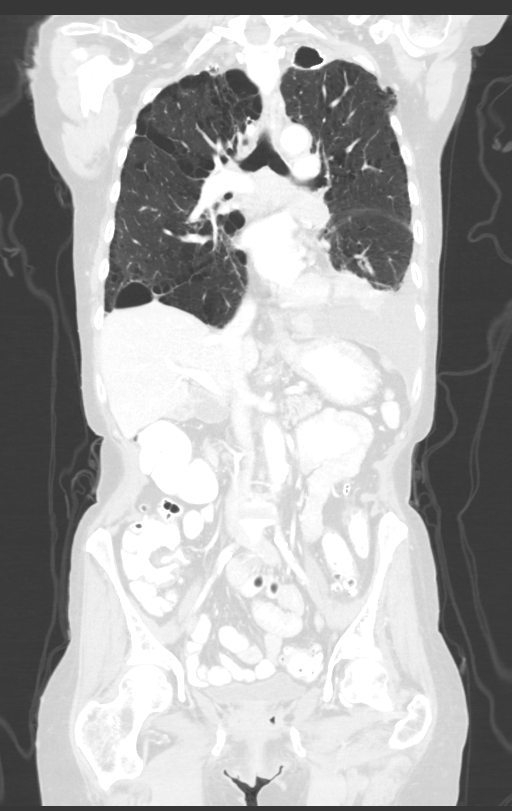
[im 77/129  lung]
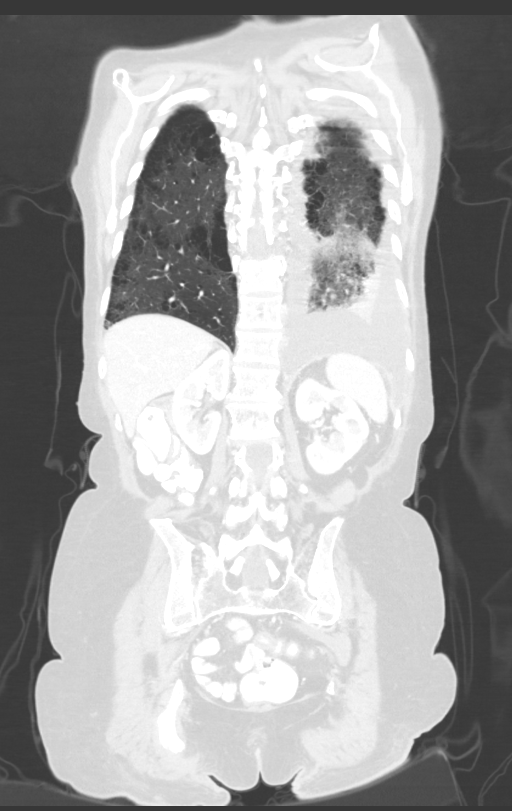

[12 of 36 positions shown; findings below may reference images not displayed]

FINDINGS: CT CHEST FINDINGS

Cardiovascular: Atherosclerosis of the aorta, great vessels and
coronary arteries. Right IJ Port-A-Cath extends to the level of the
superior right atrium. No acute vascular findings. The heart size is
normal. There is no pericardial effusion.

Mediastinum/Nodes: Interval improvement in mediastinal and left
hilar adenopathy. Residual subcarinal nodal mass as a short axis
dimension of 1.5 cm on image [DATE]. Left infrahilar mass measures
x 2.3 cm on image 33/2. No axillary or progressive mediastinal
adenopathy. The thyroid gland, trachea and esophagus demonstrate no
significant findings.

Lungs/Pleura: The left pleural effusion has decreased in volume.
Multiple left-sided pleural based masses are again noted consistent
with metastatic disease. These have improved compared with the prior
studies. The largest residual pleural based masses include a 1.7 x
1.4 cm nodule lateral to the gastroesophageal junction (image 51/2),
a 1.7 x 1.4 cm nodule posteriorly on image 54/2 and a 2.3 x 1.6 cm
nodule posteromedially on image 57/2. No right-sided pleural
effusion or pleural nodularity. Severe centrilobular and paraseptal
emphysema again noted with diffuse central airway thickening and
partially calcified biapical bulla. The left infrahilar mass has
decreased in size and remains contiguous with left hilar and AP
window nodal adenopathy. There is improved aeration of the left
lung. No new or enlarging pulmonary nodules.

Musculoskeletal/Chest wall: No chest wall mass or suspicious osseous
findings.

CT ABDOMEN AND PELVIS FINDINGS

Hepatobiliary: The liver is normal in density without suspicious
focal abnormality. Stable probable small cystic lesion centrally in
the right hepatic lobe on image 59/2. Previous cholecystectomy and
pneumobilia attributed to reported previous Whipple procedure. No
biliary dilatation.

Pancreas: Stable postsurgical changes in the pancreatic head
consistent with previous Whipple procedure. The pancreatic body and
tail appears stable without ductal dilatation or surrounding
inflammation.

Spleen: Normal in size without focal abnormality.

Adrenals/Urinary Tract: Stable 2.7 x 1.8 cm left adrenal nodule on
image 59/2 from 6452, consistent with an incidental adenoma. No
right adrenal nodule. Several small left renal cysts. No evidence of
enhancing renal mass, urinary tract calculus or hydronephrosis. The
bladder appears unremarkable.

Stomach/Bowel: Postsurgical changes in the stomach and proximal
small bowel attributed to previous Whipple procedure, grossly
stable. No bowel wall thickening, distention or surrounding
inflammation. The appendix appears normal. There are mild
diverticular changes throughout the distal colon without evidence of
acute inflammation.

Vascular/Lymphatic: Interval improvement in previously demonstrated
hypermetabolic upper retroperitoneal lymphadenopathy. There is
remaining small node posterior to the left renal artery, measuring
1.3 x 0.9 cm on image 64/2 (previously 1.9 x 1.4 cm). No progressive
adenopathy. Aortic and branch vessel atherosclerosis without acute
vascular findings. The portal, superior mesenteric and splenic veins
are patent.

Reproductive: Hysterectomy.  No adnexal mass.

Other: Intact abdominal wall. No ascites or peritoneal nodularity.
Numerous cutaneous and subcutaneous nodules are present consistent
with neurofibromatosis.

Musculoskeletal: No acute or significant osseous findings. Stable
degenerative changes in the spine.
IMPRESSION: 1. Interval response to therapy with decreased size of left
infrahilar mass, improved left pleural metastases and improved
mediastinal and upper abdominal adenopathy. No progressive
metastatic disease identified.
2. Decreased left pleural effusion with improved aeration of the
left lung.
3. Stable postsurgical changes status post Whipple procedure.
4. Stable left adrenal adenoma.
5. Numerous cutaneous and subcutaneous nodules consistent with
neurofibromatosis.
6. Aortic Atherosclerosis (84TEG-EJH.H) and Emphysema (84TEG-VEN.Y).
